# Patient Record
Sex: Male | Born: 1940
Health system: Southern US, Community
[De-identification: ages and names within clinical notes are randomized; demographics above are authoritative.]

## PROBLEM LIST (undated history)

## (undated) DIAGNOSIS — I2510 Atherosclerotic heart disease of native coronary artery without angina pectoris: Secondary | ICD-10-CM

## (undated) DIAGNOSIS — I6529 Occlusion and stenosis of unspecified carotid artery: Secondary | ICD-10-CM

## (undated) DIAGNOSIS — N200 Calculus of kidney: Secondary | ICD-10-CM

## (undated) DIAGNOSIS — I10 Essential (primary) hypertension: Secondary | ICD-10-CM

## (undated) DIAGNOSIS — K219 Gastro-esophageal reflux disease without esophagitis: Secondary | ICD-10-CM

## (undated) DIAGNOSIS — M1990 Unspecified osteoarthritis, unspecified site: Secondary | ICD-10-CM

## (undated) DIAGNOSIS — Z87442 Personal history of urinary calculi: Secondary | ICD-10-CM

## (undated) DIAGNOSIS — D509 Iron deficiency anemia, unspecified: Secondary | ICD-10-CM

## (undated) DIAGNOSIS — C801 Malignant (primary) neoplasm, unspecified: Secondary | ICD-10-CM

## (undated) DIAGNOSIS — I219 Acute myocardial infarction, unspecified: Secondary | ICD-10-CM

## (undated) DIAGNOSIS — J45909 Unspecified asthma, uncomplicated: Secondary | ICD-10-CM

## (undated) DIAGNOSIS — E785 Hyperlipidemia, unspecified: Secondary | ICD-10-CM

## (undated) HISTORY — PX: EYE SURGERY: SHX253

## (undated) HISTORY — PX: COLONOSCOPY: SHX174

## (undated) HISTORY — PX: COLON SURGERY: SHX602

## (undated) HISTORY — DX: Acute myocardial infarction, unspecified: I21.9

## (undated) HISTORY — DX: Calculus of kidney: N20.0

## (undated) HISTORY — DX: Essential (primary) hypertension: I10

## (undated) HISTORY — DX: Unspecified osteoarthritis, unspecified site: M19.90

## (undated) HISTORY — DX: Occlusion and stenosis of unspecified carotid artery: I65.29

## (undated) HISTORY — DX: Iron deficiency anemia, unspecified: D50.9

## (undated) HISTORY — PX: REPAIR OF COMPLEX TRACTION RETINAL DETACHMENT: SHX6217

## (undated) HISTORY — DX: Hyperlipidemia, unspecified: E78.5

## (undated) HISTORY — DX: Atherosclerotic heart disease of native coronary artery without angina pectoris: I25.10

## (undated) HISTORY — PX: CORONARY STENT PLACEMENT: SHX1402

## (undated) HISTORY — DX: Unspecified asthma, uncomplicated: J45.909

## (undated) HISTORY — PX: HERNIA REPAIR: SHX51

---

## 1999-06-13 ENCOUNTER — Ambulatory Visit (HOSPITAL_COMMUNITY): Admission: RE | Admit: 1999-06-13 | Discharge: 1999-06-13 | Payer: Self-pay | Admitting: Gastroenterology

## 1999-06-13 ENCOUNTER — Encounter (INDEPENDENT_AMBULATORY_CARE_PROVIDER_SITE_OTHER): Payer: Self-pay | Admitting: Specialist

## 2000-04-26 ENCOUNTER — Emergency Department (HOSPITAL_COMMUNITY): Admission: EM | Admit: 2000-04-26 | Discharge: 2000-04-26 | Payer: Self-pay | Admitting: Emergency Medicine

## 2000-04-26 ENCOUNTER — Encounter: Payer: Self-pay | Admitting: Emergency Medicine

## 2000-04-27 ENCOUNTER — Encounter: Payer: Self-pay | Admitting: Emergency Medicine

## 2000-12-10 ENCOUNTER — Ambulatory Visit (HOSPITAL_COMMUNITY): Admission: RE | Admit: 2000-12-10 | Discharge: 2000-12-10 | Payer: Self-pay | Admitting: Family Medicine

## 2000-12-10 ENCOUNTER — Encounter: Payer: Self-pay | Admitting: Family Medicine

## 2000-12-22 ENCOUNTER — Encounter: Payer: Self-pay | Admitting: Family Medicine

## 2000-12-22 ENCOUNTER — Ambulatory Visit (HOSPITAL_COMMUNITY): Admission: RE | Admit: 2000-12-22 | Discharge: 2000-12-22 | Payer: Self-pay | Admitting: Family Medicine

## 2001-07-06 ENCOUNTER — Ambulatory Visit (HOSPITAL_COMMUNITY): Admission: RE | Admit: 2001-07-06 | Discharge: 2001-07-06 | Payer: Self-pay | Admitting: Gastroenterology

## 2001-07-06 ENCOUNTER — Encounter (INDEPENDENT_AMBULATORY_CARE_PROVIDER_SITE_OTHER): Payer: Self-pay

## 2001-07-08 ENCOUNTER — Observation Stay (HOSPITAL_COMMUNITY): Admission: EM | Admit: 2001-07-08 | Discharge: 2001-07-09 | Payer: Self-pay | Admitting: *Deleted

## 2003-08-08 DIAGNOSIS — I2510 Atherosclerotic heart disease of native coronary artery without angina pectoris: Secondary | ICD-10-CM

## 2003-08-08 HISTORY — DX: Atherosclerotic heart disease of native coronary artery without angina pectoris: I25.10

## 2003-09-23 ENCOUNTER — Ambulatory Visit (HOSPITAL_COMMUNITY): Admission: RE | Admit: 2003-09-23 | Discharge: 2003-09-23 | Payer: Self-pay | Admitting: Internal Medicine

## 2003-09-30 ENCOUNTER — Encounter: Admission: RE | Admit: 2003-09-30 | Discharge: 2003-09-30 | Payer: Self-pay | Admitting: Vascular Surgery

## 2003-10-17 ENCOUNTER — Encounter (HOSPITAL_COMMUNITY): Admission: RE | Admit: 2003-10-17 | Discharge: 2004-01-15 | Payer: Self-pay | Admitting: Interventional Cardiology

## 2003-11-08 ENCOUNTER — Emergency Department (HOSPITAL_COMMUNITY): Admission: EM | Admit: 2003-11-08 | Discharge: 2003-11-08 | Payer: Self-pay | Admitting: Emergency Medicine

## 2003-11-16 ENCOUNTER — Ambulatory Visit (HOSPITAL_COMMUNITY): Admission: RE | Admit: 2003-11-16 | Discharge: 2003-11-16 | Payer: Self-pay | Admitting: Internal Medicine

## 2003-11-30 ENCOUNTER — Encounter (INDEPENDENT_AMBULATORY_CARE_PROVIDER_SITE_OTHER): Payer: Self-pay | Admitting: Specialist

## 2003-11-30 ENCOUNTER — Encounter (INDEPENDENT_AMBULATORY_CARE_PROVIDER_SITE_OTHER): Payer: Self-pay | Admitting: General Surgery

## 2003-11-30 ENCOUNTER — Inpatient Hospital Stay (HOSPITAL_COMMUNITY): Admission: RE | Admit: 2003-11-30 | Discharge: 2003-12-07 | Payer: Self-pay | Admitting: General Surgery

## 2003-12-06 ENCOUNTER — Ambulatory Visit: Payer: Self-pay | Admitting: Oncology

## 2003-12-27 ENCOUNTER — Inpatient Hospital Stay (HOSPITAL_COMMUNITY): Admission: EM | Admit: 2003-12-27 | Discharge: 2003-12-29 | Payer: Self-pay | Admitting: Oncology

## 2003-12-27 ENCOUNTER — Ambulatory Visit: Payer: Self-pay | Admitting: Oncology

## 2004-02-15 ENCOUNTER — Ambulatory Visit: Payer: Self-pay | Admitting: Oncology

## 2004-02-16 ENCOUNTER — Encounter (INDEPENDENT_AMBULATORY_CARE_PROVIDER_SITE_OTHER): Payer: Self-pay | Admitting: *Deleted

## 2004-02-16 ENCOUNTER — Ambulatory Visit: Payer: Self-pay | Admitting: Oncology

## 2004-02-16 ENCOUNTER — Ambulatory Visit (HOSPITAL_COMMUNITY): Admission: RE | Admit: 2004-02-16 | Discharge: 2004-02-16 | Payer: Self-pay | Admitting: Oncology

## 2004-06-08 ENCOUNTER — Ambulatory Visit: Payer: Self-pay | Admitting: Oncology

## 2004-06-13 ENCOUNTER — Ambulatory Visit (HOSPITAL_COMMUNITY): Admission: RE | Admit: 2004-06-13 | Discharge: 2004-06-13 | Payer: Self-pay | Admitting: Oncology

## 2004-11-09 ENCOUNTER — Ambulatory Visit: Payer: Self-pay | Admitting: Oncology

## 2005-03-11 ENCOUNTER — Ambulatory Visit: Payer: Self-pay | Admitting: Oncology

## 2005-03-14 ENCOUNTER — Ambulatory Visit (HOSPITAL_COMMUNITY): Admission: RE | Admit: 2005-03-14 | Discharge: 2005-03-14 | Payer: Self-pay | Admitting: Oncology

## 2005-05-07 ENCOUNTER — Ambulatory Visit: Payer: Self-pay | Admitting: Oncology

## 2005-05-08 LAB — COMPREHENSIVE METABOLIC PANEL
AST: 18 U/L (ref 0–37)
Albumin: 4.2 g/dL (ref 3.5–5.2)
Alkaline Phosphatase: 63 U/L (ref 39–117)
BUN: 20 mg/dL (ref 6–23)
Glucose, Bld: 111 mg/dL — ABNORMAL HIGH (ref 70–99)
Potassium: 4.7 mEq/L (ref 3.5–5.3)
Sodium: 136 mEq/L (ref 135–145)
Total Bilirubin: 0.4 mg/dL (ref 0.3–1.2)

## 2005-05-08 LAB — CBC WITH DIFFERENTIAL/PLATELET
Basophils Absolute: 0 10*3/uL (ref 0.0–0.1)
EOS%: 0.6 % (ref 0.0–7.0)
Eosinophils Absolute: 0 10*3/uL (ref 0.0–0.5)
LYMPH%: 31.4 % (ref 14.0–48.0)
MCH: 29.6 pg (ref 28.0–33.4)
MCV: 88 fL (ref 81.6–98.0)
MONO%: 9.1 % (ref 0.0–13.0)
Platelets: 217 10*3/uL (ref 145–400)
RBC: 4.81 10*6/uL (ref 4.20–5.71)
RDW: 13.9 % (ref 11.2–14.6)

## 2005-05-08 LAB — LIPID PANEL
Cholesterol: 139 mg/dL (ref 0–200)
HDL: 31 mg/dL — ABNORMAL LOW (ref >39)
LDL Cholesterol: 89 mg/dL (ref 0–99)
Total CHOL/HDL Ratio: 4.5 Ratio
Triglycerides: 97 mg/dL (ref <150)
VLDL: 19 mg/dL (ref 0–40)

## 2005-10-25 ENCOUNTER — Ambulatory Visit: Payer: Self-pay | Admitting: Oncology

## 2005-10-29 LAB — COMPREHENSIVE METABOLIC PANEL
ALT: 23 U/L (ref 0–40)
AST: 20 U/L (ref 0–37)
Albumin: 3.9 g/dL (ref 3.5–5.2)
Alkaline Phosphatase: 59 U/L (ref 39–117)
Calcium: 9.1 mg/dL (ref 8.4–10.5)
Chloride: 106 mEq/L (ref 96–112)
Potassium: 4.6 mEq/L (ref 3.5–5.3)
Sodium: 139 mEq/L (ref 135–145)

## 2005-10-29 LAB — CBC WITH DIFFERENTIAL/PLATELET
BASO%: 0.6 % (ref 0.0–2.0)
EOS%: 0.7 % (ref 0.0–7.0)
HGB: 13.5 g/dL (ref 13.0–17.1)
MCH: 29.4 pg (ref 28.0–33.4)
MCHC: 33.3 g/dL (ref 32.0–35.9)
MONO%: 8.2 % (ref 0.0–13.0)
RBC: 4.59 10*6/uL (ref 4.20–5.71)
RDW: 14.4 % (ref 11.2–14.6)
lymph#: 1.3 10*3/uL (ref 0.9–3.3)

## 2006-04-03 ENCOUNTER — Ambulatory Visit: Payer: Self-pay | Admitting: Oncology

## 2006-04-07 ENCOUNTER — Ambulatory Visit (HOSPITAL_COMMUNITY): Admission: RE | Admit: 2006-04-07 | Discharge: 2006-04-07 | Payer: Self-pay | Admitting: Oncology

## 2006-04-08 LAB — CBC WITH DIFFERENTIAL/PLATELET
BASO%: 0.3 % (ref 0.0–2.0)
Basophils Absolute: 0 10*3/uL (ref 0.0–0.1)
EOS%: 0.8 % (ref 0.0–7.0)
MCH: 29.1 pg (ref 28.0–33.4)
MCHC: 33.9 g/dL (ref 32.0–35.9)
MCV: 85.8 fL (ref 81.6–98.0)
MONO%: 8.4 % (ref 0.0–13.0)
RBC: 4.9 10*6/uL (ref 4.20–5.71)
RDW: 15.4 % — ABNORMAL HIGH (ref 11.2–14.6)
lymph#: 1.6 10*3/uL (ref 0.9–3.3)

## 2006-04-08 LAB — COMPREHENSIVE METABOLIC PANEL
ALT: 22 U/L (ref 0–53)
AST: 17 U/L (ref 0–37)
Albumin: 4.2 g/dL (ref 3.5–5.2)
Alkaline Phosphatase: 65 U/L (ref 39–117)
BUN: 19 mg/dL (ref 6–23)
Calcium: 9.3 mg/dL (ref 8.4–10.5)
Chloride: 105 mEq/L (ref 96–112)
Creatinine, Ser: 0.91 mg/dL (ref 0.40–1.50)
Potassium: 5.1 mEq/L (ref 3.5–5.3)

## 2006-04-08 LAB — CEA: CEA: 1 ng/mL (ref 0.0–5.0)

## 2006-08-03 ENCOUNTER — Inpatient Hospital Stay (HOSPITAL_COMMUNITY): Admission: EM | Admit: 2006-08-03 | Discharge: 2006-08-05 | Payer: Self-pay | Admitting: Emergency Medicine

## 2006-08-05 ENCOUNTER — Ambulatory Visit: Payer: Self-pay | Admitting: Vascular Surgery

## 2006-11-10 ENCOUNTER — Ambulatory Visit: Payer: Self-pay | Admitting: Oncology

## 2006-11-10 LAB — CBC WITH DIFFERENTIAL/PLATELET
BASO%: 0.7 % (ref 0.0–2.0)
Basophils Absolute: 0.1 10*3/uL (ref 0.0–0.1)
EOS%: 0.5 % (ref 0.0–7.0)
HGB: 13 g/dL (ref 13.0–17.1)
MCH: 28.5 pg (ref 28.0–33.4)
MCHC: 33.6 g/dL (ref 32.0–35.9)
MONO%: 9.9 % (ref 0.0–13.0)
RBC: 4.57 10*6/uL (ref 4.20–5.71)
RDW: 12 % (ref 11.2–14.6)
lymph#: 1.5 10*3/uL (ref 0.9–3.3)

## 2006-11-10 LAB — COMPREHENSIVE METABOLIC PANEL
ALT: 14 U/L (ref 0–53)
AST: 14 U/L (ref 0–37)
Albumin: 4 g/dL (ref 3.5–5.2)
Alkaline Phosphatase: 64 U/L (ref 39–117)
Calcium: 9 mg/dL (ref 8.4–10.5)
Chloride: 107 mEq/L (ref 96–112)
Potassium: 4.9 mEq/L (ref 3.5–5.3)
Sodium: 140 mEq/L (ref 135–145)
Total Protein: 6.4 g/dL (ref 6.0–8.3)

## 2007-03-05 ENCOUNTER — Encounter: Admission: RE | Admit: 2007-03-05 | Discharge: 2007-03-05 | Payer: Self-pay | Admitting: Internal Medicine

## 2007-05-27 ENCOUNTER — Ambulatory Visit: Payer: Self-pay | Admitting: Oncology

## 2008-06-01 ENCOUNTER — Ambulatory Visit: Payer: Self-pay | Admitting: Oncology

## 2008-06-03 LAB — COMPREHENSIVE METABOLIC PANEL
ALT: 16 U/L (ref 0–53)
AST: 15 U/L (ref 0–37)
Alkaline Phosphatase: 68 U/L (ref 39–117)
CO2: 24 mEq/L (ref 19–32)
Sodium: 142 mEq/L (ref 135–145)
Total Bilirubin: 0.4 mg/dL (ref 0.3–1.2)
Total Protein: 6.3 g/dL (ref 6.0–8.3)

## 2008-06-03 LAB — MORPHOLOGY: PLT EST: ADEQUATE

## 2008-06-03 LAB — CBC WITH DIFFERENTIAL/PLATELET
Eosinophils Absolute: 0.1 10*3/uL (ref 0.0–0.5)
LYMPH%: 28.8 % (ref 14.0–49.0)
MONO#: 0.7 10*3/uL (ref 0.1–0.9)
NEUT#: 4.2 10*3/uL (ref 1.5–6.5)
Platelets: 239 10*3/uL (ref 140–400)
RBC: 4.73 10*6/uL (ref 4.20–5.82)
WBC: 7 10*3/uL (ref 4.0–10.3)

## 2008-09-05 ENCOUNTER — Ambulatory Visit: Payer: Self-pay | Admitting: Oncology

## 2008-09-05 LAB — CBC WITH DIFFERENTIAL/PLATELET
Basophils Absolute: 0 10*3/uL (ref 0.0–0.1)
Eosinophils Absolute: 0 10*3/uL (ref 0.0–0.5)
HCT: 39.2 % (ref 38.4–49.9)
HGB: 12.3 g/dL — ABNORMAL LOW (ref 13.0–17.1)
LYMPH%: 28.3 % (ref 14.0–49.0)
MONO#: 0.6 10*3/uL (ref 0.1–0.9)
NEUT#: 4 10*3/uL (ref 1.5–6.5)
Platelets: 245 10*3/uL (ref 140–400)
RBC: 5.04 10*6/uL (ref 4.20–5.82)
WBC: 6.4 10*3/uL (ref 4.0–10.3)

## 2008-09-05 LAB — COMPREHENSIVE METABOLIC PANEL
Albumin: 3.9 g/dL (ref 3.5–5.2)
BUN: 17 mg/dL (ref 6–23)
CO2: 20 mEq/L (ref 19–32)
Calcium: 9.2 mg/dL (ref 8.4–10.5)
Chloride: 107 mEq/L (ref 96–112)
Creatinine, Ser: 0.97 mg/dL (ref 0.40–1.50)
Potassium: 4.7 mEq/L (ref 3.5–5.3)

## 2008-09-05 LAB — MORPHOLOGY

## 2008-09-05 LAB — IRON AND TIBC
Iron: 42 ug/dL (ref 42–165)
UIBC: 340 ug/dL

## 2008-09-05 LAB — LACTATE DEHYDROGENASE: LDH: 136 U/L (ref 94–250)

## 2008-09-05 LAB — FERRITIN: Ferritin: 6 ng/mL — ABNORMAL LOW (ref 22–322)

## 2008-12-06 ENCOUNTER — Ambulatory Visit: Payer: Self-pay | Admitting: Oncology

## 2008-12-09 LAB — CBC WITH DIFFERENTIAL/PLATELET
BASO%: 0.5 % (ref 0.0–2.0)
EOS%: 0.7 % (ref 0.0–7.0)
HCT: 39.4 % (ref 38.4–49.9)
HGB: 12.5 g/dL — ABNORMAL LOW (ref 13.0–17.1)
MCH: 27 pg — ABNORMAL LOW (ref 27.2–33.4)
MCHC: 31.7 g/dL — ABNORMAL LOW (ref 32.0–36.0)
MONO#: 0.6 10*3/uL (ref 0.1–0.9)
RDW: 16.2 % — ABNORMAL HIGH (ref 11.0–14.6)
WBC: 5.8 10*3/uL (ref 4.0–10.3)
lymph#: 1.6 10*3/uL (ref 0.9–3.3)

## 2008-12-09 LAB — COMPREHENSIVE METABOLIC PANEL
AST: 11 U/L (ref 0–37)
Albumin: 4 g/dL (ref 3.5–5.2)
Alkaline Phosphatase: 61 U/L (ref 39–117)
BUN: 20 mg/dL (ref 6–23)
Creatinine, Ser: 0.95 mg/dL (ref 0.40–1.50)
Glucose, Bld: 130 mg/dL — ABNORMAL HIGH (ref 70–99)
Total Bilirubin: 0.4 mg/dL (ref 0.3–1.2)

## 2008-12-09 LAB — MORPHOLOGY: PLT EST: ADEQUATE

## 2009-04-05 ENCOUNTER — Encounter: Admission: RE | Admit: 2009-04-05 | Discharge: 2009-04-05 | Payer: Self-pay | Admitting: Gastroenterology

## 2009-06-08 ENCOUNTER — Ambulatory Visit: Payer: Self-pay | Admitting: Oncology

## 2009-06-09 LAB — CBC WITH DIFFERENTIAL/PLATELET
Basophils Absolute: 0 10*3/uL (ref 0.0–0.1)
Eosinophils Absolute: 0.1 10*3/uL (ref 0.0–0.5)
HCT: 40.1 % (ref 38.4–49.9)
LYMPH%: 20.9 % (ref 14.0–49.0)
MCV: 86.2 fL (ref 79.3–98.0)
MONO#: 0.5 10*3/uL (ref 0.1–0.9)
MONO%: 7.6 % (ref 0.0–14.0)
NEUT#: 4.4 10*3/uL (ref 1.5–6.5)
NEUT%: 70 % (ref 39.0–75.0)
Platelets: 230 10*3/uL (ref 140–400)
RBC: 4.65 10*6/uL (ref 4.20–5.82)
WBC: 6.2 10*3/uL (ref 4.0–10.3)

## 2009-06-09 LAB — LACTATE DEHYDROGENASE: LDH: 117 U/L (ref 94–250)

## 2009-06-09 LAB — COMPREHENSIVE METABOLIC PANEL
ALT: 11 U/L (ref 0–53)
Albumin: 3.9 g/dL (ref 3.5–5.2)
CO2: 25 mEq/L (ref 19–32)
Chloride: 107 mEq/L (ref 96–112)
Glucose, Bld: 132 mg/dL — ABNORMAL HIGH (ref 70–99)
Potassium: 4.7 mEq/L (ref 3.5–5.3)
Sodium: 139 mEq/L (ref 135–145)
Total Bilirubin: 0.4 mg/dL (ref 0.3–1.2)
Total Protein: 6.1 g/dL (ref 6.0–8.3)

## 2009-06-09 LAB — MORPHOLOGY: PLT EST: ADEQUATE

## 2009-09-25 ENCOUNTER — Encounter: Admission: RE | Admit: 2009-09-25 | Discharge: 2009-09-25 | Payer: Self-pay | Admitting: Internal Medicine

## 2009-12-22 ENCOUNTER — Ambulatory Visit: Payer: Self-pay | Admitting: Oncology

## 2010-01-25 ENCOUNTER — Ambulatory Visit: Payer: Self-pay | Admitting: Oncology

## 2010-01-28 ENCOUNTER — Encounter: Payer: Self-pay | Admitting: Oncology

## 2010-01-29 LAB — COMPREHENSIVE METABOLIC PANEL
ALT: 14 U/L (ref 0–53)
AST: 16 U/L (ref 0–37)
Albumin: 3.9 g/dL (ref 3.5–5.2)
Alkaline Phosphatase: 60 U/L (ref 39–117)
BUN: 13 mg/dL (ref 6–23)
CO2: 24 mEq/L (ref 19–32)
Calcium: 9.1 mg/dL (ref 8.4–10.5)
Chloride: 106 mEq/L (ref 96–112)
Creatinine, Ser: 0.91 mg/dL (ref 0.40–1.50)
Glucose, Bld: 132 mg/dL — ABNORMAL HIGH (ref 70–99)
Potassium: 4.3 mEq/L (ref 3.5–5.3)
Sodium: 139 mEq/L (ref 135–145)
Total Bilirubin: 0.4 mg/dL (ref 0.3–1.2)
Total Protein: 6 g/dL (ref 6.0–8.3)

## 2010-01-29 LAB — URINALYSIS, MICROSCOPIC - CHCC
Bilirubin (Urine): NEGATIVE
Blood: NEGATIVE
Glucose: NEGATIVE g/dL
Ketones: NEGATIVE mg/dL
Leukocyte Esterase: NEGATIVE
Nitrite: NEGATIVE
Protein: NEGATIVE mg/dL
Specific Gravity, Urine: 1.03 (ref 1.003–1.035)
pH: 6 (ref 4.6–8.0)

## 2010-01-29 LAB — MORPHOLOGY: PLT EST: ADEQUATE

## 2010-01-29 LAB — IRON AND TIBC
%SAT: 11 % — ABNORMAL LOW (ref 20–55)
Iron: 38 ug/dL — ABNORMAL LOW (ref 42–165)
TIBC: 340 ug/dL (ref 215–435)
UIBC: 302 ug/dL

## 2010-01-29 LAB — CBC WITH DIFFERENTIAL/PLATELET
BASO%: 0.5 % (ref 0.0–2.0)
Basophils Absolute: 0 10*3/uL (ref 0.0–0.1)
EOS%: 0.7 % (ref 0.0–7.0)
Eosinophils Absolute: 0 10*3/uL (ref 0.0–0.5)
HCT: 37.5 % — ABNORMAL LOW (ref 38.4–49.9)
HGB: 12.4 g/dL — ABNORMAL LOW (ref 13.0–17.1)
LYMPH%: 25.1 % (ref 14.0–49.0)
MCH: 27.5 pg (ref 27.2–33.4)
MCHC: 33 g/dL (ref 32.0–36.0)
MCV: 83.6 fL (ref 79.3–98.0)
MONO#: 0.5 10*3/uL (ref 0.1–0.9)
MONO%: 7.7 % (ref 0.0–14.0)
NEUT#: 4 10*3/uL (ref 1.5–6.5)
NEUT%: 66 % (ref 39.0–75.0)
Platelets: 227 10*3/uL (ref 140–400)
RBC: 4.49 10*6/uL (ref 4.20–5.82)
RDW: 16.3 % — ABNORMAL HIGH (ref 11.0–14.6)
WBC: 6 10*3/uL (ref 4.0–10.3)
lymph#: 1.5 10*3/uL (ref 0.9–3.3)

## 2010-01-29 LAB — LACTATE DEHYDROGENASE: LDH: 110 U/L (ref 94–250)

## 2010-05-03 ENCOUNTER — Other Ambulatory Visit: Payer: Self-pay | Admitting: Urology

## 2010-05-03 ENCOUNTER — Other Ambulatory Visit (HOSPITAL_COMMUNITY): Payer: Self-pay | Admitting: Urology

## 2010-05-03 ENCOUNTER — Other Ambulatory Visit: Payer: Self-pay | Admitting: Anesthesiology

## 2010-05-03 ENCOUNTER — Encounter (HOSPITAL_COMMUNITY): Payer: Medicare Other

## 2010-05-03 ENCOUNTER — Ambulatory Visit (HOSPITAL_COMMUNITY)
Admission: RE | Admit: 2010-05-03 | Discharge: 2010-05-03 | Disposition: A | Discharge status: Home / Self Care | Payer: Medicare Other | Source: Ambulatory Visit | Attending: Urology | Admitting: Urology

## 2010-05-03 DIAGNOSIS — N201 Calculus of ureter: Secondary | ICD-10-CM | POA: Insufficient documentation

## 2010-05-03 DIAGNOSIS — Z01818 Encounter for other preprocedural examination: Secondary | ICD-10-CM | POA: Insufficient documentation

## 2010-05-03 DIAGNOSIS — Z01810 Encounter for preprocedural cardiovascular examination: Secondary | ICD-10-CM | POA: Insufficient documentation

## 2010-05-03 DIAGNOSIS — I10 Essential (primary) hypertension: Secondary | ICD-10-CM | POA: Insufficient documentation

## 2010-05-03 DIAGNOSIS — E119 Type 2 diabetes mellitus without complications: Secondary | ICD-10-CM | POA: Insufficient documentation

## 2010-05-03 DIAGNOSIS — Z01812 Encounter for preprocedural laboratory examination: Secondary | ICD-10-CM | POA: Insufficient documentation

## 2010-05-03 LAB — SURGICAL PCR SCREEN
MRSA, PCR: INVALID — AB
Staphylococcus aureus: INVALID — AB

## 2010-05-03 LAB — BASIC METABOLIC PANEL
BUN: 14 mg/dL (ref 6–23)
Calcium: 9 mg/dL (ref 8.4–10.5)
GFR calc non Af Amer: 39 mL/min — ABNORMAL LOW (ref >60)
Glucose, Bld: 134 mg/dL — ABNORMAL HIGH (ref 70–99)
Potassium: 4.8 mEq/L (ref 3.5–5.1)
Sodium: 138 mEq/L (ref 135–145)

## 2010-05-03 LAB — APTT: aPTT: 40 seconds — ABNORMAL HIGH (ref 24–37)

## 2010-05-03 LAB — CBC
HCT: 36.3 % — ABNORMAL LOW (ref 39.0–52.0)
MCHC: 31.4 g/dL (ref 30.0–36.0)
Platelets: 244 10*3/uL (ref 150–400)
RDW: 15.7 % — ABNORMAL HIGH (ref 11.5–15.5)
WBC: 10.3 10*3/uL (ref 4.0–10.5)

## 2010-05-03 LAB — PROTIME-INR: Prothrombin Time: 14.2 seconds (ref 11.6–15.2)

## 2010-05-04 ENCOUNTER — Ambulatory Visit (HOSPITAL_COMMUNITY)
Admission: RE | Admit: 2010-05-04 | Discharge: 2010-05-04 | Disposition: A | Discharge status: Home / Self Care | Payer: Medicare Other | Source: Ambulatory Visit | Attending: Urology | Admitting: Urology

## 2010-05-04 DIAGNOSIS — C8589 Other specified types of non-Hodgkin lymphoma, extranodal and solid organ sites: Secondary | ICD-10-CM | POA: Insufficient documentation

## 2010-05-04 DIAGNOSIS — I252 Old myocardial infarction: Secondary | ICD-10-CM | POA: Insufficient documentation

## 2010-05-04 DIAGNOSIS — I2510 Atherosclerotic heart disease of native coronary artery without angina pectoris: Secondary | ICD-10-CM | POA: Insufficient documentation

## 2010-05-04 DIAGNOSIS — Z7982 Long term (current) use of aspirin: Secondary | ICD-10-CM | POA: Insufficient documentation

## 2010-05-04 DIAGNOSIS — N201 Calculus of ureter: Secondary | ICD-10-CM | POA: Insufficient documentation

## 2010-05-04 DIAGNOSIS — Z85038 Personal history of other malignant neoplasm of large intestine: Secondary | ICD-10-CM | POA: Insufficient documentation

## 2010-05-04 DIAGNOSIS — Z7902 Long term (current) use of antithrombotics/antiplatelets: Secondary | ICD-10-CM | POA: Insufficient documentation

## 2010-05-04 DIAGNOSIS — E119 Type 2 diabetes mellitus without complications: Secondary | ICD-10-CM | POA: Insufficient documentation

## 2010-05-04 DIAGNOSIS — I6529 Occlusion and stenosis of unspecified carotid artery: Secondary | ICD-10-CM | POA: Insufficient documentation

## 2010-05-04 DIAGNOSIS — Z85828 Personal history of other malignant neoplasm of skin: Secondary | ICD-10-CM | POA: Insufficient documentation

## 2010-05-04 DIAGNOSIS — Z79899 Other long term (current) drug therapy: Secondary | ICD-10-CM | POA: Insufficient documentation

## 2010-05-04 DIAGNOSIS — I739 Peripheral vascular disease, unspecified: Secondary | ICD-10-CM | POA: Insufficient documentation

## 2010-05-04 LAB — GLUCOSE, CAPILLARY
Glucose-Capillary: 138 mg/dL — ABNORMAL HIGH (ref 70–99)
Glucose-Capillary: 139 mg/dL — ABNORMAL HIGH (ref 70–99)

## 2010-05-06 LAB — MRSA CULTURE

## 2010-05-08 DIAGNOSIS — N200 Calculus of kidney: Secondary | ICD-10-CM

## 2010-05-08 HISTORY — DX: Calculus of kidney: N20.0

## 2010-05-08 NOTE — Op Note (Signed)
NAME:  Mark West, Mark West NO.:  [id number]  MEDICAL RECORD NO.:  [id number]           PATIENT TYPE:  O  LOCATION:  XRAY                         FACILITY:  [facility]  PHYSICIAN:  Betul Brisky C. Vernie Ammons, M.D.  DATE OF BIRTH:  14-Sep-1940  DATE OF PROCEDURE: DATE OF DISCHARGE:                              OPERATIVE REPORT   PREOPERATIVE DIAGNOSIS:  Left ureteral calculus.  POSTOPERATIVE DIAGNOSIS:  Left ureteral calculus.  PROCEDURES: 1. Cystoscopy with left retrograde pyelogram including interpretation. 2. Left ureteroscopy. 3. Laser lithotripsy. 4. Ureteroscopic stone extraction. 5. Left double-J stent placement.  SURGEON:  Kosha Jaquith C. Vernie Ammons, M.D.  ANESTHESIA:  General.  BLOOD LOSS:  Minimal.  DRAINS:  5-French, 26-cm Polaris stent (string attached).  SPECIMEN:  Stone given to the patient.  COMPLICATIONS:  None.  INDICATIONS:  The patient is 70 year old male with a diagnosis of left ureteral calculus, who initially had severe left flank pain and was seen in an emergency room in [place] where a 4-mm left ureteral stone was noted.  He was placed on medical expulsive therapy and followed up with me, continuing to have pain, and we therefore discussed the treatment options.  His stone could not be definitively visualized on KUB and therefore, ureteroscopy was recommended.  I have gone over the procedure with him in detail including the risks and complications.  He understands and has elected to proceed.  DESCRIPTION OF OPERATION:  After informed consent, the patient was brought to the major OR, placed on table, administered general anesthesia and then moved to the dorsal lithotomy position.  His genitalia was sterilely prepped and draped with Hibiclens since he does have an IODINE allergy.  An official time-out was then performed.  The 22-French rigid cystoscope with 12-degree lens was then passed under direct vision down the urethra which was noted to be entirely  normal. The prostatic urethra revealed some trilobar hypertrophy but no prostatic lesions were identified.  The bladder was then entered and fully inspected in a systematic fashion and noted to be free of any tumor, stones or inflammatory lesions.  Ureteral orifices were of normal configuration and position.  The left ureteral orifice was identified and a 6-French open-ended ureteral catheter was then passed through the cystoscope and into the left ureteral orifice.  A left retrograde pyelogram was then performed in standard fashion.  I injected full strength contrast through the open- ended catheter and up the left ureter under direct fluoroscopic visualization, noting a filling defect in the distal ureter and some proximal dilatation of the ureter but no proximal filling defects and the intrarenal collecting system was also noted be normal.  I, therefore, passed a 0.038-inch floppy tip guidewire through the open- ended catheter in to the area of the renal pelvis under fluoroscopy and then dilated the intramural ureter initially with the inner portion of the ureteral access sheath followed by the outer portion together with the inner portion, which gently dilated the intramural ureter.  6-French rigid ureteroscope was then passed under direct vision into the left ureteral orifice and easily up the ureter a short distance where the stone was identified.  I passed the  BackStop catheter beyond the stone about 1 cm and then injected full 10 mL of BackStop material to prevent proximal migration of the stone and fragments during treatment. I then used a 200 micron holmium laser fiber to fragment the stone.  The nitinol basket was then used to grasp all the stone fragments and extract those.  Reinspection of the ureter revealed no injury and because of stent was placed, the BackStop material was not dissolved at the time of surgery.  The cystoscope was back loaded over the guidewire and the  Polaris stent was then passed over the guidewire with good curl being noted in the renal pelvis, as I removed the guidewire.  The bladder was then drained, the cystoscope removed and the tether on the distal aspect of the stent was affixed to the dorsum of the penis.  The patient was awakened and taken to recovery room in stable and satisfactory condition.  He tolerated procedure well and there are no intraoperative complications. He will be given a prescription for Pyridium 200 mg #30 and Vicodin HP #30 with followup in my office for stent removal in 1 week.  Written discharge instructions were given as well.     Samarrah Tranchina C. Vernie Ammons, M.D.     MCO/MEDQ  D:  05/04/2010  T:  05/04/2010  Job:  [id number]  Electronically Signed by Ihor Gully M.D. on 05/08/2010 04:12:37 AM

## 2010-05-22 NOTE — H&P (Signed)
NAME:  Mark West, PETRENKO NO.:  [id number]   MEDICAL RECORD NO.:  [id number]          PATIENT TYPE:  INP   LOCATION:  1406                         FACILITY:  [facility]   PHYSICIAN:  Hollice Espy, M.D.DATE OF BIRTH:  26-May-1940   DATE OF ADMISSION:  08/03/2006  DATE OF DISCHARGE:                              HISTORY & PHYSICAL   PRIMARY CARE PHYSICIAN:  Georgann Housekeeper, M.D.   CHIEF COMPLAINT:  Syncope.   HISTORY OF PRESENT ILLNESS:  The patient is a 70 year old white male  with a past medical history of colon cancer, treated, low-grade  lymphoma, right side complete carotid artery stenosis, coronary artery  disease, and diabetes mellitus, who has been in generally good health  although he states for the last few days he has been having problems  where he feels a little unsteady on his feet.  He has had no previous  syncopal episodes.  Today he then woke up and said that he was feeling  very shaky and not too solid on his feet.  He did, however, take all of  his medications including his blood pressure medications.  He tells me  he occasionally checks his blood sugars and they have usually been in  the 120s.  His appetite has been good.  He then went out this afternoon  to play golf and was out in the hot sun and said that he kept noticing  that he was sweating profusely, more so than usual, telling his friend  that he was quite hot and at some point in the afternoon he slumped over  and passed out.  He said he had no immediate warning signs that he was  about to pass out but he did notice a few hours before he passed out  that he had some problems with blurry vision in his right eye.  His  friends immediately tended to him they called for help and poured water  on him.  I spoke to one of his friends who was with him and he said that  there was no way that he was out for more than 30 seconds.  There was no  shaking while he was unconscious, no loss of bowel or  bladder and when  he came to he seemed to be relatively alert and oriented.   The patient was then brought over by EMS.  Reportedly when he came into  the emergency room he was hypotensive with a blood pressure of 83/48 and  he was started on IV fluids.  The rest of his laboratory studies are  essentially unremarkable although it is noted that during his time of  his hypotension he was not tachycardic but the heart rate initially was  78 though again noted he is on metoprolol.  He was given IV fluids.  The  laboratory studies were ordered on the patient and the only laboratory  studies of concern were a normal white count but with an 80% shift and  elevated BUN of 24 and a creatinine of 1.7.  The patient had a normal  albumin.  The rest of his laboratory work  was unremarkable, however, he  was noted to have a slightly elevated D-Dimer of 0.6.  [facility] were called to evaluate the patient and for possible  admission.  A VQ scan was ordered by the emergency room attending and  when the hospitalist arrived at approximately 6:30 to admit the patient,  the patient was being taken over to the VQ scanner.  The daytime  hospitalist then decided to follow up with the patient after the VQ scan  and called the nighttime hospitalist who is myself to see the patient  after he returned.   After the patient returned, he has been since then fully alert and  oriented with no complaints other than some generalized fatigue.  He  denies any headaches, vision changes, dysphagia, chest pain,  palpitation, shortness of breath, wheezing, coughing, abdominal pain,  hematuria, dysuria, constipation, diarrhea, focal extremity numbness,  weakness, or pain.  The review of systems is otherwise negative.  He  tells me that he has been unable to void but not because he feels like  he has to go but because he just does not feel like he has to go.  He  has been unable to produce a urine specimen.   PAST  MEDICAL HISTORY:  The patient's past medical history includes a  complete right sided carotid artery stenosis, coronary artery disease,  status post coronary artery bypass graft, and a recent stent three years  ago, history of prostate nodule, history of ventral hernia repair with  mesh, history of colon cancer, status post resection and chemotherapy,  low-grade follicular lymphoma, being currently monitored, history of  hypertension, and diabetes mellitus as well as a history of a  penetrating trauma to the right eye as a child which has left him with  problems with decreased vision in that eye.   MEDICATIONS:  He is on metformin 500 p.o. b.i.d., aspirin, Plavix,  Lipitor, lisinopril, and metoprolol b.i.d.   ALLERGIES:  He has allergies to IV contrast.   SOCIAL HISTORY:  He denies any tobacco, heavy alcohol, or drug use.   FAMILY HISTORY:  The family history is noncontributory.   PHYSICAL EXAMINATION:  VITAL SIGNS:  The patient's vital signs on  admission showed a temperature of 98.9, heart rate of 78, blood pressure  of 83/48 initially, over time with fluids his blood pressure has come up  four or five hours later to 120/55, respirations of 18, O2 saturation of  98% on room air.  GENERAL:  In general the patient is alert and oriented times three,  currently in no apparent distress.  HEENT:  Normocephalic, atraumatic, his mucous membranes are dry.  He has  no carotid bruits.  HEART:  His heart is a regular rate and rhythm, S1 and S2 but very soft.  LUNGS:  His lungs are clear to auscultation bilaterally.  ABDOMEN:  The abdomen is soft, nontender, obese, nondistended, positive  bowel sounds.  EXTREMITIES:  The extremities show no clubbing, cyanosis, trace pitting  edema, 1+ peripheral pulses.   LABORATORY WORK:  Sodium of 138, potassium of 5.5, chloride of 107,  bicarb of 23, BUN of 24, creatinine of 1.7, glucose of 172.  LFTs are  unremarkable.  He has a white count of 9.9 but  with an 80% shift.  H and  H is 14.2 and 42, MCV of 85, platelet count of 260.  CPK is 79, MB less  than 1, troponin I less than 0.05.  D-Dimer is 0.6.  PT  of 13.2, INR of  1, PTT of 26, CPK of 85.  His chest x-ray is completely unremarkable.  His VQ scan done for his contrast allergies also was completely  unremarkable, low probability of a pulmonary embolus.  His EKG is not on  the chart but is reported by the emergency room attending as a normal  sinus rhythm.  According to rhythm strips he is in a normal sinus  rhythm.   ASSESSMENT AND PLAN:  1. Syncope:  The most likely thing is that the syncope is caused by      dehydration, probably worsened by the fact that he took his blood      pressure medications.  Will plan to treat with IV hydration,      orthostatic blood pressures q. shift.  Will also plan to find an      underlying cause as because of his noted differential on his white      count, he may have a urinary tract infection.  Once he is hydrated,      will check a urine specimen.  2. Acute renal failure, please see number one.  3. Diabetes mellitus:  No reports of hypoglycemia by paramedics.  Will      check a hemoglobin A1C, put on sliding scale, continue metformin.  4. Coronary artery disease:  Will hold his antihypertensives until his      blood pressure is normalized, continue      aspirin and Plavix.  5. History of right sided carotid artery stenosis:  To be completely      thorough will check a left sided carotid artery Doppler.      Hollice Espy, M.D.  Electronically Signed     SKK/MEDQ  D:  08/03/2006  T:  08/03/2006  Job:  [id number]   cc:   Hollice Espy, M.D.  Georgann Housekeeper, MD

## 2010-05-22 NOTE — Discharge Summary (Signed)
NAME:  BREVAN, LUBERTO NO.:  [id number]   MEDICAL RECORD NO.:  [id number]          PATIENT TYPE:  INP   LOCATION:  1406                         [facility]AC[facility]   PHYSICIAN:  Theressa Millard, M.D.    DATE OF BIRTH:  1940/09/05   DATE OF ADMISSION:  08/03/2006  DATE OF DISCHARGE:  08/05/2006                               DISCHARGE SUMMARY   ADMITTING DIAGNOSIS:  Syncope.   DISCHARGE DIAGNOSES:  1. Syncope secondary to dehydration.  2. Diabetes mellitus.  3. Hypertension.  4. History of colon cancer.  5. History of low grade follicular lymphoma.   The patient is a 70 year old white male who had a syncopal episode on  the golf [place] on the day of admission.  He felt hot much of the day,  was sweating profusely and without much warning had an episode of  syncope.  Does report that when he stands up in the morning, getting out  of bed, he often feels lightheaded for a few moments and this can happen  at other times during the day when he gets up from a sitting position.   HOSPITAL COURSE:  The patient was admitted.  Initial laboratory data  showed mild evidence of dehydration with a BUN of 24 and creatinine of  1.7.  With IV fluid these improved to 20 and 1.05, respectively.  Initially, his hemoglobin was 14.2 and fell to 12.5 with rehydration.  With vigorous rehydration, the patient felt much better.  IV fluids were  discontinued.  The patient ambulated without problem.  He felt a much  better and had no orthostatic dizziness.  At the time of admission, his  metoprolol and lisinopril were held.  At discharge lisinopril will be  resumed but metoprolol will be held until seen in follow-up by his  physician, Dr. Donette Larry.   We obtained a carotid Doppler to be sure that his left carotid artery  was without problem and no lesions were noted.  He does have a total  right carotid occlusion.   DISCHARGE MEDICATIONS:  1. Lipitor 40 mg daily.  2. Plavix 75 mg daily.  3.  Lisinopril 20 mg daily.  4. Aspirin 325 mg daily.  5. Metformin 500 mg daily.  Hold metoprolol for now.   DISCHARGE INSTRUCTIONS:  1. Activity:  No restrictions.  He is not to drive out of town for at      least 48 hours to be sure that he has no      recurring symptoms at home.  2. Diet:  No added salt.   FOLLOW UP:  He will call to make an appointment in 2-3 weeks to see Dr.  Donette Larry.      Theressa Millard, M.D.  Electronically Signed     JO/MEDQ  D:  08/05/2006  T:  08/05/2006  Job:  [id number]

## 2010-05-25 NOTE — Procedures (Signed)
[facility]  Patient:    Mark West, Mark West Visit Number: [id number] MRN: [id number]          Service Type: END Location: ENDO Attending Physician:  Louie Bun Dictated by:   Everardo All Madilyn Fireman, M.D. Proc. Date: 07/06/01 Admit Date:  07/06/2001 Discharge Date: 07/06/2001   CC:         Arvella Merles, M.D.   Procedure Report  PROCEDURE:  Flexible sigmoidoscopy with polypectomy.  INDICATIONS FOR PROCEDURE:  History of colon cancer due for surveillance.  DESCRIPTION OF PROCEDURE:  The patient was placed in the left lateral decubitus position then placed on the pulse monitor with continuous low flow oxygen delivered by nasal cannula. He was sedated with 60 mg IV Demerol and 6 mg IV Versed. The Olympus video colonoscope was inserted into the rectum and advanced to the cecum and then advanced to the ileocolonic anastomosis at approximately 60 cm. The terminal ileum was explored for several centimeters and appeared to be within normal limits. The prep was good. At the surgical anastomosis along an anastomotic ridge, there was a 1.2 cm friable polypoid structure that was felt by its location to be likely granulation tissue. It was removed by snare in one piece and sent for histology. The remainder of the descending, sigmoid, and rectum appeared normal all the way down to the anus with no further polyps, masses, diverticula or other mucosal abnormalities. The scope was then withdrawn and the patient returned to the recovery room in stable condition. The patient tolerated the procedure well and there were no immediate complications.  IMPRESSION:  Anastomotic polyp versus granulation tissue.  PLAN:  Await histology and will renew repeat study within at least without years. Dictated by:   Everardo All Madilyn Fireman, M.D. Attending Physician:  Louie Bun DD:  07/06/01 TD:  07/08/01 Job: [id number]

## 2010-05-25 NOTE — Op Note (Signed)
NAME:  CALI, CUARTAS NO.:  [id number]   MEDICAL RECORD NO.:  [id number]          PATIENT TYPE:  INP   LOCATION:  0002                         FACILITY:  [facility]   PHYSICIAN:  Angelia Mould. Derrell Lolling, M.D.DATE OF BIRTH:  02-Jul-1940   DATE OF PROCEDURE:  11/30/2003  DATE OF DISCHARGE:                                 OPERATIVE REPORT   PREOPERATIVE DIAGNOSIS:  Mesenteric and retroperitoneal adenopathy.   POSTOPERATIVE DIAGNOSIS:  Mesenteric and retroperitoneal adenopathy, final  pathology pending.   OPERATION PERFORMED:  1.  Exploratory laparotomy.  2.  Extensive lysis of adhesions requiring 45 minutes.  3.  Excisional biopsy of multiple mesenteric lymph nodes.   SURGEON:  Dr. Claud Kelp   FIRST ASSISTANT:  Dr. Chevis Pretty   OPERATIVE INDICATIONS:  This is a 70 year old white man in good health.  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  He received  chemotherapy.  He states that he had one positive node.  He has had no known  recurrence to date.  He recently developed right flank pain and got a CAT  scan which showed slightly enlarged celiac nodes, prominent mesenteric lymph  nodes, and some minimally enlarged retroperitoneal lymph nodes.  The right  flank pain resolved, and it became apparent that he had shingles.  He has  become asymptomatic.  On exam, he does not have any palpable mass in the  abdomen nor does he have any peripheral adenopathy.  The radiologists were  quite concerned that this represented metastatic disease or possibly  lymphoma.  The patient was offered laparotomy for clarification of his  diagnosis, and he desired to have that done.  He underwent a bowel prep at  home and is brought to the operating room electively.   OPERATIVE FINDINGS:  The patient had mesh repair of a ventral hernia, and we  had to go through that to get into the abdominal cavity.  He had extensive  chronic adhesions which were soft, and we could  take them down, but it took  almost 45 minutes to do so.  After that, we could examine the entire length  of the small bowel and find that the distal small bowel was connected to the  mid to distal sigmoid colon just above the sacral promontory.  I found 2 or  3 enlarged lymph nodes in the root of the small bowel mesentery distally.  These were at least 2-2.5 cm in size.  He had a fat necrosis of the small  bowel mesentery which I also excised.  There was no mass in the liver.  The  gallbladder felt soft and did not have any stones in it.  The hepatoduodenal  ligament felt basically normal, although there may have been some very tiny,  soft lymph nodes there as well.  There were too much adhesions for me to  feel the celiac area.  The right and left lobes of the liver felt okay,  however.  The peritoneal surface was otherwise normal.  There were no  peritoneal nodules.  The pelvis felt normal.  There was no pelvic  mass.  I  could not appreciate any iliac adenopathy either.   OPERATIVE TECHNIQUE:  Following the induction of general endotracheal  anesthesia, the patient's abdomen was prepped and draped in a sterile  fashion.  A midline incision was made, excising the old scar which was quite  wide.  Dissection was carried down through the subcutaneous tissue which was  quite scarred in.  We incised the fascia at the upper abdomen above the mesh  hernia repair, and we actually were able to enter the peritoneal space in  that area and slowly began to take adhesion down off the anterior abdominal  wall.  We slowly opened the incision, opening the mesh in the midline with a  knife, and taking adhesions down as we went.  Ultimately, we opened most of  the incision above and below the umbilicus.  We then had to spend 30-40  minutes to take down all of the adhesions and untangle the small bowel and  the ligament of Treitz all the way to the anastomosis and then down into the  rectum.  Once this was  done, exploration was carried out.  There was a small  pedunculated nodule on the mid small bowel mesentery which I excised.  Frozen section revealed fat necrosis.  I felt about three pathologically  enlarged lymph nodes in the root of the small bowel mesentery.  This was in  the more distal part of the small bowel.  I was able to incise the  peritoneum of the mesentery overlying these lymph nodes and dissected them  out.  Small vascular and lymphatic channels were ligated with 3-0 silk ties.  Both lymph nodes were about 2-2.5 cm in size; they were very soft.  They  were sent to the lab.  The pathologist performed touch preps and did not see  any evidence of cancer.  I felt that these could be reactive lymph nodes or  could be a very low-grade lymphoma.  They will be worked up for lymphoma.  I  felt that I had adequate tissue.  The pathologist stated clearly that we had  adequate tissue.  I did not see any point in dissecting any more lymph nodes  out.  We irrigated the abdomen and pelvis.  All the irrigation fluid  returned clear.  There was no bleeding.  Small bowel and rectum returned to  their anatomic positions.  The midline fascia was closed with interrupted  sutures of #1 Novofil.  Probably about 20 such interrupted sutures were  required to close the midline fascia.  The wound was irrigated was saline  and the skin closed with skin staples.  Clean bandages were placed and the  patient taken to the recovery room in stable condition.  Estimated blood  loss was about 100-150 mL.  Complications none.  Sponge, needle, and  instrument counts were correct.     Hayw   HMI/MEDQ  D:  11/30/2003  T:  11/30/2003  Job:  [id number]   cc:   Georgann Housekeeper, MD  [address]  [place]  [place] [address]  Fax: [phone]   Lyn Records III, M.D.  [address]  [place]  [place] [address]  Fax: [phone]  Claudette Laws, M.D.  [address]  [place]   [place] [address]  Fax: [phone]   Everardo All. Madilyn Fireman, M.D.  [address]  [place]  [place] [address]  Fax: [phone]

## 2010-05-25 NOTE — Discharge Summary (Signed)
NAME:  Mark West, Mark West NO.:  [id number]   MEDICAL RECORD NO.:  [id number]          PATIENT TYPE:  INP   LOCATION:  0444                         FACILITY:  [facility]   PHYSICIAN:  Angelia Mould. Derrell Lolling, M.D.DATE OF BIRTH:  09-23-1940   DATE OF ADMISSION:  11/30/2003  DATE OF DISCHARGE:  12/07/2003                                 DISCHARGE SUMMARY   FINAL DIAGNOSES:  1.  Low-grade follicular lymphoma.  2.  Coronary artery disease status post myocardial infarction, status post      coronary artery stent placement.  3.  Obstruction of the right carotid artery.  4.  History of prostate nodule.  5.  History of ventral hernia repair with mesh.  6.  Remote history of colon cancer.   OPERATIONS PERFORMED:  Exploratory laparotomy, extensive lysis of adhesions,  excisional biopsy of multiple mesenteric lymph nodes.  Date of surgery:  November 30, 2003.   HISTORY:  This is a 70 year old white man who has a history of colon cancer  and underwent a subtotal colectomy in 1992 for what he reports as two  separate cancers.  He has no know recurrence to date but it appears that he  received some type of chemotherapy.  He sustained a myocardial infarction on  September 07, 2003.  He was worked up by Dr. Katrinka Blazing.  He had coronary stent  placed and remains on Plavix.   More recently, he developed some right flank pain and went to the emergency  room and had a CT scan which did not show any acute problem but did show  some enlarged celiac lymph nodes and some prominent retroperitoneal lymph  nodes.  The pain resolved and he developed shingles to account for his pain.  He is now asymptomatic and the question arose as to why he had  intraabdominal adenopathy.  He is followed by Dr. Mickel Crow for a prostate  nodule but the PSA has remained low.  I was asked to see him to consider  biopsying his lymph nodes.  He is admitted to the hospital for exploratory  laparotomy.   PHYSICAL EXAMINATION:   GENERAL:  A pleasant middle-aged gentleman, no  distress.  VITAL SIGNS:  Weight 226, height 5 feet 9 inches.  NECK:  Reveals no mass or jugular venous distention.  LUNGS:  Clear to auscultation.  He does have resolving shingles on the right  chest wall in the T6 or T7 dermatome.  HEART:  Revealed regular rate and rhythm, no murmur.  ABDOMEN:  Obese, soft.  Midline scar present.  No inguinal adenopathy.  GENITOURINARY:  No testicular or penis mass.  LYMPHATICS:  I do not feel any enlarged lymph nodes in the neck, axilla, or  groins.   HOSPITAL COURSE:  On the date of admission the patient was taken to the  operating room and underwent exploratory laparotomy.  He had an extensive  lysis of adhesions and findings were consistent with a subtotal colectomy  with anastomosis between the ileum and the mid sigmoid colon.  He did have  some palpable adenopathy at the root of the small bowel mesentery  and I  excised a couple of these lymph nodes.   Postoperatively, it took several days for Dr. Laureen Ochs to complete all of the  evaluation, but after he had completed it, he stated this was a low-grade  follicular lymphoma.  This was discussed with the patient.  Follow-up was  arranged with Dr. Jama Flavors who has been his oncologist in the past.   In terms of his clinical recovery, it was uneventful.  His Foley was removed  on November 26 and he did well thereafter.  The ileus lasted for a few days.  We did get the NG tube out on November 27 and his ileus slowly resolved  thereafter.  Once we started him back on diet, we started him back on his  Plavix.  We have been maintaining him on Lovenox because of his coronary  stent, but then returned him to his Plavix and usual medications.   He was discharged on December 07, 2003.  At that time, he was tolerating a  regular diet, having bowel movements, and felt ready to go home.  His wound  was healing uneventfully.   DISCHARGE MEDICATIONS:  1.   Vicodin for pain.  2.  Plavix 75 mg a day.  3.  Aspirin 325 mg a day.  4.  Lipitor 40 mg a day.  5.  Lisinopril 20 mg a day.  6.  Metoprolol 12.5 mg b.i.d.  7.  Multivitamins.   He was to follow up with me in the office in 1 week for staple removal and  postoperative check.  He has an appointment with Dr. Jama Flavors on  December 09, 2003.     Hayw   HMI/MEDQ  D:  12/16/2003  T:  12/16/2003  Job:  [id number]   cc:   Georgann Housekeeper, MD  [address]  [place]  [place] [address]  Fax: [phone]   Lyn Records III, M.D.  [address]  [place]  [place] [address]  Fax: [phone]   Lennis P. Darrold Span, M.D.  [address] [facility]  [place]  [place] [address]  Fax: [phone]

## 2010-05-25 NOTE — Procedures (Signed)
[facility]  Patient:    Mark West, Mark West                   MRN: [id number] Proc. Date: 06/13/99 Adm. Date:  [id number] Disc. Date: [id number] Attending:  Louie Bun CC:         Elana Alm Eliezer Lofts., M.D.                           Procedure Report  PROCEDURE:  Flexible sigmoidoscopy with biopsy.  INDICATION FOR PROCEDURE:  History of Dukes stage C colon cancer with two synchronous lesions, who presents for annual flexible sigmoidoscopy.  DESCRIPTION OF PROCEDURE:  The patient was placed in the left lateral decubitus position and placed on the pulse monitor with continuous low-flow oxygen delivered by nasal cannula.  Due to difficulties with prep and patient discomfort in the outpatient setting, this was done with full colonoscopy pre with conscious sedation.  He was sedated with 40 mg of IV Demerol and 5 mg IV Versed.  The Olympus video colonoscope was inserted into the rectum and advanced to the anastomosis at approximately 50 cm.  There was a small round area of what appeared to be granulation tissue that was quite friable and bled briskly when biopsied, and was covered with mucus or exudate.  Biopsies were taken to rule out an adenomatous polyp.  Otherwise, the anastomosis appeared intact without any signs of neoplasm, as did the remaining descending and sigmoid colon and rectum down to the anus, where retroflexed view revealed no obvious internal hemorrhoids.  The colonoscope was then withdrawn and the patient returned to the recovery room in stable condition.  He tolerated the procedure well, and there were no immediate complications.  IMPRESSION:  Granulation tissue at the surgical anastomosis versus adenomatous polyp.  PLAN:  Await histology.  Will continue to repeat sigmoidoscopies every one to two years. DD:  06/13/99 TD:  06/17/99 Job: [id number]

## 2010-05-25 NOTE — Procedures (Signed)
[facility]  Patient:    Mark West, Mark West Visit Number: [id number] MRN: [id number]          Service Type: SUR Location: 3W 0347 02 Attending Physician:  Louie Bun Dictated by:   Everardo All Madilyn Fireman, M.D. Proc. Date: 07/08/01 Admit Date:  07/08/2001 Discharge Date: 07/09/2001                             Procedure Report  PROCEDURE PERFORMED:  Flexible sigmoidoscopy with control of hemorrhage.  INDICATION FOR PROCEDURE:  Post polypectomy bleed from polypectomy at colonic surgical anastomosis site two days ago.  DESCRIPTION OF PROCEDURE:  The patient was placed in the left lateral decubitus position and placed on the pulse monitor with continuous low flow oxygen delivered by nasal cannula.  He was sedated with 80 mg IV Demerol and 8 mg IV Versed.  The Olympus video colonoscope was inserted into the rectum and advanced to the surgical anastomosis where there was streaming of bright red blood that appeared to pulsate.  Approximately 4 cc of epinephrine were injected around it and it slowed to a point where I could see a visible vessel still streaming blood.  Two endo-clips were placed which has greatly diminished the bleeding.  I injected 2 cc more around the endo-clips and the bleeding finally appeared to cease.  The scope was then withdrawn and the patient returned to the recovery room in stable condition.  He tolerated the procedure well and there were no immediate complications.  IMPRESSION:  Bleeding from polypectomy site with cessation of bleeding after endoscopic therapy outlined above.  PLAN:  Will observe for evidence of rebleeding and hold aspirin and any nonsteroidal anti-inflammatory drugs for at least two weeks. Dictated by:   Everardo All Madilyn Fireman, M.D. Attending Physician:  Louie Bun DD:  07/08/01 TD:  07/11/01 Job: [id number]

## 2010-08-13 ENCOUNTER — Other Ambulatory Visit: Payer: Self-pay | Admitting: Oncology

## 2010-08-13 ENCOUNTER — Encounter (HOSPITAL_BASED_OUTPATIENT_CLINIC_OR_DEPARTMENT_OTHER): Payer: Medicare Other | Admitting: Oncology

## 2010-08-13 DIAGNOSIS — Z85038 Personal history of other malignant neoplasm of large intestine: Secondary | ICD-10-CM

## 2010-08-13 DIAGNOSIS — D509 Iron deficiency anemia, unspecified: Secondary | ICD-10-CM

## 2010-08-13 DIAGNOSIS — Z87898 Personal history of other specified conditions: Secondary | ICD-10-CM

## 2010-08-13 DIAGNOSIS — C8589 Other specified types of non-Hodgkin lymphoma, extranodal and solid organ sites: Secondary | ICD-10-CM

## 2010-08-13 LAB — CBC WITH DIFFERENTIAL/PLATELET
Basophils Absolute: 0 10*3/uL (ref 0.0–0.1)
Eosinophils Absolute: 0.1 10*3/uL (ref 0.0–0.5)
HGB: 10.1 g/dL — ABNORMAL LOW (ref 13.0–17.1)
LYMPH%: 24.7 % (ref 14.0–49.0)
MONO#: 0.6 10*3/uL (ref 0.1–0.9)
NEUT#: 4.1 10*3/uL (ref 1.5–6.5)
Platelets: 234 10*3/uL (ref 140–400)
RBC: 4.25 10*6/uL (ref 4.20–5.82)
WBC: 6.4 10*3/uL (ref 4.0–10.3)

## 2010-08-13 LAB — URINALYSIS, MICROSCOPIC - CHCC
Ketones: NEGATIVE mg/dL
Nitrite: NEGATIVE
Protein: NEGATIVE mg/dL

## 2010-08-13 LAB — COMPREHENSIVE METABOLIC PANEL
Alkaline Phosphatase: 50 U/L (ref 39–117)
BUN: 18 mg/dL (ref 6–23)
Glucose, Bld: 146 mg/dL — ABNORMAL HIGH (ref 70–99)
Total Bilirubin: 0.4 mg/dL (ref 0.3–1.2)

## 2010-08-13 LAB — IRON AND TIBC
%SAT: 5 % — ABNORMAL LOW (ref 20–55)
Iron: 19 ug/dL — ABNORMAL LOW (ref 42–165)
UIBC: 357 ug/dL

## 2010-08-13 LAB — MORPHOLOGY: PLT EST: ADEQUATE

## 2010-09-12 ENCOUNTER — Encounter (HOSPITAL_BASED_OUTPATIENT_CLINIC_OR_DEPARTMENT_OTHER): Payer: Medicare Other | Admitting: Oncology

## 2010-09-12 ENCOUNTER — Other Ambulatory Visit: Payer: Self-pay | Admitting: Oncology

## 2010-09-12 DIAGNOSIS — C8589 Other specified types of non-Hodgkin lymphoma, extranodal and solid organ sites: Secondary | ICD-10-CM

## 2010-09-12 DIAGNOSIS — D509 Iron deficiency anemia, unspecified: Secondary | ICD-10-CM

## 2010-09-12 LAB — CBC WITH DIFFERENTIAL/PLATELET
EOS%: 0.6 % (ref 0.0–7.0)
Eosinophils Absolute: 0 10*3/uL (ref 0.0–0.5)
LYMPH%: 24.2 % (ref 14.0–49.0)
MCH: 25.9 pg — ABNORMAL LOW (ref 27.2–33.4)
MCV: 79.8 fL (ref 79.3–98.0)
MONO%: 9.7 % (ref 0.0–14.0)
Platelets: 219 10*3/uL (ref 140–400)
RBC: 4.91 10*6/uL (ref 4.20–5.82)
RDW: 27.1 % — ABNORMAL HIGH (ref 11.0–14.6)

## 2010-10-09 ENCOUNTER — Encounter (HOSPITAL_BASED_OUTPATIENT_CLINIC_OR_DEPARTMENT_OTHER): Payer: Medicare Other | Admitting: Oncology

## 2010-10-09 ENCOUNTER — Other Ambulatory Visit: Payer: Self-pay | Admitting: Oncology

## 2010-10-09 DIAGNOSIS — C8589 Other specified types of non-Hodgkin lymphoma, extranodal and solid organ sites: Secondary | ICD-10-CM

## 2010-10-09 DIAGNOSIS — D509 Iron deficiency anemia, unspecified: Secondary | ICD-10-CM

## 2010-10-09 LAB — CBC WITH DIFFERENTIAL/PLATELET
BASO%: 0.8 % (ref 0.0–2.0)
Eosinophils Absolute: 0 10*3/uL (ref 0.0–0.5)
LYMPH%: 28.2 % (ref 14.0–49.0)
MCHC: 32.7 g/dL (ref 32.0–36.0)
MCV: 83.7 fL (ref 79.3–98.0)
MONO#: 0.4 10*3/uL (ref 0.1–0.9)
MONO%: 7.5 % (ref 0.0–14.0)
NEUT#: 3.5 10*3/uL (ref 1.5–6.5)
Platelets: 203 10*3/uL (ref 140–400)
RBC: 4.72 10*6/uL (ref 4.20–5.82)
RDW: 24.2 % — ABNORMAL HIGH (ref 11.0–14.6)
WBC: 5.6 10*3/uL (ref 4.0–10.3)

## 2010-10-22 LAB — COMPREHENSIVE METABOLIC PANEL
AST: 30
Albumin: 3.5
Chloride: 107
Creatinine, Ser: 1.71 — ABNORMAL HIGH
GFR calc Af Amer: 49 — ABNORMAL LOW
Potassium: 5.5 — ABNORMAL HIGH
Sodium: 138
Total Bilirubin: 1.1

## 2010-10-22 LAB — DIFFERENTIAL
Basophils Absolute: 0
Basophils Relative: 0
Eosinophils Absolute: 0
Eosinophils Relative: 0
Lymphocytes Relative: 15
Lymphs Abs: 1.9
Monocytes Absolute: 0.5
Monocytes Absolute: 0.9 — ABNORMAL HIGH
Monocytes Relative: 11
Neutro Abs: 4.6

## 2010-10-22 LAB — BASIC METABOLIC PANEL
CO2: 23
Chloride: 107
GFR calc Af Amer: >60
Potassium: 3.9
Sodium: 136

## 2010-10-22 LAB — CBC
Hemoglobin: 12.5 — ABNORMAL LOW
MCHC: 33.7
MCHC: 34.1
MCV: 86.1
RBC: 4.25
RBC: 4.92
WBC: 9.9

## 2010-10-22 LAB — POCT CARDIAC MARKERS
CKMB, poc: <1 — ABNORMAL LOW
Operator id: 1211
Troponin i, poc: <0.05

## 2010-10-22 LAB — URINALYSIS, ROUTINE W REFLEX MICROSCOPIC
Bilirubin Urine: NEGATIVE
Ketones, ur: NEGATIVE
Nitrite: NEGATIVE
Protein, ur: NEGATIVE
Specific Gravity, Urine: 1.018
Urobilinogen, UA: 0.2

## 2010-10-22 LAB — APTT: aPTT: 26

## 2010-10-22 LAB — B-NATRIURETIC PEPTIDE (CONVERTED LAB): Pro B Natriuretic peptide (BNP): <30

## 2010-10-22 LAB — CK: Total CK: 85

## 2010-11-08 ENCOUNTER — Other Ambulatory Visit: Payer: Self-pay | Admitting: Oncology

## 2010-11-08 ENCOUNTER — Encounter (HOSPITAL_BASED_OUTPATIENT_CLINIC_OR_DEPARTMENT_OTHER): Payer: Medicare Other | Admitting: Oncology

## 2010-11-08 DIAGNOSIS — C184 Malignant neoplasm of transverse colon: Secondary | ICD-10-CM

## 2010-11-08 LAB — CBC WITH DIFFERENTIAL/PLATELET
Eosinophils Absolute: 0.1 10*3/uL (ref 0.0–0.5)
HCT: 38.7 % (ref 38.4–49.9)
LYMPH%: 28.2 % (ref 14.0–49.0)
MCV: 86.5 fL (ref 79.3–98.0)
MONO%: 12.1 % (ref 0.0–14.0)
NEUT#: 3.4 10*3/uL (ref 1.5–6.5)
NEUT%: 58.3 % (ref 39.0–75.0)
Platelets: 212 10*3/uL (ref 140–400)
RBC: 4.47 10*6/uL (ref 4.20–5.82)

## 2010-12-28 ENCOUNTER — Other Ambulatory Visit: Payer: Self-pay | Admitting: Oncology

## 2010-12-28 ENCOUNTER — Other Ambulatory Visit (HOSPITAL_BASED_OUTPATIENT_CLINIC_OR_DEPARTMENT_OTHER): Payer: Medicare Other | Admitting: Lab

## 2010-12-28 ENCOUNTER — Ambulatory Visit (HOSPITAL_BASED_OUTPATIENT_CLINIC_OR_DEPARTMENT_OTHER): Payer: Medicare Other | Admitting: Oncology

## 2010-12-28 DIAGNOSIS — C8589 Other specified types of non-Hodgkin lymphoma, extranodal and solid organ sites: Secondary | ICD-10-CM

## 2010-12-28 DIAGNOSIS — D62 Acute posthemorrhagic anemia: Secondary | ICD-10-CM

## 2010-12-28 DIAGNOSIS — I10 Essential (primary) hypertension: Secondary | ICD-10-CM

## 2010-12-28 DIAGNOSIS — I2510 Atherosclerotic heart disease of native coronary artery without angina pectoris: Secondary | ICD-10-CM

## 2010-12-28 DIAGNOSIS — K639 Disease of intestine, unspecified: Secondary | ICD-10-CM

## 2010-12-28 DIAGNOSIS — C189 Malignant neoplasm of colon, unspecified: Secondary | ICD-10-CM

## 2010-12-28 DIAGNOSIS — C8590 Non-Hodgkin lymphoma, unspecified, unspecified site: Secondary | ICD-10-CM

## 2010-12-28 DIAGNOSIS — Z85038 Personal history of other malignant neoplasm of large intestine: Secondary | ICD-10-CM

## 2010-12-28 DIAGNOSIS — I739 Peripheral vascular disease, unspecified: Secondary | ICD-10-CM

## 2010-12-28 DIAGNOSIS — D509 Iron deficiency anemia, unspecified: Secondary | ICD-10-CM

## 2010-12-28 LAB — IRON AND TIBC
%SAT: 12 % — ABNORMAL LOW (ref 20–55)
Iron: 38 ug/dL — ABNORMAL LOW (ref 42–165)
UIBC: 287 ug/dL (ref 125–400)

## 2010-12-28 LAB — COMPREHENSIVE METABOLIC PANEL
ALT: 17 U/L (ref 0–53)
AST: 18 U/L (ref 0–37)
Alkaline Phosphatase: 53 U/L (ref 39–117)
Creatinine, Ser: 1.03 mg/dL (ref 0.50–1.35)
Sodium: 140 mEq/L (ref 135–145)
Total Bilirubin: 0.3 mg/dL (ref 0.3–1.2)
Total Protein: 5.9 g/dL — ABNORMAL LOW (ref 6.0–8.3)

## 2010-12-28 LAB — CBC WITH DIFFERENTIAL/PLATELET
BASO%: 0.6 % (ref 0.0–2.0)
EOS%: 0.5 % (ref 0.0–7.0)
HCT: 38.5 % (ref 38.4–49.9)
LYMPH%: 20.7 % (ref 14.0–49.0)
MCH: 28.8 pg (ref 27.2–33.4)
MCHC: 33.1 g/dL (ref 32.0–36.0)
MCV: 86.8 fL (ref 79.3–98.0)
MONO#: 0.5 10*3/uL (ref 0.1–0.9)
MONO%: 6.7 % (ref 0.0–14.0)
NEUT%: 71.5 % (ref 39.0–75.0)
Platelets: 204 10*3/uL (ref 140–400)
RBC: 4.44 10*6/uL (ref 4.20–5.82)
WBC: 7.3 10*3/uL (ref 4.0–10.3)

## 2010-12-28 LAB — LACTATE DEHYDROGENASE: LDH: 113 U/L (ref 94–250)

## 2010-12-28 NOTE — Patient Instructions (Addendum)
May substitute for Integra:      Over the counter ferrous fumarate or ferrous gluconate  ~ 324 mg  Twice daily on empty stomach with OJ.   Do not use any slow release iron preparations

## 2010-12-29 NOTE — Progress Notes (Signed)
OFFICE PROGRESS NOTE Date of Visit 12-28-2010 Physicians:  J.Hayes, K.Hussain, H.Smith, M.Ottelin, H.Derrell Lolling  INTERVAL HISTORY:   Patient is seen, alone for visit today, in follow up of his iron deficiency anemia, low grade NHL and remote history of colon cancer. History is of synchronous colon carcinomas in 1992, treated with subtotal colectomy and adjuvant 5FU with levamisole, not known recurrent. He was found to have low grade, follicular NHL in Nov 2005 at laparotomy with biopsy of abdominal adenopathy which had been found incidentally on CT. He has not required any treatment of the NHL. The iron deficiency was found in Jan 2012 because of drop in hgb from usual 13.4 in June 2011 to 12.4. He had colonoscopy by Dr.Hayes in April 2012 with villous mass in the ileum, biopsies negative for malignancy then, path with "inflamed polypoid granulation-type tissue".  He has been on oral iron ongoing. He has not seen any bleeding, stools are dark with the iron, no change in bowel habits or abdominal pain. Last CT AP was March 2011. He had physical exam by Dr.Husain recently, sees him back in June. Next appointment with Dr.Hayes anticipated April 2013. Review of Systems otherwise: no respiratory or cardiac symptoms. Good energy. Recent partial vision loss in right eye reportedly due to complete obstruction of right carotid artery. No other neurologic symptoms. Remainder of full 10 point ROS negative. He has had flu shot this fall. Objective:  Vital signs in last 24 hours:  BP 140/76  Pulse 71  Temp(Src) 96.9 F (36.1 C) (Oral)  Wt 246 lb 9.6 oz (111.857 kg)  Alert, good historian, easily mobile, looks comfortable.  HEENT:mucous membranes moist, pharynx normal without lesions PERRL. Not icteric. No JVD. LymphaticsCervical, supraclavicular, and axillary nodes normal.No inguinal adenopathy. Resp: clear to auscultation bilaterally and normal percussion bilaterally Cardio: regular rate and rhythm GI:  soft, non-tender; bowel sounds normal; no masses,  no organomegaly. Obese Extremities: no edema, cords, tenderness Skin minimally erythematous area apparent scar 0.5 cm right scapular area. Neuro otherwise nonfocal    Lab Results:   [facility] 12/28/10 0932  WBC 7.3  HGB 12.8*  HCT 38.5  PLT 204  ANC 5.2. Differential not remarkable. RDW 15.3 MCV 86.8  BMET/CMET   Basename 12/28/10 0932  NA 140  K 4.5  CL 107  CO2 20  GLUCOSE 176*  BUN 17  CREATININE 1.03  CALCIUM 9.0  remainder of CMET normal except T Protein 5.9 LDH 113  Iron low at 38 and % sat low at 12, these having been 19 and 5 in Aug.2012. Studies/Results:  No results found.  Medications: I have reviewed the patient's current medications.  Assessment/Plan:  1. Iron deficiency anemia: apparently from some GI blood loss related to the villous mass in ileum. Surgical resection may be needed if progressive problems. Continue oral iron/ consider IV iron. Return visit with CBC/iron studies in March 2013 or sooner if needed 2. Low grade NHL on biopsy of abdominal lymph nodes in 2005, not clinically apparent.  3.History of synchronous colon cancers as above With these problems, will repeat CT AP + CXR prior to my visit in March 4.Carotid artery obstruction on right with decreased vision 5.Diabetes 6.Coronary artery disease, previous MI 7. Obesity        Mark West P, MD   12/29/2010, 8:51 PM

## 2011-01-03 ENCOUNTER — Telehealth: Payer: Self-pay

## 2011-01-03 NOTE — Telephone Encounter (Signed)
Called pt to inform him per Dr. Darrold Span, his iron is still very low, per labs done 12/21, a little better than last time, but still low.  She recommends giving him 1 dose of IV iron (feraheme), in addition to his oral iron to be set up for January if he agrees.  Explained to pt administration/effects of feraheme.  Pt agrees, states he is not at home right now, and wanted to see if his insurance covers this.  Pt states he will be changing insurance after the first of the year.  Pt states he will call back to see if there are any schedule conflicts and call back with his new insurance information.  Note left for desk RN to f/u.  Per Lanora Manis, managed care, pt needs to give insurance information first before she can verify coverage.

## 2011-01-09 ENCOUNTER — Telehealth: Payer: Self-pay

## 2011-01-09 NOTE — Telephone Encounter (Signed)
TOLD MR. Mark West THAT HE NEEDED TO CALL HIS PCP IN THE AM AGAIN.  ANOTHER DOCT0R IN THE PRACTICE CAN PRESCRIBE THE TAMIFLU IF HIS SYMPTOMS AND HX. WARRENT IT

## 2011-01-09 NOTE — Telephone Encounter (Signed)
SPOKE WITH MR. Episcopo TO SEE IF HE HAS RECEIVED HIS NEW INSURANCE INFORMATION.  HE STATED THAT HE HAD NOT AS OF TODAY. TOLD HIM THAT HE NEEDED TO CALL AND SPEAK WITH ELIZABETH SUTTON IN MANAGED CARE WHEN HE HAD HIS INS. INFO SO SHE CAN VERIFY COVERAGE FOR THE FERAHEME PRIOR TO SCHEDULING THE APPT.  PT. VERBALIZED UNDERSTANDING.

## 2011-01-23 ENCOUNTER — Other Ambulatory Visit: Payer: Self-pay | Admitting: Oncology

## 2011-01-23 ENCOUNTER — Telehealth: Payer: Self-pay

## 2011-01-23 NOTE — Telephone Encounter (Signed)
CALLED MR. Hertzberg AND LM THAT ELIZABETH IN MANAGED CARE SAID THAT FERAHEME IS COVERED UNDER HIS NEW INSURANCE. HE NEEDS TO CALL BACK TO THE OFFICE AND SPEAK WITH THE SCHEDULERS TO SET UP INFUSION BY THE END OF January WOULD BE OPTIMAL, BUT NEXT 2-4 WEEKS IS FINE WITH DR. Darrold Span. DR. Darrold Span SENT ORDER TO SCHEDULERS TO SET UP.  WILL NEED TO NOTIFY DR. Darrold Span WHEN APPT. SCHEDULED SO MEDICATION CAN BE ORDERED.

## 2011-01-25 ENCOUNTER — Telehealth: Payer: Self-pay | Admitting: Oncology

## 2011-01-25 NOTE — Telephone Encounter (Signed)
Talked to pt, gave him appt for Iron, he is also aware of appt for March 2013, lab, scans and MD

## 2011-01-26 ENCOUNTER — Other Ambulatory Visit: Payer: Self-pay | Admitting: Oncology

## 2011-01-26 ENCOUNTER — Encounter: Payer: Self-pay | Admitting: Oncology

## 2011-01-26 DIAGNOSIS — D509 Iron deficiency anemia, unspecified: Secondary | ICD-10-CM

## 2011-01-26 HISTORY — DX: Iron deficiency anemia, unspecified: D50.9

## 2011-02-15 ENCOUNTER — Telehealth: Payer: Self-pay

## 2011-02-15 NOTE — Telephone Encounter (Signed)
TOLD MR. Mark West THAT THE PHARMACIST GINNA TUCKER SAID THAT HIM BEING ON ATB WOULD NOT INTERFERE WITH THE FERAHEME INFUSION 02-18-11. HE HAS HAD THE "CRUD" SINCE LAST Thursday.  TOLD HIM HE COULD RESCHEDULE IF HE DID NOT FEEL UP TO INFUSION.  HE WILL CALL Monday AM 02-18-11 TO CANCELL IF NOT FEELING UP TO INFUSION.

## 2011-02-18 ENCOUNTER — Ambulatory Visit (HOSPITAL_BASED_OUTPATIENT_CLINIC_OR_DEPARTMENT_OTHER): Payer: Medicare Other

## 2011-02-18 VITALS — BP 127/77 | HR 67 | Temp 97.9°F

## 2011-02-18 DIAGNOSIS — D509 Iron deficiency anemia, unspecified: Secondary | ICD-10-CM

## 2011-02-18 MED ORDER — SODIUM CHLORIDE 0.9 % IV SOLN
1020.0000 mg | Freq: Once | INTRAVENOUS | Status: AC
  Administered 2011-02-18: 1020 mg via INTRAVENOUS
  Filled 2011-02-18: qty 34

## 2011-02-18 MED ORDER — SODIUM CHLORIDE 0.9 % IV SOLN
Freq: Once | INTRAVENOUS | Status: AC
  Administered 2011-02-18: 10:00:00 via INTRAVENOUS

## 2011-02-19 ENCOUNTER — Ambulatory Visit: Payer: Medicare Other

## 2011-03-22 ENCOUNTER — Telehealth: Payer: Self-pay

## 2011-03-22 ENCOUNTER — Ambulatory Visit (HOSPITAL_COMMUNITY)
Admission: RE | Admit: 2011-03-22 | Discharge: 2011-03-22 | Disposition: A | Discharge status: Home / Self Care | Payer: Medicare Other | Source: Ambulatory Visit | Attending: Oncology | Admitting: Oncology

## 2011-03-22 ENCOUNTER — Other Ambulatory Visit (HOSPITAL_BASED_OUTPATIENT_CLINIC_OR_DEPARTMENT_OTHER): Payer: Medicare Other | Admitting: Lab

## 2011-03-22 DIAGNOSIS — C8590 Non-Hodgkin lymphoma, unspecified, unspecified site: Secondary | ICD-10-CM

## 2011-03-22 DIAGNOSIS — R059 Cough, unspecified: Secondary | ICD-10-CM | POA: Insufficient documentation

## 2011-03-22 DIAGNOSIS — R05 Cough: Secondary | ICD-10-CM | POA: Insufficient documentation

## 2011-03-22 DIAGNOSIS — C189 Malignant neoplasm of colon, unspecified: Secondary | ICD-10-CM

## 2011-03-22 DIAGNOSIS — Z87898 Personal history of other specified conditions: Secondary | ICD-10-CM | POA: Insufficient documentation

## 2011-03-22 LAB — LACTATE DEHYDROGENASE: LDH: 135 U/L (ref 94–250)

## 2011-03-22 LAB — CEA: CEA: 1.5 ng/mL (ref 0.0–5.0)

## 2011-03-22 LAB — CBC WITH DIFFERENTIAL/PLATELET
BASO%: 0.4 % (ref 0.0–2.0)
EOS%: 0.5 % (ref 0.0–7.0)
HCT: 39.9 % (ref 38.4–49.9)
LYMPH%: 20.7 % (ref 14.0–49.0)
MCH: 28.8 pg (ref 27.2–33.4)
MCHC: 32.6 g/dL (ref 32.0–36.0)
NEUT%: 69.9 % (ref 39.0–75.0)
Platelets: 198 10*3/uL (ref 140–400)
RBC: 4.51 10*6/uL (ref 4.20–5.82)
lymph#: 1.3 10*3/uL (ref 0.9–3.3)

## 2011-03-22 LAB — CMP (CANCER CENTER ONLY)
ALT(SGPT): 28 U/L (ref 10–47)
AST: 22 U/L (ref 11–38)
CO2: 27 mEq/L (ref 18–33)
Calcium: 9 mg/dL (ref 8.0–10.3)
Chloride: 98 mEq/L (ref 98–108)
Creat: 0.9 mg/dl (ref 0.6–1.2)
Sodium: 142 mEq/L (ref 128–145)
Total Protein: 6.8 g/dL (ref 6.4–8.1)

## 2011-03-22 NOTE — Telephone Encounter (Signed)
LISA CALLED STATING THAT Mark West IS THERE FOR HIS CT.  HE STATES THAT HE IS ALLERGIC TO IODINE.  THIS ALLERGY WAS NOT PREVIOUSLY IN EPIC AND NOW PLACED.  PT. HAS DONE 13 HR. STEROID PREP IN THE PAST.   PT. R/S TO 03-25-11 WITH PA IN RADIOLOGY TO WRITE FOR STEROID PREP. PT. WITH [employer] INS.  MRI OF ABDOMEN NEEDS PRIOR AUTHORIZATION AND WOLD NEED REVIEW WITH POSSIBLE DENIAL PER LINDA IN MANAGED CARE.  THERE WAS AN MRI OPENING TODAY FOR CONVINANCE OF PT. BUT CT IS WHAT IS RECOMMENDED SCAN PER INS., THUS,  REQUESTED PT. TO BE R/S AND APOLOGIZE TO PT.  FOR THE INCONVENIECE.

## 2011-03-25 ENCOUNTER — Encounter (HOSPITAL_COMMUNITY): Payer: Self-pay

## 2011-03-25 ENCOUNTER — Ambulatory Visit (HOSPITAL_COMMUNITY)
Admission: RE | Admit: 2011-03-25 | Discharge: 2011-03-25 | Disposition: A | Discharge status: Home / Self Care | Payer: Medicare Other | Source: Ambulatory Visit | Attending: Oncology | Admitting: Oncology

## 2011-03-25 DIAGNOSIS — Z980 Intestinal bypass and anastomosis status: Secondary | ICD-10-CM | POA: Insufficient documentation

## 2011-03-25 DIAGNOSIS — Z9221 Personal history of antineoplastic chemotherapy: Secondary | ICD-10-CM | POA: Insufficient documentation

## 2011-03-25 DIAGNOSIS — I7090 Unspecified atherosclerosis: Secondary | ICD-10-CM | POA: Insufficient documentation

## 2011-03-25 DIAGNOSIS — Z87898 Personal history of other specified conditions: Secondary | ICD-10-CM | POA: Insufficient documentation

## 2011-03-25 DIAGNOSIS — Z9049 Acquired absence of other specified parts of digestive tract: Secondary | ICD-10-CM | POA: Insufficient documentation

## 2011-03-25 DIAGNOSIS — Z85038 Personal history of other malignant neoplasm of large intestine: Secondary | ICD-10-CM | POA: Insufficient documentation

## 2011-03-25 DIAGNOSIS — N289 Disorder of kidney and ureter, unspecified: Secondary | ICD-10-CM | POA: Insufficient documentation

## 2011-03-25 HISTORY — DX: Malignant (primary) neoplasm, unspecified: C80.1

## 2011-03-25 MED ORDER — IOHEXOL 300 MG/ML  SOLN
125.0000 mL | Freq: Once | INTRAMUSCULAR | Status: AC | PRN
  Administered 2011-03-25: 125 mL via INTRAVENOUS

## 2011-03-28 ENCOUNTER — Telehealth: Payer: Self-pay | Admitting: *Deleted

## 2011-03-28 NOTE — Telephone Encounter (Signed)
Patient called asking for results of CT scans, Xray and blood work.  Tests done from 03-22-11 to 03-25-11 and are "open" not final.  Will notify providers of this request.  Patient has f/u Monday, 04-01-11.  Called patient to let him know MD will go over these results during f/u.

## 2011-03-29 ENCOUNTER — Encounter: Payer: Self-pay | Admitting: Oncology

## 2011-03-29 ENCOUNTER — Telehealth: Payer: Self-pay

## 2011-03-29 NOTE — Progress Notes (Signed)
CT report 03-25-11 sent to [facility]

## 2011-03-29 NOTE — Telephone Encounter (Signed)
SPOKE WITH MR. Mark West AND TOLD HIM THAT DR. Darrold Span SAID THAT HIS COUNTS WERE GOOD, INCLUDING HGB. IT WAS UP TO 13 AND NORMAL SIZE OF RBCS SINCE FERAHEME, CHEMISTRIES NORMAL EXCEPT BLOOD SUGAR 120, CEA NORMAL.  THE CXR WAS STABLE. THE CT SHOWS  LYMPH NODES STABLE FROM LAST SCAN. PT. PLEASED. DR. Darrold Span WILL DISCUSS ALL OF IT AT HIS APPT. 04-01-11.  PT. VERBALIZED UNDERSTANDING.

## 2011-04-01 ENCOUNTER — Ambulatory Visit (HOSPITAL_BASED_OUTPATIENT_CLINIC_OR_DEPARTMENT_OTHER): Payer: Medicare Other | Admitting: Oncology

## 2011-04-01 ENCOUNTER — Encounter: Payer: Self-pay | Admitting: Oncology

## 2011-04-01 VITALS — BP 125/62 | HR 81 | Temp 97.6°F | Wt 243.3 lb

## 2011-04-01 DIAGNOSIS — I2510 Atherosclerotic heart disease of native coronary artery without angina pectoris: Secondary | ICD-10-CM

## 2011-04-01 DIAGNOSIS — C8590 Non-Hodgkin lymphoma, unspecified, unspecified site: Secondary | ICD-10-CM

## 2011-04-01 DIAGNOSIS — Z85038 Personal history of other malignant neoplasm of large intestine: Secondary | ICD-10-CM

## 2011-04-01 DIAGNOSIS — C189 Malignant neoplasm of colon, unspecified: Secondary | ICD-10-CM

## 2011-04-01 DIAGNOSIS — E669 Obesity, unspecified: Secondary | ICD-10-CM

## 2011-04-01 DIAGNOSIS — E119 Type 2 diabetes mellitus without complications: Secondary | ICD-10-CM | POA: Insufficient documentation

## 2011-04-01 DIAGNOSIS — C8589 Other specified types of non-Hodgkin lymphoma, extranodal and solid organ sites: Secondary | ICD-10-CM

## 2011-04-01 DIAGNOSIS — N200 Calculus of kidney: Secondary | ICD-10-CM | POA: Insufficient documentation

## 2011-04-01 DIAGNOSIS — D509 Iron deficiency anemia, unspecified: Secondary | ICD-10-CM

## 2011-04-01 DIAGNOSIS — C8290 Follicular lymphoma, unspecified, unspecified site: Secondary | ICD-10-CM

## 2011-04-01 NOTE — Patient Instructions (Signed)
Your last colonoscopy with Dr.Hayes was 04-09-2010 and this needs to be repeated this year. Please call his office to schedule [phone].   Continue oral iron.   Increase exercise -- You can call Kathrin Penner about the Y programs if interested [phone]

## 2011-04-01 NOTE — Progress Notes (Signed)
OFFICE PROGRESS NOTE Date of Visit 04-01-2011 Physicians: K.Husain, J.Hayes, H.Smith, M.Ottelin, H. Derrell Lolling  INTERVAL HISTORY:  Patient is seen, alone for visit, in continuing attention to his iron deficiency anemia, NHL and remote history of colon cancer.  History is of synchronous B2 and C2 colon carcinomas in 1992, treated with subtotal colectomy and adjuvant 5FU with levamisole, not known recurrent. He was found to have low grade, follicular NHL in Nov 2005 at exploratory laparotomy with biopsy of abdominal adenopathy which had been found incidentally on CT. He has not required any treatment of the NHL. The iron deficiency was found in Jan 2012 because of drop in hgb from usual 13.4 in June 2011 to 12.4. He had colonoscopy by Dr.Hayes in April 2012 with villous mass in the ileum, biopsies negative for malignancy then, path with "inflamed polypoid granulation-type tissue". He has been on oral iron, but still had very low iron studies in Dec 2012, with Hgb then 12.8. He received IV iron (feraheme) without difficulty on 02-18-2011 which has helped symptomatically with his fatigue. He has occasional small bright red blood per rectum; I have spoken with Dr.Hayes' office now and confirmed that patient is due colonoscopy next month (April 2013) which he will call to schedule.   Mr.Ruvalcaba had restaging CTabdomen/pelvis in [place] system 03-25-2011, which did not show any acute problems but does have dilated bowel in the region of the ileocolic anastomosis, which is in the area of the villous mass; there is no apparent metastatic colon cancer. He also has numerous prominent lymph nodes in mesentery and retroperitoneum which are not enlarged but are conspicuous in number, consistent with the NHL.We have discussed the CT results now. Mr.Nwosu denies early satiety, abdominal pain, vomiting or change in bowel habits otherwise. The BRBPR is small amount every several weeks. He has dark stools from oral iron but  otherwise no unusual dark/tarry stools.  Review of Systems otherwise: no shortness of breath, no chest pain, no fever or symptoms of infection, energy good, no bleeding, no other pain except chronic symptoms with knees. Not exercising regularly and not watching diet: discussed and he plans to increase golfing, is aware of Y programs available thru [facility]. Remainder of 10 point Review of Systems negative.  Objective:  Vital signs in last 24 hours:  BP 125/62  Pulse 81  Temp(Src) 97.6 F (36.4 C) (Oral)  Wt 243 lb 4.8 oz (110.36 kg) Weight is down 3.5 lbs from Dec. Easily ambulatory, looks comfortable.   HEENT:mucous membranes moist, pharynx normal without lesions. PERRL. LymphaticsCervical, supraclavicular, and axillary nodes normal. Resp: clear to auscultation bilaterally and normal percussion bilaterally Back with nontender subcutaneous cyst left lateral poster chest, no erythema. Cardio: regular rate and rhythm GI: soft, non-tender; bowel sounds normal; no masses,  no organomegaly. Obese. Surgical scars well-healed Extremities: extremities normal, atraumatic, no cyanosis or edema Neuro: CN, motor, sensory nonfocal    Lab Results: Labs done 03-22-2011 : WBC 6.2, ANC 4.3, plt 198k, Hgb 13, MCV 88.4 LDH 135 CEA 1.5  BMET Full CMET normal with exception of glucose 120 Studies/Results: CXR 02-2010 not remarkable. CT AP 03-25-11 as above Medications: I have reviewed the patient's current medications. He will continue oral iron. Assessment/Plan: 1.follicular NHL: counts and CT findings stable, not requiring any intervention now.  2. Iron deficiency anemia: GI blood loss may be from the villous growth at anastomotic area. Now post IV iron replacement in Feb 2013. For repeat colonoscopy by Dr.Hayes upcoming. 3.remote history of synchronous colon cancers  4.diabetes 5.obesity 6.CAD, previous MI 7.carotid artery disease with vision effects on right  I will see him back in 6 months  or sooner if needed.  Kaileigh Viswanathan P, MD   04/01/2011, 11:02 AM

## 2011-04-04 ENCOUNTER — Telehealth: Payer: Self-pay | Admitting: Oncology

## 2011-04-04 NOTE — Telephone Encounter (Signed)
called pt and scheduled appt for 09/23

## 2011-09-30 ENCOUNTER — Ambulatory Visit (HOSPITAL_BASED_OUTPATIENT_CLINIC_OR_DEPARTMENT_OTHER): Payer: Medicare Other | Admitting: Oncology

## 2011-09-30 ENCOUNTER — Encounter: Payer: Self-pay | Admitting: Oncology

## 2011-09-30 ENCOUNTER — Telehealth: Payer: Self-pay | Admitting: Oncology

## 2011-09-30 ENCOUNTER — Telehealth: Payer: Self-pay | Admitting: *Deleted

## 2011-09-30 ENCOUNTER — Other Ambulatory Visit (HOSPITAL_BASED_OUTPATIENT_CLINIC_OR_DEPARTMENT_OTHER): Payer: Medicare Other | Admitting: Lab

## 2011-09-30 VITALS — BP 140/77 | HR 75 | Temp 98.8°F | Resp 18 | Ht 69.0 in | Wt 251.0 lb

## 2011-09-30 DIAGNOSIS — D509 Iron deficiency anemia, unspecified: Secondary | ICD-10-CM

## 2011-09-30 DIAGNOSIS — C8290 Follicular lymphoma, unspecified, unspecified site: Secondary | ICD-10-CM

## 2011-09-30 DIAGNOSIS — C8589 Other specified types of non-Hodgkin lymphoma, extranodal and solid organ sites: Secondary | ICD-10-CM

## 2011-09-30 DIAGNOSIS — E119 Type 2 diabetes mellitus without complications: Secondary | ICD-10-CM

## 2011-09-30 DIAGNOSIS — Z85038 Personal history of other malignant neoplasm of large intestine: Secondary | ICD-10-CM

## 2011-09-30 DIAGNOSIS — C8590 Non-Hodgkin lymphoma, unspecified, unspecified site: Secondary | ICD-10-CM

## 2011-09-30 DIAGNOSIS — C8299 Follicular lymphoma, unspecified, extranodal and solid organ sites: Secondary | ICD-10-CM

## 2011-09-30 LAB — CBC WITH DIFFERENTIAL/PLATELET
BASO%: 0.8 % (ref 0.0–2.0)
Eosinophils Absolute: 0 10*3/uL (ref 0.0–0.5)
HCT: 37.3 % — ABNORMAL LOW (ref 38.4–49.9)
HGB: 11.9 g/dL — ABNORMAL LOW (ref 13.0–17.1)
MCHC: 31.8 g/dL — ABNORMAL LOW (ref 32.0–36.0)
MONO#: 0.5 10*3/uL (ref 0.1–0.9)
NEUT#: 4.4 10*3/uL (ref 1.5–6.5)
NEUT%: 68.3 % (ref 39.0–75.0)
WBC: 6.4 10*3/uL (ref 4.0–10.3)
lymph#: 1.4 10*3/uL (ref 0.9–3.3)

## 2011-09-30 LAB — IRON AND TIBC
%SAT: 7 % — ABNORMAL LOW (ref 20–55)
Iron: 27 ug/dL — ABNORMAL LOW (ref 42–165)
TIBC: 379 ug/dL (ref 215–435)

## 2011-09-30 NOTE — Progress Notes (Signed)
OFFICE PROGRESS NOTE   09/30/2011   Physicians:J.Madilyn Fireman, K.Hussain, H.Smith, M.Ottelin, H.Derrell Lolling    INTERVAL HISTORY:  Patient is seen, alone for visit, in scheduled follow up of his history of low grade NHL and remote colon cancer; he also has an abnormality in the ileum followed closely by Dr Madilyn Fireman and not known malignant, as well as previous iron deficiency. History is of synchronous colon carcinomas in 1992, treated with subtotal colectomy and adjuvant 5FU with levamisole, not known recurrent. He was found to have low grade, follicular NHL in Nov 2005 at laparotomy with biopsy of abdominal adenopathy which had been found incidentally on CT. He has not required any treatment of the NHL. The iron deficiency was found in Jan 2012 because of drop in hgb from usual 13.4 in June 2011 to 12.4. He had colonoscopy by Dr.Hayes in April 2012 with villous mass in the ileum, biopsies negative for malignancy then, path with "inflamed polypoid granulation-type tissue"; he had repeat colonoscopy by Dr Madilyn Fireman [phone], that report received after visit today and to be entered into this EMR. There were no new findings on the colonoscopy 06-2011. Patient does continue oral iron; last IV feraheme was 02-2011.  Mark West reports intermittent leg cramps, which he believes are due to long distance driving with his work. He has seen no bleeding. He has lost all except peripheral vision OD, is receiving "shots in eye" ongoing. He has had no recent infectious illness, fever or sweats. He has had no chest pain or other cardiac symptoms. He is not exercising regularly. He has no new or different pain. He is not aware of any bleeding. He does notice increase in fatigue. Remainder of 10 point Review of Systems negative.  Objective:  Vital signs in last 24 hours:  BP 140/77  Pulse 75  Temp 98.8 F (37.1 C) (Oral)  Resp 18  Ht 5\' 9"  (1.753 m)  Wt 251 lb (113.853 kg)  BMI 37.07 kg/m2 Weight is up 8 lbs from 03-2011.  Ambulatory, NAD, alert and appropriate, very pleasant as always.   HEENT:PERRLA, sclera clear, anicteric and oropharynx clear, no lesions LymphaticsCervical, supraclavicular, and axillary nodes normal. Resp: clear to auscultation bilaterally and normal percussion bilaterally Cardio: regular rate and rhythm GI: obese, soft, not tender, normal bowel sounds, no appreciable HSM or mass Extremities: extremities normal, atraumatic, no cyanosis or edema Neuro:nonfocal Skin without rash or ecchymosis  Lab Results:  Results for orders placed in visit on 09/30/11  CBC WITH DIFFERENTIAL      Component Value Range   WBC 6.4  4.0 - 10.3 10e3/uL   NEUT# 4.4  1.5 - 6.5 10e3/uL   HGB 11.9 (*) 13.0 - 17.1 g/dL   HCT 60.4 (*) 54.0 - 98.1 %   Platelets 223  140 - 400 10e3/uL   MCV 82.4  79.3 - 98.0 fL   MCH 26.2 (*) 27.2 - 33.4 pg   MCHC 31.8 (*) 32.0 - 36.0 g/dL   RBC 1.91  4.78 - 2.95 10e6/uL   RDW 15.9 (*) 11.0 - 14.6 %   lymph# 1.4  0.9 - 3.3 10e3/uL   MONO# 0.5  0.1 - 0.9 10e3/uL   Eosinophils Absolute 0.0  0.0 - 0.5 10e3/uL   Basophils Absolute 0.1  0.0 - 0.1 10e3/uL   NEUT% 68.3  39.0 - 75.0 %   LYMPH% 22.5  14.0 - 49.0 %   MONO% 8.0  0.0 - 14.0 %   EOS% 0.4  0.0 - 7.0 %  BASO% 0.8  0.0 - 2.0 %   HGB is down from 13 in march, other counts stable Resulting after visit: ferritin 5, iron 27, %sat 7  CMET resulted after visit: glucose 172, Tprot 6, alb 3.4, CO2 20  Studies/Results: CT AP 03-25-11 compared with 03-2009 1. Status post subtotal colectomy with mild dilatation of the  ileocolic anastomosis in the left lower quadrant of the abdomen  which overall appears relatively similar to prior study 04/05/2009.  No signs to suggest local recurrence of disease or metastatic  disease within the abdomen or pelvis.  2. Multiple prominent mesenteric and retroperitoneal lymph nodes,  similar to prior study, presumably reactive.  3. Extensive atherosclerosis of the abdominal and pelvic    vasculature, without evidence of aneurysm or dissection.  4. Multiple tiny sub-centimeter low attenuation lesions in the  kidneys bilaterally are similar to the prior examination and are  too small to characterize. There is also a 1.2 x 1.5 cm cyst in  the interpolar region of the left kidney which is very similar.  Original Report Authenticated By: Florencia Reasons, M.D.   Medications: I have reviewed the patient's current medications.  Assessment/Plan: 1. Follicular NHL: clinically stable, no treatment required as yet. 2.remote synchronous colon cancers not recurrent 3.iron deficiency anemia: may be GI loss related to the villous growth at the anastomotic area. Recent colonoscopy reportedly stable without active bleeding then. With hemoglobin dropping and iron stores still very low despite oral iron, will retreat with IV iron in next few weeks. 4. Diabetes 5.CAD, previous MI. Carotid artery disease 6.obesity 7.vision loss OD  I will see him back at least in January, or sooner if needed. I believe that he has PE with PCP in interim.    Mark West P, MD   09/30/2011, 1:20 PM

## 2011-09-30 NOTE — Telephone Encounter (Signed)
appts made and printed for pt pt aware that we will call with iron

## 2011-09-30 NOTE — Telephone Encounter (Signed)
Per staff message and POF I have scheduled appt.  JMW  

## 2011-09-30 NOTE — Patient Instructions (Signed)
We will let you know about iron studies sent today. If low you will need the IV iron again.  Increase exercise.  Discuss medications with PCP in regards to leg cramps.

## 2011-10-01 ENCOUNTER — Telehealth: Payer: Self-pay

## 2011-10-01 NOTE — Telephone Encounter (Signed)
Told Mr. Strait that his iron is low again and Dr. Darrold Span wants him to receive Iron on Friday 10-04-11 at 0800 as scheduled.  He also needs lab work done to see if there is any reason for the leg cramps. Dr. Darrold Span received the colonoscopy report from Dr. Madilyn Fireman done in June 2013.  Everything looked the same and he could not see any bleeding then.  Dr. Madilyn Fireman has recommended a repeat colonoscopy in 1 year which will be June of 2014.  Pt. Verbalized understanding.

## 2011-10-04 ENCOUNTER — Ambulatory Visit: Payer: Medicare Other | Admitting: Oncology

## 2011-10-04 ENCOUNTER — Ambulatory Visit (HOSPITAL_BASED_OUTPATIENT_CLINIC_OR_DEPARTMENT_OTHER): Payer: Medicare Other

## 2011-10-04 ENCOUNTER — Other Ambulatory Visit (HOSPITAL_BASED_OUTPATIENT_CLINIC_OR_DEPARTMENT_OTHER): Payer: Medicare Other | Admitting: Lab

## 2011-10-04 ENCOUNTER — Other Ambulatory Visit: Payer: Medicare Other | Admitting: Lab

## 2011-10-04 VITALS — BP 146/76 | HR 76 | Temp 98.0°F | Resp 20

## 2011-10-04 DIAGNOSIS — C8290 Follicular lymphoma, unspecified, unspecified site: Secondary | ICD-10-CM

## 2011-10-04 DIAGNOSIS — D509 Iron deficiency anemia, unspecified: Secondary | ICD-10-CM

## 2011-10-04 DIAGNOSIS — C8299 Follicular lymphoma, unspecified, extranodal and solid organ sites: Secondary | ICD-10-CM

## 2011-10-04 LAB — COMPREHENSIVE METABOLIC PANEL (CC13)
AST: 17 U/L (ref 5–34)
Alkaline Phosphatase: 59 U/L (ref 40–150)
BUN: 16 mg/dL (ref 7.0–26.0)
Glucose: 172 mg/dl — ABNORMAL HIGH (ref 70–99)
Potassium: 4.9 mEq/L (ref 3.5–5.1)
Sodium: 138 mEq/L (ref 136–145)
Total Bilirubin: 0.4 mg/dL (ref 0.20–1.20)
Total Protein: 6 g/dL — ABNORMAL LOW (ref 6.4–8.3)

## 2011-10-04 MED ORDER — SODIUM CHLORIDE 0.9 % IV SOLN
INTRAVENOUS | Status: DC
  Administered 2011-10-04: 09:00:00 via INTRAVENOUS

## 2011-10-04 MED ORDER — SODIUM CHLORIDE 0.9 % IV SOLN
1020.0000 mg | Freq: Once | INTRAVENOUS | Status: AC
  Administered 2011-10-04: 1020 mg via INTRAVENOUS
  Filled 2011-10-04: qty 34

## 2011-10-04 NOTE — Patient Instructions (Signed)
Patient aware of next appointment; discharged home with no complaints. 

## 2011-10-07 ENCOUNTER — Telehealth: Payer: Self-pay

## 2011-10-07 NOTE — Telephone Encounter (Signed)
Faxed labs from 10-04-11 to Dr. Donette Larry as requested by Dr. Darrold Span.

## 2011-10-08 ENCOUNTER — Encounter: Payer: Self-pay | Admitting: Oncology

## 2012-01-13 ENCOUNTER — Encounter: Payer: Self-pay | Admitting: Oncology

## 2012-01-13 ENCOUNTER — Telehealth: Payer: Self-pay | Admitting: Oncology

## 2012-01-13 ENCOUNTER — Ambulatory Visit (HOSPITAL_BASED_OUTPATIENT_CLINIC_OR_DEPARTMENT_OTHER): Payer: Medicare Other | Admitting: Oncology

## 2012-01-13 ENCOUNTER — Other Ambulatory Visit (HOSPITAL_BASED_OUTPATIENT_CLINIC_OR_DEPARTMENT_OTHER): Payer: Medicare Other | Admitting: Lab

## 2012-01-13 VITALS — BP 144/86 | HR 74 | Temp 97.7°F | Resp 20 | Ht 69.0 in | Wt 260.0 lb

## 2012-01-13 DIAGNOSIS — E669 Obesity, unspecified: Secondary | ICD-10-CM

## 2012-01-13 DIAGNOSIS — D509 Iron deficiency anemia, unspecified: Secondary | ICD-10-CM

## 2012-01-13 DIAGNOSIS — C8299 Follicular lymphoma, unspecified, extranodal and solid organ sites: Secondary | ICD-10-CM

## 2012-01-13 DIAGNOSIS — C8290 Follicular lymphoma, unspecified, unspecified site: Secondary | ICD-10-CM

## 2012-01-13 DIAGNOSIS — Z85038 Personal history of other malignant neoplasm of large intestine: Secondary | ICD-10-CM

## 2012-01-13 LAB — CBC WITH DIFFERENTIAL/PLATELET
Eosinophils Absolute: 0 10*3/uL (ref 0.0–0.5)
HCT: 37.7 % — ABNORMAL LOW (ref 38.4–49.9)
LYMPH%: 22.1 % (ref 14.0–49.0)
MCV: 85.9 fL (ref 79.3–98.0)
MONO#: 0.6 10*3/uL (ref 0.1–0.9)
MONO%: 9.3 % (ref 0.0–14.0)
NEUT#: 4.6 10*3/uL (ref 1.5–6.5)
NEUT%: 67.4 % (ref 39.0–75.0)
Platelets: 232 10*3/uL (ref 140–400)
WBC: 6.9 10*3/uL (ref 4.0–10.3)

## 2012-01-13 LAB — COMPREHENSIVE METABOLIC PANEL (CC13)
BUN: 19 mg/dL (ref 7.0–26.0)
CO2: 25 mEq/L (ref 22–29)
Creatinine: 1 mg/dL (ref 0.7–1.3)
Glucose: 157 mg/dl — ABNORMAL HIGH (ref 70–99)
Total Bilirubin: 0.31 mg/dL (ref 0.20–1.20)

## 2012-01-13 LAB — IRON AND TIBC
%SAT: 5 % — ABNORMAL LOW (ref 20–55)
Iron: 20 ug/dL — ABNORMAL LOW (ref 42–165)
TIBC: 365 ug/dL (ref 215–435)
UIBC: 345 ug/dL (ref 125–400)

## 2012-01-13 NOTE — Telephone Encounter (Signed)
Gave pt appt for May 2014 lab and MD °

## 2012-01-13 NOTE — Progress Notes (Signed)
OFFICE PROGRESS NOTE   01/13/2012   Physicians: K.Husain, J.Hayes, H.Smith, M.Ottelin, H. Grayce Sessions.Olin  INTERVAL HISTORY:   Patient is seen, alone for visit, in scheduled follow up of his history of colon cancer, low grade NHL which has not required treatment, and iron deficiency anemia which may be related to a villous mass in distal ileum, this with no malignancy on previous biopsies and stable appearance by lower endoscopy June 2013.   History is of synchronous B2 and C2 colon carcinomas in 1992, treated with subtotal colectomy and adjuvant 5FU with levamisole, not known recurrent. There was no evidence of new or locally recurrent colon cancer at colonoscopy by Dr Madilyn Fireman June 2013. He was found to have low grade, follicular NHL in Nov 2005 at exploratory laparotomy with biopsy of abdominal adenopathy which had been found incidentally on CT done because of the colon cancer history. He has not required any treatment of the NHL. The iron deficiency was found in Jan 2012 because of drop in hgb; he continues oral iron and also had IV feraheme at full dose in Feb 2013 and again in Sept 2013. He has no seen any overt bleeding. Last CT AP and CXR in [facility] was in March 2013, with some dilated bowel in region of the ileocolic anastomosis, which is in the region of the ileal mass, and prominent # of nonenlarged LN in mesentery and retroperitoneum.   Mark West had upper and lower respiratory infection for several weeks in late Nov into Dec, treated with Z packs x 2 with resolution. He had PE by Dr Donette Larry late Dec, 6 month follow up there. He has had more problems with knee, to point that he is unable to tolerate any regular exercise; he is to see Dr Charlann Boxer upcoming. He has had less LE cramps, but has had "cramps" across upper abdomen when he bends over. This abdominal discomfort is position dependent tho patient is not aware of back pain.  He has had no bleeding. He is not watching diet, and not  exercising, tho he dislikes the weight gain and would like to address this. No present respiratory symptoms, no chest pain or other cardiac symptoms, bowels at baseline, no LE swelling. He has lost all except slight peripheral vision on right, is still under treatment to preserve that vision but understands no improvement is expected. Remainder of 10 point Review of Systems negative.  Objective:  Vital signs in last 24 hours:  BP 144/86  Pulse 74  Temp 97.7 F (36.5 C) (Oral)  Resp 20  Ht 5\' 9"  (1.753 m)  Wt 260 lb (117.935 kg)  BMI 38.40 kg/m2 Weight is up 9 lbs from Sept. Easily ambulatory, looks comfortable, respirations not labored. Able to get on and off exam table and change positions there without difficulty.  HEENT:PERRLA, sclera clear, anicteric, oropharynx clear, no lesions and neck supple with midline trachea LymphaticsCervical, supraclavicular, and axillary nodes normal. Resp: clear to auscultation bilaterally and normal percussion bilaterally Cardio: regular rate and rhythm GI: obese, soft, nontender, no appreciable mass or HSM, normal BS. No tenderness upper quadrants bilaterally.  Extremities: extremities normal, atraumatic, no cyanosis or edema Neuro:no sensory deficits noted Back nontender to palpation Skin without rash or ecchymosis  Lab Results:  Results for orders placed in visit on 01/13/12  CBC WITH DIFFERENTIAL      Component Value Range   WBC 6.9  4.0 - 10.3 10e3/uL   NEUT# 4.6  1.5 - 6.5 10e3/uL  HGB 12.1 (*) 13.0 - 17.1 g/dL   HCT 16.1 (*) 09.6 - 04.5 %   Platelets 232  140 - 400 10e3/uL   MCV 85.9  79.3 - 98.0 fL   MCH 27.6  27.2 - 33.4 pg   MCHC 32.1  32.0 - 36.0 g/dL   RBC 4.09  8.11 - 9.14 10e6/uL   RDW 15.6 (*) 11.0 - 14.6 %   lymph# 1.5  0.9 - 3.3 10e3/uL   MONO# 0.6  0.1 - 0.9 10e3/uL   Eosinophils Absolute 0.0  0.0 - 0.5 10e3/uL   Basophils Absolute 0.1  0.0 - 0.1 10e3/uL   NEUT% 67.4  39.0 - 75.0 %   LYMPH% 22.1  14.0 - 49.0 %    MONO% 9.3  0.0 - 14.0 %   EOS% 0.4  0.0 - 7.0 %   BASO% 0.8  0.0 - 2.0 %  COMPREHENSIVE METABOLIC PANEL (CC13)      Component Value Range   Sodium 141  136 - 145 mEq/L   Potassium 4.5  3.5 - 5.1 mEq/L   Chloride 108 (*) 98 - 107 mEq/L   CO2 25  22 - 29 mEq/L   Glucose 157 (*) 70 - 99 mg/dl   BUN 78.2  7.0 - 95.6 mg/dL   Creatinine 1.0  0.7 - 1.3 mg/dL   Total Bilirubin 2.13  0.20 - 1.20 mg/dL   Alkaline Phosphatase 61  40 - 150 U/L   AST 17  5 - 34 U/L   ALT 21  0 - 55 U/L   Total Protein 6.3 (*) 6.4 - 8.3 g/dL   Albumin 3.3 (*) 3.5 - 5.0 g/dL   Calcium 9.4  8.4 - 08.6 mg/dL  Hgb was 57.8 in Sept, 13 in March, 12.8 a year ago. MCV is up from 82 in Sept.   Serum iron returned low at 20, 5% sat.  This had been 27/7% in Sept prior to [facility] and 38/12% a year ago. Studies/Results:  No results found.  Medications: I have reviewed the patient's current medications.  Assessment/Plan:  1.follicular NHL: counts and CT findings stable, not requiring any intervention now.  2. Iron deficiency anemia: apparently from slight ongoing GI blood loss. With Hgb stable and as he is now compliant with oral iron, and as he has had IV iron x2 in past year, will continue oral iron for now and repeat CBC here in May, or sooner if needed. 3.remote history of synchronous colon cancers, not known active  4.diabetes  5.obesity with progressive weight gain: discussed 6.CAD, previous MI  7.carotid artery disease with vision loss OD 8.degenerative knee symptoms: orthopedics evaluation upcoming.   Patient was comfortable with discussion and plan at time of visit; we will let him know plan re iron as above.   Mark West,Mark P, MD   01/13/2012, 3:16 PM

## 2012-01-13 NOTE — Patient Instructions (Signed)
Appointments as scheduled. 

## 2012-01-22 ENCOUNTER — Telehealth: Payer: Self-pay

## 2012-01-22 NOTE — Telephone Encounter (Signed)
Told Mark West lab results as noted below by Dr. Darrold Span.  He states that he is taking the iron bid as directed.

## 2012-01-22 NOTE — Telephone Encounter (Signed)
Message copied by Lorine Bears on Wed Jan 22, 2012 11:07 AM ------      Message from: Reece Packer      Created: Sun Jan 19, 2012  1:14 PM       Labs seen and need follow up: please let him know iron studies again are a little low, but with hemoglobin a little better and as he has had IV iron x 2 in past year, I think ok to continue oral iron very regularly until I see him again in May.

## 2012-01-28 ENCOUNTER — Telehealth: Payer: Self-pay | Admitting: Oncology

## 2012-01-28 NOTE — Telephone Encounter (Signed)
s/w pt and moved his 5/5 appt to 5/6 as dr ll will be out of the office      anne

## 2012-05-11 ENCOUNTER — Other Ambulatory Visit: Payer: Medicare Other | Admitting: Lab

## 2012-05-11 ENCOUNTER — Ambulatory Visit: Payer: Medicare Other | Admitting: Oncology

## 2012-05-12 ENCOUNTER — Telehealth: Payer: Self-pay | Admitting: Oncology

## 2012-05-12 ENCOUNTER — Other Ambulatory Visit (HOSPITAL_BASED_OUTPATIENT_CLINIC_OR_DEPARTMENT_OTHER): Payer: Medicare Other | Admitting: Lab

## 2012-05-12 ENCOUNTER — Encounter: Payer: Self-pay | Admitting: Oncology

## 2012-05-12 ENCOUNTER — Ambulatory Visit (HOSPITAL_BASED_OUTPATIENT_CLINIC_OR_DEPARTMENT_OTHER): Payer: Medicare Other | Admitting: Oncology

## 2012-05-12 VITALS — BP 142/76 | HR 65 | Temp 98.1°F | Resp 18 | Ht 69.0 in | Wt 261.6 lb

## 2012-05-12 DIAGNOSIS — C8290 Follicular lymphoma, unspecified, unspecified site: Secondary | ICD-10-CM

## 2012-05-12 DIAGNOSIS — C8299 Follicular lymphoma, unspecified, extranodal and solid organ sites: Secondary | ICD-10-CM

## 2012-05-12 DIAGNOSIS — D509 Iron deficiency anemia, unspecified: Secondary | ICD-10-CM

## 2012-05-12 DIAGNOSIS — Z85038 Personal history of other malignant neoplasm of large intestine: Secondary | ICD-10-CM

## 2012-05-12 DIAGNOSIS — R109 Unspecified abdominal pain: Secondary | ICD-10-CM

## 2012-05-12 DIAGNOSIS — D500 Iron deficiency anemia secondary to blood loss (chronic): Secondary | ICD-10-CM | POA: Insufficient documentation

## 2012-05-12 LAB — CBC WITH DIFFERENTIAL/PLATELET
Basophils Absolute: 0 10*3/uL (ref 0.0–0.1)
EOS%: 0.8 % (ref 0.0–7.0)
Eosinophils Absolute: 0.1 10*3/uL (ref 0.0–0.5)
HCT: 37 % — ABNORMAL LOW (ref 38.4–49.9)
HGB: 11.7 g/dL — ABNORMAL LOW (ref 13.0–17.1)
MONO#: 0.5 10*3/uL (ref 0.1–0.9)
NEUT#: 4.8 10*3/uL (ref 1.5–6.5)
NEUT%: 68.5 % (ref 39.0–75.0)
RDW: 20.3 % — ABNORMAL HIGH (ref 11.0–14.6)
WBC: 7 10*3/uL (ref 4.0–10.3)
lymph#: 1.6 10*3/uL (ref 0.9–3.3)

## 2012-05-12 LAB — COMPREHENSIVE METABOLIC PANEL (CC13)
ALT: 24 U/L (ref 0–55)
AST: 18 U/L (ref 5–34)
Albumin: 3.3 g/dL — ABNORMAL LOW (ref 3.5–5.0)
Alkaline Phosphatase: 59 U/L (ref 40–150)
Potassium: 4.7 mEq/L (ref 3.5–5.1)
Sodium: 140 mEq/L (ref 136–145)
Total Bilirubin: 0.43 mg/dL (ref 0.20–1.20)
Total Protein: 6.4 g/dL (ref 6.4–8.3)

## 2012-05-12 NOTE — Progress Notes (Signed)
OFFICE PROGRESS NOTE   05/12/2012   Physicians:K.Husain, J.Hayes, H.Smith, M.Ottelin, H. Grayce Sessions.Olin   INTERVAL HISTORY:   Patient is seen, alone for visit, in continuing attention to iron deficiency anemia thought related to GI blood loss from villous mass in distal ileum;last feraheme was Sept 2013 and he continues oral iron. Last iron studies were still low in Jan 2014 (serum iron 20, %sat 5 and ferritin 5). He also has low grade NHL which has not required treatment, and remote history of node positive colon cancer not known recurrrent.  History is of synchronous B2 and C2 colon carcinomas in 1992, treated with subtotal colectomy and adjuvant 5FU with levamisole, not known recurrent. There was no evidence of new or locally recurrent colon cancer at colonoscopy by Dr Madilyn Fireman June 2013. He was found to have low grade, follicular NHL in Nov 2005 at exploratory laparotomy with biopsy of abdominal adenopathy which had been found incidentally on CT done because of the colon cancer history. He has not required any treatment of the NHL. The iron deficiency was found in Jan 2012; he continues oral iron and also had IV feraheme at full dose in Feb 2013 and again in Sept 2013. Last CT AP and CXR in [facility] was in March 2013, with some dilated bowel in region of the ileocolic anastomosis, which is in the region of the ileal mass, and prominent # of nonenlarged LN in mesentery and retroperitoneum. On colonoscopies recently, he has a villoous mass in distal ileum which has not been malignant on biopsies and appeared stable by Dr Madilyn Fireman' exam in June 2013.     Patient had upper respiratory infections x 3 this winter, resolved with antibiotics. He has seen no bleeding and has had no change in bowels. He has significant cramping discomfort in RUQ and left mid abdomen when he bends over, which does not seem referred from back and with no discomfort in abdomen otherwise. He had nonsteroid injection to  left knee by [facility] which was very helpful. He has no vision now OD. He denies any bleeding, other pain, cough. He is trying to address his weight. Remainder of 10 point Review of Systems negative.  Objective:  Vital signs in last 24 hours:  BP 142/76  Pulse 65  Temp(Src) 98.1 F (36.7 C) (Oral)  Resp 18  Ht 5\' 9"  (1.753 m)  Wt 261 lb 9.6 oz (118.661 kg)  BMI 38.61 kg/m2  Weight is stable from Jan. More easily ambulatory with knee improved. Alert, NAD. Obese.  HEENT:sclera clear, anicteric, oropharynx clear, no lesions and neck supple with midline trachea LymphaticsCervical, supraclavicular, and axillary nodes normal. Resp: clear to auscultation bilaterally and normal percussion bilaterally Cardio: regular rate and rhythm GI: obese, soft, not tender including RUQ and left mid abdomen, no appreciable mass or organomegaly Extremities: extremities normal, atraumatic, no cyanosis or edema Neuro: blind OD Skin: 1 cm slightly scaly erythematous area just to right of spine on upper back concerning for skin ca. No rash or ecchymosis Lab Results:  Results for orders placed in visit on 05/12/12  CBC WITH DIFFERENTIAL      Result Value Range   WBC 7.0  4.0 - 10.3 10e3/uL   NEUT# 4.8  1.5 - 6.5 10e3/uL   HGB 11.7 (*) 13.0 - 17.1 g/dL   HCT 72.5 (*) 36.6 - 44.0 %   Platelets 220  140 - 400 10e3/uL   MCV 78.0 (*) 79.3 - 98.0 fL   MCH 24.5 (*)  27.2 - 33.4 pg   MCHC 31.5 (*) 32.0 - 36.0 g/dL   RBC 4.09  8.11 - 9.14 10e6/uL   RDW 20.3 (*) 11.0 - 14.6 %   lymph# 1.6  0.9 - 3.3 10e3/uL   MONO# 0.5  0.1 - 0.9 10e3/uL   Eosinophils Absolute 0.1  0.0 - 0.5 10e3/uL   Basophils Absolute 0.0  0.0 - 0.1 10e3/uL   NEUT% 68.5  39.0 - 75.0 %   LYMPH% 22.4  14.0 - 49.0 %   MONO% 7.7  0.0 - 14.0 %   EOS% 0.8  0.0 - 7.0 %   BASO% 0.6  0.0 - 2.0 %  LACTATE DEHYDROGENASE (CC13)      Result Value Range   LDH 137  125 - 245 U/L  COMPREHENSIVE METABOLIC PANEL (CC13)      Result Value  Range   Sodium 140  136 - 145 mEq/L   Potassium 4.7  3.5 - 5.1 mEq/L   Chloride 108 (*) 98 - 107 mEq/L   CO2 22  22 - 29 mEq/L   Glucose 192 (*) 70 - 99 mg/dl   BUN 78.2  7.0 - 95.6 mg/dL   Creatinine 0.9  0.7 - 1.3 mg/dL   Total Bilirubin 2.13  0.20 - 1.20 mg/dL   Alkaline Phosphatase 59  40 - 150 U/L   AST 18  5 - 34 U/L   ALT 24  0 - 55 U/L   Total Protein 6.4  6.4 - 8.3 g/dL   Albumin 3.3 (*) 3.5 - 5.0 g/dL   Calcium 8.7  8.4 - 08.6 mg/dL    Hgb and MCV are lower despite continued oral iron.  Studies/Results: With abdominal pain continuing since Jan, will repeat CT CAP prior to June follow up colonoscopy   Medications: I have reviewed the patient's current medications.  I have spoken directly with Dr Madilyn Fireman' office by phone during visit today, to confirm that they will set up appointment for repeat colonoscopy June 2014. Patient agrees to IV feraheme in next couple of weeks, coordinating with his travel schedule.  Assessment/Plan:  1.follicular NHL: counts and CT findings stable, not requiring any intervention now.  2. Iron deficiency anemia: apparently from slight ongoing GI blood loss. Hemoglobin dropping. Will repeat feraheme. 3.remote history of synchronous colon cancers, not known active  4.diabetes  5.obesity with progressive weight gain: discussed  6.CAD, previous MI  7.carotid artery disease with vision loss OD  8.degenerative arthritis left knee 9.abdominal pain which is positional: with NHL, the villous mass in ileum and other history, will repeat CT as above, then visit back with repeat CBC after scans.   Patient is in agreement with plan.     LIVESAY,LENNIS P, MD   05/12/2012, 9:50 AM

## 2012-05-12 NOTE — Patient Instructions (Signed)
You will be due colonoscopy by Dr Madilyn Fireman this June. His office will be in touch with you to schedule.

## 2012-05-25 ENCOUNTER — Ambulatory Visit (HOSPITAL_COMMUNITY)
Admission: RE | Admit: 2012-05-25 | Discharge: 2012-05-25 | Disposition: A | Discharge status: Home / Self Care | Payer: Medicare Other | Source: Ambulatory Visit | Attending: Oncology | Admitting: Oncology

## 2012-05-25 ENCOUNTER — Telehealth: Payer: Self-pay | Admitting: *Deleted

## 2012-05-25 ENCOUNTER — Encounter (HOSPITAL_COMMUNITY): Payer: Self-pay

## 2012-05-25 DIAGNOSIS — C8290 Follicular lymphoma, unspecified, unspecified site: Secondary | ICD-10-CM

## 2012-05-25 DIAGNOSIS — C189 Malignant neoplasm of colon, unspecified: Secondary | ICD-10-CM | POA: Insufficient documentation

## 2012-05-25 DIAGNOSIS — R911 Solitary pulmonary nodule: Secondary | ICD-10-CM | POA: Insufficient documentation

## 2012-05-25 DIAGNOSIS — Z9049 Acquired absence of other specified parts of digestive tract: Secondary | ICD-10-CM | POA: Insufficient documentation

## 2012-05-25 DIAGNOSIS — D509 Iron deficiency anemia, unspecified: Secondary | ICD-10-CM

## 2012-05-25 DIAGNOSIS — R599 Enlarged lymph nodes, unspecified: Secondary | ICD-10-CM | POA: Insufficient documentation

## 2012-05-25 NOTE — Telephone Encounter (Signed)
Pt called re: note below. Will come for labs and feraheme 5/20. Will call us after he schedules colonoscopy to reschedule labs/LL.

## 2012-05-25 NOTE — Telephone Encounter (Signed)
Message copied by Phillis Knack on Mon May 25, 2012  4:50 PM ------      Message from: Jama Flavors P      Created: Mon May 25, 2012  4:07 PM       Labs seen and need follow up: please let him know that the CTs are stable compared with scans last year, with no increase in lymph nodes and no other new or different problems that we can tell, which is good news.  I see he is for labs and feraheme on 5-20, then to see me with labs 5-27. If he would like, we can move the MD visit and follow up labs out a few months, maybe after his repeat colonoscopy ------

## 2012-05-26 ENCOUNTER — Ambulatory Visit (HOSPITAL_BASED_OUTPATIENT_CLINIC_OR_DEPARTMENT_OTHER): Payer: Medicare Other

## 2012-05-26 ENCOUNTER — Other Ambulatory Visit (HOSPITAL_COMMUNITY): Payer: Medicare Other

## 2012-05-26 VITALS — BP 157/85 | HR 74 | Temp 98.1°F

## 2012-05-26 DIAGNOSIS — D509 Iron deficiency anemia, unspecified: Secondary | ICD-10-CM

## 2012-05-26 MED ORDER — SODIUM CHLORIDE 0.9 % IV SOLN
1020.0000 mg | Freq: Once | INTRAVENOUS | Status: AC
  Administered 2012-05-26: 1020 mg via INTRAVENOUS
  Filled 2012-05-26: qty 34

## 2012-05-26 NOTE — Patient Instructions (Addendum)
Ferumoxytol injection What is this medicine? FERUMOXYTOL is an iron complex. Iron is used to make healthy red blood cells, which carry oxygen and nutrients throughout the body. This medicine is used to treat iron deficiency anemia in people with chronic kidney disease. This medicine may be used for other purposes; ask your health care provider or pharmacist if you have questions. What should I tell my health care provider before I take this medicine? They need to know if you have any of these conditions: -anemia not caused by low iron levels -high levels of iron in the blood -magnetic resonance imaging (MRI) test scheduled -an unusual or allergic reaction to iron, other medicines, foods, dyes, or preservatives -pregnant or trying to get pregnant -breast-feeding How should I use this medicine? This medicine is for infusion into a vein. It is given by a health care professional in a hospital or clinic setting. Talk to your pediatrician regarding the use of this medicine in children. Special care may be needed. Overdosage: If you think you've taken too much of this medicine contact a poison control center or emergency room at once. Overdosage: If you think you have taken too much of this medicine contact a poison control center or emergency room at once. NOTE: This medicine is only for you. Do not share this medicine with others. What if I miss a dose? It is important not to miss your dose. Call your doctor or health care professional if you are unable to keep an appointment. What may interact with this medicine? This medicine may interact with the following medications: -other iron products This list may not describe all possible interactions. Give your health care provider a list of all the medicines, herbs, non-prescription drugs, or dietary supplements you use. Also tell them if you smoke, drink alcohol, or use illegal drugs. Some items may interact with your medicine. What should I watch  for while using this medicine? Visit your doctor or healthcare professional regularly. Tell your doctor or healthcare professional if your symptoms do not start to get better or if they get worse. You may need blood work done while you are taking this medicine. You may need to follow a special diet. Talk to your doctor. Foods that contain iron include: whole grains/cereals, dried fruits, beans, or peas, leafy green vegetables, and organ meats (liver, kidney). What side effects may I notice from receiving this medicine? Side effects that you should report to your doctor or health care professional as soon as possible: -allergic reactions like skin rash, itching or hives, swelling of the face, lips, or tongue -breathing problems -changes in blood pressure -feeling faint or lightheaded, falls -fever or chills -flushing, sweating, or hot feelings -swelling of the ankles or feet Side effects that usually do not require medical attention (Report these to your doctor or health care professional if they continue or are bothersome.): -diarrhea -headache -nausea, vomiting -stomach pain This list may not describe all possible side effects. Call your doctor for medical advice about side effects. You may report side effects to FDA at 1-800-FDA-1088. Where should I keep my medicine? This drug is given in a hospital or clinic and will not be stored at home. NOTE: This sheet is a summary. It may not cover all possible information. If you have questions about this medicine, talk to your doctor, pharmacist, or health care provider.  2013, [employer]. (09/16/2007 9:48:25 PM)  

## 2012-06-02 ENCOUNTER — Ambulatory Visit: Payer: Medicare Other | Admitting: Oncology

## 2012-06-02 ENCOUNTER — Other Ambulatory Visit: Payer: Medicare Other | Admitting: Lab

## 2012-06-18 ENCOUNTER — Telehealth: Payer: Self-pay | Admitting: Oncology

## 2012-06-18 ENCOUNTER — Other Ambulatory Visit: Payer: Self-pay | Admitting: *Deleted

## 2012-06-18 NOTE — Telephone Encounter (Signed)
s.w. pt and advised on 6.20.14 appt....pt ok and aware °

## 2012-06-19 ENCOUNTER — Other Ambulatory Visit: Payer: Self-pay | Admitting: Oncology

## 2012-06-19 ENCOUNTER — Telehealth: Payer: Self-pay | Admitting: Oncology

## 2012-06-19 DIAGNOSIS — D509 Iron deficiency anemia, unspecified: Secondary | ICD-10-CM

## 2012-06-19 DIAGNOSIS — C8290 Follicular lymphoma, unspecified, unspecified site: Secondary | ICD-10-CM

## 2012-06-19 DIAGNOSIS — Z85038 Personal history of other malignant neoplasm of large intestine: Secondary | ICD-10-CM

## 2012-06-19 NOTE — Telephone Encounter (Signed)
s.w. pt and advised on 6.25.14 appt...pt requested wednesday.Marland KitchenMarland KitchenDone

## 2012-06-25 ENCOUNTER — Other Ambulatory Visit: Payer: Self-pay | Admitting: Gastroenterology

## 2012-06-26 ENCOUNTER — Encounter (HOSPITAL_COMMUNITY): Payer: Self-pay | Admitting: Emergency Medicine

## 2012-06-26 ENCOUNTER — Other Ambulatory Visit: Payer: Self-pay | Admitting: Gastroenterology

## 2012-06-26 ENCOUNTER — Emergency Department (HOSPITAL_COMMUNITY): Payer: Medicare Other

## 2012-06-26 ENCOUNTER — Emergency Department (HOSPITAL_COMMUNITY)
Admission: EM | Admit: 2012-06-26 | Discharge: 2012-06-26 | Disposition: A | Discharge status: Home / Self Care | Payer: Medicare Other | Attending: Emergency Medicine | Admitting: Emergency Medicine

## 2012-06-26 ENCOUNTER — Other Ambulatory Visit: Payer: Medicare Other | Admitting: Lab

## 2012-06-26 ENCOUNTER — Ambulatory Visit: Payer: Medicare Other | Admitting: Oncology

## 2012-06-26 DIAGNOSIS — Z9861 Coronary angioplasty status: Secondary | ICD-10-CM | POA: Insufficient documentation

## 2012-06-26 DIAGNOSIS — R109 Unspecified abdominal pain: Secondary | ICD-10-CM | POA: Insufficient documentation

## 2012-06-26 DIAGNOSIS — D509 Iron deficiency anemia, unspecified: Secondary | ICD-10-CM | POA: Insufficient documentation

## 2012-06-26 DIAGNOSIS — E119 Type 2 diabetes mellitus without complications: Secondary | ICD-10-CM | POA: Insufficient documentation

## 2012-06-26 DIAGNOSIS — Z7902 Long term (current) use of antithrombotics/antiplatelets: Secondary | ICD-10-CM | POA: Insufficient documentation

## 2012-06-26 DIAGNOSIS — Z7982 Long term (current) use of aspirin: Secondary | ICD-10-CM | POA: Insufficient documentation

## 2012-06-26 DIAGNOSIS — Z79899 Other long term (current) drug therapy: Secondary | ICD-10-CM | POA: Insufficient documentation

## 2012-06-26 DIAGNOSIS — R300 Dysuria: Secondary | ICD-10-CM | POA: Insufficient documentation

## 2012-06-26 DIAGNOSIS — Z85038 Personal history of other malignant neoplasm of large intestine: Secondary | ICD-10-CM | POA: Insufficient documentation

## 2012-06-26 DIAGNOSIS — Z9889 Other specified postprocedural states: Secondary | ICD-10-CM | POA: Insufficient documentation

## 2012-06-26 DIAGNOSIS — Z87891 Personal history of nicotine dependence: Secondary | ICD-10-CM | POA: Insufficient documentation

## 2012-06-26 LAB — COMPREHENSIVE METABOLIC PANEL
BUN: 13 mg/dL (ref 6–23)
CO2: 25 mEq/L (ref 19–32)
Calcium: 9.3 mg/dL (ref 8.4–10.5)
Creatinine, Ser: 1.02 mg/dL (ref 0.50–1.35)
GFR calc Af Amer: 85 mL/min — ABNORMAL LOW (ref >90)
GFR calc non Af Amer: 73 mL/min — ABNORMAL LOW (ref >90)
Glucose, Bld: 188 mg/dL — ABNORMAL HIGH (ref 70–99)
Total Protein: 6.9 g/dL (ref 6.0–8.3)

## 2012-06-26 LAB — URINALYSIS, ROUTINE W REFLEX MICROSCOPIC
Bilirubin Urine: NEGATIVE
Glucose, UA: NEGATIVE mg/dL
Hgb urine dipstick: NEGATIVE
Ketones, ur: NEGATIVE mg/dL
Nitrite: NEGATIVE
pH: 5 (ref 5.0–8.0)

## 2012-06-26 LAB — CBC
HCT: 40.1 % (ref 39.0–52.0)
Hemoglobin: 13.1 g/dL (ref 13.0–17.0)
MCH: 27.4 pg (ref 26.0–34.0)
MCHC: 32.7 g/dL (ref 30.0–36.0)
MCV: 83.9 fL (ref 78.0–100.0)

## 2012-06-26 LAB — LIPASE, BLOOD: Lipase: 25 U/L (ref 11–59)

## 2012-06-26 MED ORDER — OXYCODONE-ACETAMINOPHEN 5-325 MG PO TABS: 1.0000 | ORAL_TABLET | ORAL | Status: DC | PRN

## 2012-06-26 MED ORDER — HYDROMORPHONE HCL PF 1 MG/ML IJ SOLN
1.0000 mg | Freq: Once | INTRAMUSCULAR | Status: AC
  Administered 2012-06-26: 1 mg via INTRAVENOUS
  Filled 2012-06-26: qty 1

## 2012-06-26 MED ORDER — ONDANSETRON HCL 4 MG/2ML IJ SOLN
4.0000 mg | Freq: Once | INTRAMUSCULAR | Status: AC
  Administered 2012-06-26: 4 mg via INTRAVENOUS
  Filled 2012-06-26: qty 2

## 2012-06-26 NOTE — ED Notes (Addendum)
Patient c/o intermittent sharp, stabbing pains to abd. rated 5-6/10 when they occur. Patient remains unable to void at this time.

## 2012-06-26 NOTE — ED Notes (Signed)
Patient states he completed his PO contrast about 1 hour.

## 2012-06-26 NOTE — ED Notes (Signed)
Pt sent from [place]. Pt had coloscopy yesterday with polyp removal with 2 clips. Pt now c/o severe abd pain and dysuria. Pt reports normal BM this am. Pt denies blood in stool.  Pt is A&O and in NAD.

## 2012-06-26 NOTE — ED Provider Notes (Signed)
History     CSN: [id number]  Arrival date & time 06/26/12  1612   First MD Initiated Contact with Patient 06/26/12 1748      Chief Complaint  Patient presents with  . Abdominal Pain    (Consider location/radiation/quality/duration/timing/severity/associated sxs/prior treatment) HPI Pt presenting with lower abdominal pain cramping and pain after recent colonoscopy yesterday.  He had polyp removed.  No fever, no rectal bleeding.  Pain is intermittent in nature and sharp.  No vomiting.  Pain began today acutely.  There are no other associated systemic symptoms, there are no other alleviating or modifying factors. He has had some pain with urination but has not seen blood in urine or having any retention or difficulty urinating.  There are no other associated systemic symptoms, there are no other alleviating or modifying factors.   Past Medical History  Diagnosis Date  . Anemia, iron deficiency 01/26/2011  . Cancer     nhl, colon ca  . Diabetes mellitus     Past Surgical History  Procedure Laterality Date  . Coronary stent placement    . Hernia repair    . Colon surgery    . Colonoscopy    . Repair of complex traction retinal detachment      No family history on file.  History  Substance Use Topics  . Smoking status: Former Smoker    Quit date: 01/08/1983  . Smokeless tobacco: Not on file  . Alcohol Use: No      Review of Systems ROS reviewed and all otherwise negative except for mentioned in HPI  Allergies  Iodine  Home Medications   Current Outpatient Rx  Name  Route  Sig  Dispense  Refill  . aspirin 81 MG tablet   Oral   Take 81 mg by mouth daily.           Marland Kitchen atorvastatin (LIPITOR) 40 MG tablet   Oral   Take 40 mg by mouth daily.           . clopidogrel (PLAVIX) 75 MG tablet   Oral   Take 75 mg by mouth daily.           . Coenzyme Q10 (CO Q 10 PO)   Oral   Take by mouth daily.         . Fe Fum-FePoly-Vit C-Vit B3 (INTEGRA PO)   Oral    Take 324 mg by mouth 2 (two) times daily.           . Glucosamine-Chondroit-Vit C-Mn (GLUCOSAMINE 1500 COMPLEX) CAPS   Oral   Take 1 capsule by mouth daily.         Marland Kitchen lisinopril (PRINIVIL,ZESTRIL) 5 MG tablet               . metFORMIN (GLUCOPHAGE-XR) 500 MG 24 hr tablet      500 mg. Take 500 in am and 1000 mg in evening         . metoprolol tartrate (LOPRESSOR) 25 MG tablet   Oral   Take 25 mg by mouth 2 (two) times daily.           . Multiple Vitamin (MULTIVITAMIN) tablet   Oral   Take 1 tablet by mouth daily.          . Omega-3 Fatty Acids (FISH OIL) 1000 MG CAPS   Oral   Take 1 capsule by mouth daily.         . sitaGLIPtin (JANUVIA) 100 MG tablet  Oral   Take 100 mg by mouth daily.           Marland Kitchen oxyCODONE-acetaminophen (ROXICET) 5-325 MG per tablet   Oral   Take 1 tablet by mouth every 4 (four) hours as needed for pain.   20 tablet   0     BP 125/95  Pulse 70  Temp(Src) 98.2 F (36.8 C) (Oral)  Resp 18  SpO2 97% Vitals reviewed Physical Exam Physical Examination: General appearance - alert, well appearing, and in no distress Mental status - alert, oriented to person, place, and time Eyes - no conjunctival injection, no scleral icterus Mouth - mucous membranes moist, pharynx normal without lesions Chest - clear to auscultation, no wheezes, rales or rhonchi, symmetric air entry Heart - normal rate, regular rhythm, normal S1, S2, no murmurs, rubs, clicks or gallops Abdomen - soft, ttp in lower mid abdomen/suprapubic region, no gaurding or rebound, nondistended, no masses or organomegaly Extremities - peripheral pulses normal, no pedal edema, no clubbing or cyanosis Skin - normal coloration and turgor, no rashes  ED Course  Procedures (including critical care time)  Labs Reviewed  CBC - Abnormal; Notable for the following:    RDW 18.7 (*)    All other components within normal limits  COMPREHENSIVE METABOLIC PANEL - Abnormal; Notable for the  following:    Glucose, Bld 188 (*)    Albumin 3.4 (*)    GFR calc non Af Amer 73 (*)    GFR calc Af Amer 85 (*)    All other components within normal limits  URINALYSIS, ROUTINE W REFLEX MICROSCOPIC  LIPASE, BLOOD   Ct Abdomen Pelvis Wo Contrast  06/26/2012   *RADIOLOGY REPORT*  Clinical Data: Abdominal pain.  Colonoscopy yesterday with polyp removal.  CT ABDOMEN AND PELVIS WITHOUT CONTRAST  Technique:  Multidetector CT imaging of the abdomen and pelvis was performed following the standard protocol without intravenous contrast.  Comparison: 05/25/2012.  Findings: The lung bases are clear.  No pleural effusion.  No pericardial effusion.  Coronary artery calcifications are noted.  The unenhanced appearance of the liver is unremarkable.  No focal hepatic lesions or biliary dilatation.  The gallbladder is normal. No common bile duct dilatation.  The pancreas is normal.  The spleen is normal.  The adrenal glands and kidneys are unremarkable and stable.  No renal calculi, hydronephrosis or obstructing ureteral calculi.  No bladder calculi.  The stomach, duodenum and small bowel are unremarkable.  No inflammatory changes or obstruction.  There are surgical changes from a prior right colectomy.  There is a dilated medial cecum which is a stable finding.  There is a small amount of stool in the neoterminal ileum but I do not see any inflammatory changes or mass lesion.  There is stool clips in the neural cecum where the polyp was removed.  I do not see any complicating features.  No mesenteric or retroperitoneal mass, adenopathy, hematoma or abnormal fluid collections.  The aorta is normal in caliber.  There are advanced atherosclerotic calcifications.  The bladder, prostate gland and seminal vesicles are unremarkable. There is moderate stool in the rectum and sigmoid colon.  No pelvic mass, adenopathy or free pelvic fluid collection.  No inguinal mass or adenopathy.  Prominent fat and bilateral inguinal canals.   IMPRESSION:  1.  No complicating features associated with recent colonoscopy and polyp removal. 2.  Remote surgical changes involving the colon.  No findings for obstruction and/or perforation.  There are scattered stable mesenteric  and retroperitoneal lymph nodes. 3.  No renal or obstructing ureteral calculi.   Original Report Authenticated By: Rudie Meyer, M.D.   Dg Abd Acute W/chest  06/26/2012   *RADIOLOGY REPORT*  Clinical Data: Abdominal pain.  Status post colonoscopy.  ACUTE ABDOMEN SERIES (ABDOMEN 2 VIEW & CHEST 1 VIEW)  Comparison: 05/25/2012  Findings: Normal heart size.  No pleural effusion or edema identified.  There is no airspace consolidation identified.  There is a moderate amount of stool within the distal colon and rectum.  Moderate gaseous distention of the left colon is also noted.  No dilated small bowel loops identified.  No free intraperitoneal air noted.  IMPRESSION:  1.  Moderate amount of stool within distal colon and rectum identified.  There is also moderate gaseous distention of the left colon. 2.  No free air identified.   Original Report Authenticated By: Signa Kell, M.D.     1. Abdominal pain       MDM  Pt presenting after colonoscopy yesterday with acute onset of lower abdominal pain.  Labs including urine were reassuring. CT scan without acute findings- xray obtained initially to r/o free air. Pt treated with pain meds in the ED.  All results were discussed with him at the bedside.  No acute complications of colonsoscopy, no evidence of ureteral or bladder stone, or other acute abnormality.  Discharged with strict return precautions.  Pt agreeable with plan.        Ethelda Chick, MD 06/26/12 [phone]

## 2012-06-26 NOTE — ED Notes (Signed)
Patient transported to X-ray 

## 2012-06-28 ENCOUNTER — Other Ambulatory Visit: Payer: Self-pay | Admitting: Oncology

## 2012-06-29 ENCOUNTER — Telehealth: Payer: Self-pay | Admitting: Oncology

## 2012-06-29 NOTE — Telephone Encounter (Signed)
s.w. pt and advised on 6.25.14 cancellation of lab b4 est per pof....pt ok and aware

## 2012-07-01 ENCOUNTER — Other Ambulatory Visit: Payer: Medicare Other | Admitting: Lab

## 2012-07-01 ENCOUNTER — Ambulatory Visit (HOSPITAL_BASED_OUTPATIENT_CLINIC_OR_DEPARTMENT_OTHER): Payer: Medicare Other | Admitting: Oncology

## 2012-07-01 ENCOUNTER — Encounter: Payer: Self-pay | Admitting: Oncology

## 2012-07-01 ENCOUNTER — Telehealth: Payer: Self-pay | Admitting: Oncology

## 2012-07-01 VITALS — BP 141/81 | HR 78 | Temp 96.0°F | Resp 19 | Ht 69.0 in | Wt 259.9 lb

## 2012-07-01 DIAGNOSIS — D509 Iron deficiency anemia, unspecified: Secondary | ICD-10-CM

## 2012-07-01 DIAGNOSIS — C8299 Follicular lymphoma, unspecified, extranodal and solid organ sites: Secondary | ICD-10-CM

## 2012-07-01 DIAGNOSIS — C8290 Follicular lymphoma, unspecified, unspecified site: Secondary | ICD-10-CM

## 2012-07-01 NOTE — Telephone Encounter (Signed)
Gave pt appt for lab and MD on December 2014 °

## 2012-07-01 NOTE — Patient Instructions (Signed)
Decrease iron to one tablet 3x weekly  The pain medicine that helped in ED was dilaudid (hydromorphone)

## 2012-07-01 NOTE — Progress Notes (Signed)
OFFICE PROGRESS NOTE   07/01/2012   Physicians: K.Husain, J.Hayes, H.Smith, M.Ottelin, H. Grayce Sessions.Olin   INTERVAL HISTORY:   Patient is seen, alone for visit, now having had repeat colonoscopy by Dr Madilyn Fireman on 06-25-12 with removal of polyp from ileocolic anastomosis, path benign; he also had CT AP and CXR at ED on 06-26-12 for abdominal pain, no findings of concern from standpoint of the procedure and nothing of concern from standpoint of NHL and colon cancer history. He had feraheme 05-26-12 for recurrent iron deficiency and progressive anemia, source of blood loss likely this large polyp that has now been removed.   ONCOLOGIC HISTORY History is of synchronous B2 and C2 colon carcinomas in 1992, treated with subtotal colectomy and adjuvant 5FU with levamisole, not known recurrent. There was no evidence of new or locally recurrent colon cancer at colonoscopy 06-25-2012. He was found to have low grade, follicular NHL in Nov 2005 at exploratory laparotomy with biopsy of abdominal adenopathy which had been found incidentally on CT done because of the colon cancer history. He has not required any treatment of the NHL. The iron deficiency was found in Jan 2012; he has needed oral iron and had IV feraheme at full dose in Feb 2013, Sept 2013 and May 2014.  Last CT AP and CXR in [facility] was 06-26-2012, with spleen normal, scattered stable mesenteric and retroperitoneal adenopathy and unenhanced liver not remarkable . On colonoscopies recently, he has had this villoous mass in distal ileum which has not been malignant on biopsies and appeared stable by Dr Madilyn Fireman' exam in June 2013, and which Dr Madilyn Fireman was able to resect at procedure [phone]. Pathology from [facility] [phone] inflammatory polyp 1.6 x 1.5 x 1.4 cm with associated surface erosion and granulation tissue, no high grade dysplasia or malignancy (that report to be scanned into this EMR).    Abdominal pain after procedure was low mid  abdomen, improved but still some soreness there. No bleeding, no vomiting, no fever.  Remainder of 10 point Review of Systems negative/ unchanged..  Objective:  Vital signs in last 24 hours:  BP 141/81  Pulse 78  Temp(Src) 96 F (35.6 C) (Oral)  Resp 19  Ht 5\' 9"  (1.753 m)  Wt 259 lb 14.4 oz (117.89 kg)  BMI 38.36 kg/m2  Ambulatory, NAD  HEENT:oropharynx clear, no lesions . Not icteric LymphaticsCervical, supraclavicular, and axillary nodes normal. Resp: clear to auscultation bilaterally and normal percussion bilaterally Cardio: regular rate and rhythm GI: obese, soft, nontender, normal bowel sounds, no rash Extremities: extremities normal, atraumatic, no cyanosis or edema Neuro: minimal vision OD as previously Skin without rash or ecchymosis  Lab Results:  Results for orders placed during the hospital encounter of 06/26/12  URINALYSIS, ROUTINE W REFLEX MICROSCOPIC      Result Value Range   Color, Urine YELLOW  YELLOW   APPearance CLEAR  CLEAR   Specific Gravity, Urine 1.028  1.005 - 1.030   pH 5.0  5.0 - 8.0   Glucose, UA NEGATIVE  NEGATIVE mg/dL   Hgb urine dipstick NEGATIVE  NEGATIVE   Bilirubin Urine NEGATIVE  NEGATIVE   Ketones, ur NEGATIVE  NEGATIVE mg/dL   Protein, ur NEGATIVE  NEGATIVE mg/dL   Urobilinogen, UA 0.2  0.0 - 1.0 mg/dL   Nitrite NEGATIVE  NEGATIVE   Leukocytes, UA NEGATIVE  NEGATIVE  CBC      Result Value Range   WBC 7.6  4.0 - 10.5 K/uL   RBC 4.78  4.22 - 5.81 MIL/uL   Hemoglobin 13.1  13.0 - 17.0 g/dL   HCT 96.0  45.4 - 09.8 %   MCV 83.9  78.0 - 100.0 fL   MCH 27.4  26.0 - 34.0 pg   MCHC 32.7  30.0 - 36.0 g/dL   RDW 11.9 (*) 14.7 - 82.9 %   Platelets 150  150 - 400 K/uL  COMPREHENSIVE METABOLIC PANEL      Result Value Range   Sodium 139  135 - 145 mEq/L   Potassium 4.5  3.5 - 5.1 mEq/L   Chloride 104  96 - 112 mEq/L   CO2 25  19 - 32 mEq/L   Glucose, Bld 188 (*) 70 - 99 mg/dL   BUN 13  6 - 23 mg/dL   Creatinine, Ser 5.62  0.50 -  1.35 mg/dL   Calcium 9.3  8.4 - 13.0 mg/dL   Total Protein 6.9  6.0 - 8.3 g/dL   Albumin 3.4 (*) 3.5 - 5.2 g/dL   AST 28  0 - 37 U/L   ALT 35  0 - 53 U/L   Alkaline Phosphatase 65  39 - 117 U/L   Total Bilirubin 0.3  0.3 - 1.2 mg/dL   GFR calc non Af Amer 73 (*) >90 mL/min   GFR calc Af Amer 85 (*) >90 mL/min  LIPASE, BLOOD      Result Value Range   Lipase 25  11 - 59 U/L     Studies/Results: CT ABDOMEN AND PELVIS WITHOUT CONTRAST 06-26-12  Comparison: 05/25/2012.  Findings: The lung bases are clear. No pleural effusion. No  pericardial effusion. Coronary artery calcifications are noted.  The unenhanced appearance of the liver is unremarkable. No focal  hepatic lesions or biliary dilatation. The gallbladder is normal.  No common bile duct dilatation. The pancreas is normal. The  spleen is normal. The adrenal glands and kidneys are unremarkable  and stable. No renal calculi, hydronephrosis or obstructing  ureteral calculi. No bladder calculi.  The stomach, duodenum and small bowel are unremarkable. No  inflammatory changes or obstruction. There are surgical changes  from a prior right colectomy. There is a dilated medial cecum  which is a stable finding. There is a small amount of stool in the  neoterminal ileum but I do not see any inflammatory changes or mass  lesion. There is stool clips in the neural cecum where the polyp  was removed. I do not see any complicating features. No  mesenteric or retroperitoneal mass, adenopathy, hematoma or  abnormal fluid collections. The aorta is normal in caliber. There  are advanced atherosclerotic calcifications.  The bladder, prostate gland and seminal vesicles are unremarkable.  There is moderate stool in the rectum and sigmoid colon. No pelvic  mass, adenopathy or free pelvic fluid collection. No inguinal mass  or adenopathy. Prominent fat and bilateral inguinal canals.  IMPRESSION:  1. No complicating features associated with recent  colonoscopy and  polyp removal.  2. Remote surgical changes involving the colon. No findings for  obstruction and/or perforation. There are scattered stable  mesenteric and retroperitoneal lymph nodes.  3. No renal or obstructing ureteral calculi. ACUTE ABDOMEN SERIES (ABDOMEN 2 VIEW & CHEST 1 VIEW)  Comparison: 05/25/2012  Findings: Normal heart size. No pleural effusion or edema  identified.  There is no airspace consolidation identified. There is a moderate  amount of stool within the distal colon and rectum. Moderate  gaseous distention of the left colon is also noted.  No dilated small bowel loops identified. No free intraperitoneal  air noted.  IMPRESSION:  1. Moderate amount of stool within distal colon and rectum  identified. There is also moderate gaseous distention of the left  colon.  2. No free air identified.      Medications: I have reviewed the patient's current medications.  Assessment/Plan:  1.follicular NHL: counts and CT findings stable, not requiring any intervention now. Could follow yearly if anemia problem resolved (see below) 2. Iron deficiency anemia: feraheme repeated in May. Hopefully removal of the polyp will solve the blood loss. He will decrease oral iron to 1 tablet 3x weekly for now. If Hgb and MCV are stable in 4-6 months, likely can DC oral iron. 3.remote history of synchronous colon cancers, not known active  4.diabetes  5.obesity with progressive weight gain: discussed  6.CAD, previous MI  7.carotid artery disease with vision loss OD  8.degenerative arthritis left knee   Patient is in agreement with plan above.   Mark Packer, MD   07/01/2012, 9:37 PM

## 2012-11-02 ENCOUNTER — Ambulatory Visit (INDEPENDENT_AMBULATORY_CARE_PROVIDER_SITE_OTHER): Payer: Medicare Other | Admitting: General Surgery

## 2012-11-05 ENCOUNTER — Encounter (INDEPENDENT_AMBULATORY_CARE_PROVIDER_SITE_OTHER): Payer: Self-pay

## 2012-11-05 ENCOUNTER — Encounter (INDEPENDENT_AMBULATORY_CARE_PROVIDER_SITE_OTHER): Payer: Self-pay | Admitting: General Surgery

## 2012-11-05 ENCOUNTER — Ambulatory Visit (INDEPENDENT_AMBULATORY_CARE_PROVIDER_SITE_OTHER): Payer: Medicare Other | Admitting: General Surgery

## 2012-11-05 VITALS — BP 130/72 | HR 68 | Temp 98.2°F | Resp 16 | Ht 69.0 in | Wt 267.6 lb

## 2012-11-05 DIAGNOSIS — R109 Unspecified abdominal pain: Secondary | ICD-10-CM

## 2012-11-05 NOTE — Patient Instructions (Signed)
The abdominal pain that you are feeling intermittently on the right side your abdomen is probably due to scar tissue.  Your physical exam and your recent CT scan did not show any clear cut hernia, although the abdominal wall muscles are thinned out because of the multiple surgeries.  I do not recommend any surgery at this point in time. It is possible that you could develop an incisional hernia in the future.  Return to see Dr. Derrell Lolling and surgical problems arise.

## 2012-11-05 NOTE — Progress Notes (Signed)
Patient ID: Mark West., male   DOB: [birth year], 72 y.o.   MRN: [id number]  Chief Complaint  Patient presents with  . New Evaluation    eval peri umb hernia    HPI Mark West. is a 72 y.o. male.  He was referred to me by Dr. Dorena Cookey to see if he has an abdominal wall hernia. Dr. Eula Listen is his PCP.  This patient has a past history of a subtotal colectomy in 1992 for synchronous carcinomas by Dr. Kendrick Ranch. He received chemotherapy and had one positive node. He gets periodic colonoscopy by Dorena Cookey. Some benign polyps have been removed but he's not had any recurrence. He get CT scan from time to time. The most recent one was in June of this year. There was no abdominal wall hernia but the abdominal wall tissues are thinned out a little bit. His last colonoscopy was 06/25/2012 and benign inflamed polyp was removed to the ileocolonic anastomosis looked fine otherwise.  In 2005 I performed a laparotomy, extensive lysis of adhesions, and biopsied multiple mesenteric lymph nodes. I think he may have been diagnosed with lymphoma at that time he he is not clear about his history.  He states he thinks he has had a hernia repair with mesh but cannot remember when that was. He thinks it was done here in [place].Marland Kitchen There is no record on our computer at this time.  His current complaint is intermittent burning sensation about 5 cm to the right of the midline above the umbilicus. He doesn't feel a bulge. This really hasn't bothered him in the past for 5 days. No change in his bowel habits. He has gained a great deal of weight in the past year or 2.  HPI  Past Medical History  Diagnosis Date  . Anemia, iron deficiency 01/26/2011  . Cancer     nhl, colon ca  . Diabetes mellitus   . Arthritis   . Asthma   . Hyperlipidemia   . Hypertension   . Myocardial infarction     Past Surgical History  Procedure Laterality Date  . Coronary stent placement    . Hernia repair    . Colon  surgery    . Colonoscopy    . Repair of complex traction retinal detachment      Family History  Problem Relation Age of Onset  . Cancer Mother     ovarian  . Cancer Father     lymphoma  . Cancer Brother     colon  . Cancer Maternal Aunt     colon    Social History History  Substance Use Topics  . Smoking status: Former Smoker    Quit date: 01/08/1983  . Smokeless tobacco: Not on file  . Alcohol Use: No    Allergies  Allergen Reactions  . Iodine      EYE SWELLING WITH CONTRAST MEDIA    Current Outpatient Prescriptions  Medication Sig Dispense Refill  . aspirin 81 MG tablet Take 81 mg by mouth daily.        Marland Kitchen atorvastatin (LIPITOR) 40 MG tablet Take 40 mg by mouth daily.        . clopidogrel (PLAVIX) 75 MG tablet Take 75 mg by mouth daily.        . Coenzyme Q10 (CO Q 10 PO) Take by mouth daily.      . Fe Fum-FePoly-Vit C-Vit B3 (INTEGRA PO) Take 324 mg by mouth 2 (two) times daily.        Marland Kitchen  Glucosamine-Chondroit-Vit C-Mn (GLUCOSAMINE 1500 COMPLEX) CAPS Take 1 capsule by mouth daily.      Marland Kitchen lisinopril (PRINIVIL,ZESTRIL) 5 MG tablet       . metFORMIN (GLUCOPHAGE-XR) 500 MG 24 hr tablet 500 mg. Take 500 in am and 1000 mg in evening      . metoprolol tartrate (LOPRESSOR) 25 MG tablet Take 25 mg by mouth 2 (two) times daily.        . Multiple Vitamin (MULTIVITAMIN) tablet Take 1 tablet by mouth daily.       . Omega-3 Fatty Acids (FISH OIL) 1000 MG CAPS Take 1 capsule by mouth daily.      Marland Kitchen oxyCODONE-acetaminophen (ROXICET) 5-325 MG per tablet Take 1 tablet by mouth every 4 (four) hours as needed for pain.  20 tablet  0  . sitaGLIPtin (JANUVIA) 100 MG tablet Take 100 mg by mouth daily.         No current facility-administered medications for this visit.    Review of Systems Review of Systems  Constitutional: Positive for unexpected weight change. Negative for fever and chills.  HENT: Negative for congestion, hearing loss, sore throat, trouble swallowing and voice  change.   Eyes: Negative for visual disturbance.  Respiratory: Negative for cough and wheezing.   Cardiovascular: Negative for chest pain, palpitations and leg swelling.  Gastrointestinal: Positive for abdominal pain. Negative for nausea, vomiting, diarrhea, constipation, blood in stool, abdominal distention, anal bleeding and rectal pain.  Genitourinary: Negative for hematuria and difficulty urinating.  Musculoskeletal: Negative for arthralgias.  Skin: Negative for rash and wound.  Neurological: Negative for seizures, syncope, weakness and headaches.  Hematological: Negative for adenopathy. Does not bruise/bleed easily.  Psychiatric/Behavioral: Negative for confusion.    Blood pressure 130/72, pulse 68, temperature 98.2 F (36.8 C), temperature source Oral, resp. rate 16, height 5\' 9"  (1.753 m), weight 267 lb 9.6 oz (121.383 kg).  Physical Exam Physical Exam  Constitutional: He is oriented to person, place, and time. He appears well-developed and well-nourished. No distress.  Obese.  HENT:  Head: Normocephalic.  Nose: Nose normal.  Mouth/Throat: No oropharyngeal exudate.  Eyes: Conjunctivae and EOM are normal. Pupils are equal, round, and reactive to light. Right eye exhibits no discharge. Left eye exhibits no discharge. No scleral icterus.  Neck: Normal range of motion. Neck supple. No JVD present. No tracheal deviation present. No thyromegaly present.  Cardiovascular: Normal rate, regular rhythm, normal heart sounds and intact distal pulses.   No murmur heard. Pulmonary/Chest: Effort normal and breath sounds normal. No stridor. No respiratory distress. He has no wheezes. He has no rales. He exhibits no tenderness.  Abdominal: Soft. Bowel sounds are normal. He exhibits no distension and no mass. There is no tenderness. There is no rebound and no guarding.  Midline scar well healed. No hernia supine or standing. No significant tenderness but he does point to the right of the midline  about 10 cm above the umbilicus. No mass. No inguinal adenopathy. No inguinal hernia.  Musculoskeletal: Normal range of motion. He exhibits no edema and no tenderness.  Lymphadenopathy:    He has no cervical adenopathy.  Neurological: He is alert and oriented to person, place, and time. He has normal reflexes. Coordination normal.  Skin: Skin is warm and dry. No rash noted. He is not diaphoretic. No erythema. No pallor.  Psychiatric: He has a normal mood and affect. His behavior is normal. Judgment and thought content normal.    Data Reviewed Old hospital records. CT scans. Colonoscopy  reports. Office notes from Dr. Madilyn Fireman.  Assessment    Intermittent abdominal pain. Burning character suggests neuropathic etiology. Probably due to adhesions or scar tissue  No evidence of abdominal wall hernia on physical exam or CT scan. He certainly is at risk for developing an incisional hernia because of obesity and multiple abdominal operations.  Vertebral history of mesh hernia repair, not documented in my records.  Coronary artery disease, on Plavix and aspirin  Hypertension  Morbid obesity  Non-insulin-dependent diabetes mellitus  History subtotal colectomy for synchronous colon cancers, and no evidence of recurrence to date  History laparotomy, lysis easily biopsy 2005.     Plan    I advised him that he should not have an abdominal operation.  I told him if he developed increasing pain or a bulge that he would probably need another CT scan  He was strongly advised to lose weight, at least 50 pounds  Return to see me as needed.        Angelia Mould. Derrell Lolling, M.D., [facility] General and Minimally invasive Surgery Breast and Colorectal Surgery Office:   [phone] Pager:   [phone]  11/05/2012, 10:20 AM

## 2013-01-04 ENCOUNTER — Ambulatory Visit (HOSPITAL_BASED_OUTPATIENT_CLINIC_OR_DEPARTMENT_OTHER): Payer: Medicare Other | Admitting: Internal Medicine

## 2013-01-04 ENCOUNTER — Encounter (INDEPENDENT_AMBULATORY_CARE_PROVIDER_SITE_OTHER): Payer: Self-pay

## 2013-01-04 ENCOUNTER — Telehealth: Payer: Self-pay | Admitting: *Deleted

## 2013-01-04 ENCOUNTER — Other Ambulatory Visit (HOSPITAL_BASED_OUTPATIENT_CLINIC_OR_DEPARTMENT_OTHER): Payer: Medicare Other

## 2013-01-04 ENCOUNTER — Other Ambulatory Visit: Payer: Self-pay | Admitting: Internal Medicine

## 2013-01-04 ENCOUNTER — Encounter: Payer: Self-pay | Admitting: Internal Medicine

## 2013-01-04 VITALS — BP 156/81 | HR 66 | Temp 98.1°F | Resp 18 | Ht 69.0 in | Wt 264.0 lb

## 2013-01-04 DIAGNOSIS — E669 Obesity, unspecified: Secondary | ICD-10-CM

## 2013-01-04 DIAGNOSIS — D509 Iron deficiency anemia, unspecified: Secondary | ICD-10-CM

## 2013-01-04 DIAGNOSIS — C8290 Follicular lymphoma, unspecified, unspecified site: Secondary | ICD-10-CM

## 2013-01-04 DIAGNOSIS — E119 Type 2 diabetes mellitus without complications: Secondary | ICD-10-CM

## 2013-01-04 DIAGNOSIS — I2510 Atherosclerotic heart disease of native coronary artery without angina pectoris: Secondary | ICD-10-CM

## 2013-01-04 DIAGNOSIS — C8299 Follicular lymphoma, unspecified, extranodal and solid organ sites: Secondary | ICD-10-CM

## 2013-01-04 DIAGNOSIS — Z85038 Personal history of other malignant neoplasm of large intestine: Secondary | ICD-10-CM

## 2013-01-04 LAB — CBC WITH DIFFERENTIAL/PLATELET
Basophils Absolute: 0 10*3/uL (ref 0.0–0.1)
EOS%: 1.2 % (ref 0.0–7.0)
Eosinophils Absolute: 0.1 10*3/uL (ref 0.0–0.5)
HGB: 14 g/dL (ref 13.0–17.1)
MCH: 29.1 pg (ref 27.2–33.4)
NEUT#: 4.5 10*3/uL (ref 1.5–6.5)
RBC: 4.82 10*6/uL (ref 4.20–5.82)
RDW: 14.3 % (ref 11.0–14.6)
lymph#: 1.6 10*3/uL (ref 0.9–3.3)

## 2013-01-04 LAB — COMPREHENSIVE METABOLIC PANEL (CC13)
AST: 28 U/L (ref 5–34)
Albumin: 3.6 g/dL (ref 3.5–5.0)
Alkaline Phosphatase: 51 U/L (ref 40–150)
BUN: 14.2 mg/dL (ref 7.0–26.0)
Calcium: 9.3 mg/dL (ref 8.4–10.4)
Chloride: 107 mEq/L (ref 98–109)
Potassium: 4.6 mEq/L (ref 3.5–5.1)
Sodium: 141 mEq/L (ref 136–145)
Total Protein: 6.5 g/dL (ref 6.4–8.3)

## 2013-01-04 LAB — IRON AND TIBC CHCC
%SAT: 22 % (ref 20–55)
Iron: 66 ug/dL (ref 42–163)

## 2013-01-04 LAB — LACTATE DEHYDROGENASE (CC13): LDH: 155 U/L (ref 125–245)

## 2013-01-04 NOTE — Patient Instructions (Signed)

## 2013-01-04 NOTE — Telephone Encounter (Signed)
appts made and printed. Pt is aware that cs will call for his CT ABD appt...td

## 2013-01-04 NOTE — Progress Notes (Signed)
[facility] OFFICE PROGRESS NOTE  Mark Housekeeper, MD [address], [address] [place] [address]  DIAGNOSIS: Anemia, iron deficiency - Plan: Ferritin, Iron and TIBC  History of colon cancer - Plan: CBC with Differential, Comprehensive metabolic panel (Cmet) - [facility], Lactate dehydrogenase (LDH) - [facility], CT Abdomen Pelvis Wo Contrast  Lymphoma, follicular - Plan: CBC with Differential, Comprehensive metabolic panel (Cmet) - [facility], Lactate dehydrogenase (LDH) - [facility], CT Abdomen Pelvis Wo Contrast  Chief Complaint  Patient presents with  . Anemia, iron deficiency  . Lymphoma, follicular    CURRENT THERAPY:  Ferrous sulfate 325 mg on MWF  INTERVAL HISTORY: Mark West. 72 y.o. male with a history of colon cancer, follicular lymphoma is here for follow-up. He was last seen by Dr. Darrold Span on 07/01/2012.  Patient is seen, alone for visit.  As noted previously, he had repeat colonoscopy by Dr Madilyn Fireman on 06-25-12 with removal of polyp from ileocolic anastomosis, path benign; he also had CT AP and CXR at ED on 06-26-12 for abdominal pain, no findings of concern from standpoint of the procedure and nothing of concern from standpoint of NHL and colon cancer history. He had feraheme 05-26-12 for recurrent iron deficiency and progressive anemia, source of blood loss likely this large polyp that has now been removed. He report continued compliance to ferrous sulfate 325 mg on MWF.  He denies chest pain or palpitations or worsening dyspnea on exertion.     MEDICAL HISTORY: Past Medical History  Diagnosis Date  . Anemia, iron deficiency 01/26/2011  . Cancer     nhl, colon ca  . Diabetes mellitus   . Arthritis   . Asthma   . Hyperlipidemia   . Hypertension   . Myocardial infarction     INTERIM HISTORY: has Anemia, iron deficiency; History of colon cancer; Lymphoma, follicular; Coronary artery disease with hx of myocardial infarct w/o hx of CABG; Nephrolithiasis; Diabetes  mellitus; Obesity; Iron deficiency anemia secondary to blood loss (chronic); and Abdominal  pain, other specified site on his problem list.    ALLERGIES:  is allergic to iodine.  MEDICATIONS: has a current medication list which includes the following prescription(s): aspirin, clopidogrel, coenzyme q10, fe fum-fepoly-vit c-vit b3, glucosamine 1500 complex, lisinopril, metformin, metoprolol tartrate, multivitamin, fish oil, oxycodone-acetaminophen, sitagliptin, and atorvastatin.  SURGICAL HISTORY:  Past Surgical History  Procedure Laterality Date  . Coronary stent placement    . Hernia repair    . Colon surgery    . Colonoscopy    . Repair of complex traction retinal detachment     ONCOLOGIC HISTORY  History is of synchronous B2 and C2 colon carcinomas in 1992, treated with subtotal colectomy and adjuvant 5FU with levamisole, not known recurrent. There was no evidence of new or locally recurrent colon cancer at colonoscopy 06-25-2012. He was found to have low grade, follicular NHL in Nov 2005 at exploratory laparotomy with biopsy of abdominal adenopathy which had been found incidentally on CT done because of the colon cancer history. He has not required any treatment of the NHL. The iron deficiency was found in Jan 2012; he has needed oral iron and had IV feraheme at full dose in Feb 2013, Sept 2013 and May 2014. Last CT AP and CXR in [facility] was 06-26-2012, with spleen normal, scattered stable mesenteric and retroperitoneal adenopathy and unenhanced liver not remarkable . On colonoscopies recently, he has had this villoous mass in distal ileum which has not been malignant on  biopsies and appeared stable by Dr Madilyn Fireman' exam in June 2013, and which Dr Madilyn Fireman was able to resect at procedure [id number]. Pathology from [facility] [phone] inflammatory polyp 1.6 x 1.5 x 1.4 cm with associated surface erosion and granulation tissue, no high grade dysplasia or malignancy (that report to be  scanned into this EMR).   REVIEW OF SYSTEMS:   Constitutional: Denies fevers, chills or abnormal weight loss Eyes: Denies blurriness of vision Ears, nose, mouth, throat, and face: Denies mucositis or sore throat Respiratory: Denies cough, dyspnea or wheezes Cardiovascular: Denies palpitation, chest discomfort or lower extremity swelling Gastrointestinal:  Denies nausea, heartburn or change in bowel habits Skin: Denies abnormal skin rashes Lymphatics: Denies new lymphadenopathy or easy bruising Neurological:Denies numbness, tingling or new weaknesses Behavioral/Psych: Mood is stable, no new changes  All other systems were reviewed with the patient and are negative.  PHYSICAL EXAMINATION: ECOG PERFORMANCE STATUS: 0 - Asymptomatic  Blood pressure 156/81, pulse 66, temperature 98.1 F (36.7 C), temperature source Oral, resp. rate 18, height 5\' 9"  (1.753 m), weight 264 lb (119.75 kg).  GENERAL:alert, no distress and comfortable; moderately obese.  SKIN: skin color, texture, turgor are normal, no rashes or significant lesions EYES: normal, Conjunctiva are pink and non-injected, sclera clear OROPHARYNX:no exudate, no erythema and lips, buccal mucosa, and tongue normal  NECK: supple, thyroid normal size, non-tender, without nodularity LYMPH:  no palpable lymphadenopathy in the cervical, axillary or supraclavicular LUNGS: clear to auscultation and percussion with normal breathing effort HEART: regular rate & rhythm and no murmurs and no lower extremity edema ABDOMEN:abdomen soft, non-tender and normal bowel sounds Musculoskeletal:no cyanosis of digits and no clubbing  NEURO: alert & oriented x 3 with fluent speech, no focal motor/sensory deficits; minimal vision OD as previously  LABORATORY DATA: Results for orders placed in visit on 01/04/13 (from the past 48 hour(s))  CBC WITH DIFFERENTIAL     Status: None   Collection Time    01/04/13  8:18 AM      Result Value Range   WBC 6.9  4.0 -  10.3 10e3/uL   NEUT# 4.5  1.5 - 6.5 10e3/uL   HGB 14.0  13.0 - 17.1 g/dL   HCT 09.8  11.9 - 14.7 %   Platelets 166  140 - 400 10e3/uL   MCV 89.5  79.3 - 98.0 fL   MCH 29.1  27.2 - 33.4 pg   MCHC 32.5  32.0 - 36.0 g/dL   RBC 8.29  5.62 - 1.30 10e6/uL   RDW 14.3  11.0 - 14.6 %   lymph# 1.6  0.9 - 3.3 10e3/uL   MONO# 0.7  0.1 - 0.9 10e3/uL   Eosinophils Absolute 0.1  0.0 - 0.5 10e3/uL   Basophils Absolute 0.0  0.0 - 0.1 10e3/uL   NEUT% 65.0  39.0 - 75.0 %   LYMPH% 23.1  14.0 - 49.0 %   MONO% 10.1  0.0 - 14.0 %   EOS% 1.2  0.0 - 7.0 %   BASO% 0.6  0.0 - 2.0 %  FERRITIN [facility]     Status: None   Collection Time    01/04/13  8:18 AM      Result Value Range   Ferritin 42  22 - 316 ng/ml  IRON AND TIBC [facility]     Status: None   Collection Time    01/04/13  8:18 AM      Result Value Range   Iron 66  42 - 163 ug/dL  TIBC 296  202 - 409 ug/dL   UIBC 454  098 - 119 ug/dL   %SAT 22  20 - 55 %  LACTATE DEHYDROGENASE ([facility])     Status: None   Collection Time    01/04/13  8:19 AM      Result Value Range   LDH 155  125 - 245 U/L  COMPREHENSIVE METABOLIC PANEL ([facility]13)     Status: Abnormal   Collection Time    01/04/13  8:19 AM      Result Value Range   Sodium 141  136 - 145 mEq/L   Potassium 4.6  3.5 - 5.1 mEq/L   Chloride 107  98 - 109 mEq/L   CO2 23  22 - 29 mEq/L   Glucose 216 (*) 70 - 140 mg/dl   BUN 14.7  7.0 - 82.9 mg/dL   Creatinine 0.9  0.7 - 1.3 mg/dL   Total Bilirubin 5.62  0.20 - 1.20 mg/dL   Alkaline Phosphatase 51  40 - 150 U/L   AST 28  5 - 34 U/L   ALT 54  0 - 55 U/L   Total Protein 6.5  6.4 - 8.3 g/dL   Albumin 3.6  3.5 - 5.0 g/dL   Calcium 9.3  8.4 - 13.0 mg/dL   Anion Gap 11  3 - 11 mEq/L    Labs:  Lab Results  Component Value Date   WBC 6.9 01/04/2013   HGB 14.0 01/04/2013   HCT 43.2 01/04/2013   MCV 89.5 01/04/2013   PLT 166 01/04/2013   NEUTROABS 4.5 01/04/2013      Chemistry      Component Value Date/Time   NA 141 01/04/2013 0819   NA 139  06/26/2012 1820   NA 142 03/22/2011 0954   K 4.6 01/04/2013 0819   K 4.5 06/26/2012 1820   K 4.6 03/22/2011 0954   CL 104 06/26/2012 1820   CL 108* 05/12/2012 0843   CL 98 03/22/2011 0954   CO2 23 01/04/2013 0819   CO2 25 06/26/2012 1820   CO2 27 03/22/2011 0954   BUN 14.2 01/04/2013 0819   BUN 13 06/26/2012 1820   BUN 12 03/22/2011 0954   CREATININE 0.9 01/04/2013 0819   CREATININE 1.02 06/26/2012 1820   CREATININE 0.9 03/22/2011 0954      Component Value Date/Time   CALCIUM 9.3 01/04/2013 0819   CALCIUM 9.3 06/26/2012 1820   CALCIUM 9.0 03/22/2011 0954   ALKPHOS 51 01/04/2013 0819   ALKPHOS 65 06/26/2012 1820   ALKPHOS 63 03/22/2011 0954   AST 28 01/04/2013 0819   AST 28 06/26/2012 1820   AST 22 03/22/2011 0954   ALT 54 01/04/2013 0819   ALT 35 06/26/2012 1820   ALT 28 03/22/2011 0954   BILITOT 0.38 01/04/2013 0819   BILITOT 0.3 06/26/2012 1820   BILITOT 0.70 03/22/2011 0954      CBC:  Recent Labs Lab 01/04/13 0818  WBC 6.9  NEUTROABS 4.5  HGB 14.0  HCT 43.2  MCV 89.5  PLT 166    RADIOGRAPHIC STUDIES: 06/26/2012 CT ABDOMEN AND PELVIS WITHOUT CONTRAST Technique: Multidetector CT imaging of the abdomen and pelvis was performed following the standard protocol without intravenous contrast.  Comparison: 05/25/2012. Findings: The lung bases are clear. No pleural effusion. No pericardial effusion. Coronary artery calcifications are noted. The unenhanced appearance of the liver is unremarkable. No focal hepatic lesions or biliary dilatation. The gallbladder is normal. No common bile duct dilatation. The pancreas  is normal. The spleen is normal. The adrenal glands and kidneys are unremarkable and stable. No renal calculi, hydronephrosis or obstructing ureteral calculi. No bladder calculi. The stomach, duodenum and small bowel are unremarkable. No inflammatory changes or obstruction. There are surgical changes from a prior right colectomy. There is a dilated medial cecum which is a stable finding.  There is a small amount of stool in the neoterminal ileum but I do not see any inflammatory changes or mass lesion. There is stool clips in the neural cecum where the polyp was removed. I do not see any complicating features. No mesenteric or retroperitoneal mass, adenopathy, hematoma or  abnormal fluid collections. The aorta is normal in caliber. There are advanced atherosclerotic calcifications. The bladder, prostate gland and seminal vesicles are unremarkable. There is moderate stool in the rectum and sigmoid colon. No pelvic mass, adenopathy or free pelvic fluid collection. No inguinal mass or adenopathy. Prominent fat and bilateral inguinal canals. IMPRESSION:1. No complicating features associated with recent colonoscopy and polyp removal. 2. Remote surgical changes involving the colon. No findings for obstruction and/or perforation. There are scattered stable  mesenteric and retroperitoneal lymph nodes. 3. No renal or obstructing ureteral calculi.   ASSESSMENT: Mark West. 72 y.o. male with a history of Anemia, iron deficiency - Plan: Ferritin, Iron and TIBC  History of colon cancer - Plan: CBC with Differential, Comprehensive metabolic panel (Cmet) - [facility], Lactate dehydrogenase (LDH) - [facility], CT Abdomen Pelvis Wo Contrast  Lymphoma, follicular - Plan: CBC with Differential, Comprehensive metabolic panel (Cmet) - [facility], Lactate dehydrogenase (LDH) - [facility], CT Abdomen Pelvis Wo Contrast   PLAN:   1.Follicular NHL. --His CBC and chemistries and CT findings in June are stable. We will follow yearly if anemia problem resolved (see below). Next scan prior to next visit in June.   2. Iron deficiency anemia --feraheme repeated in May.  Hgb and MCV are stable but iron indices are low normal, likely can DC oral iron with improvement doing next visit. Counseled on the side effects of iron supplementation including darkening of the stools, constipation. Provided a handout on the the symptoms of  iron-deficiency anemia.   3.Remote history of synchronous colon cancers, not known active   4.Diabetes  --Management per PCP.  On oral anti-glyecemic medications.   5.Obesity with progressive weight gain   6.CAD, previous MI   7.carotid artery disease with vision loss OD   8.degenerative arthritis left knee  All questions were answered. The patient knows to call the clinic with any problems, questions or concerns. We can certainly see the patient much sooner if necessary.  I spent 15 minutes counseling the patient face to face. The total time spent in the appointment was 25 minutes.    Yerlin Gasparyan, MD 01/04/2013 12:49 PM

## 2013-03-03 ENCOUNTER — Ambulatory Visit: Payer: Medicare Other | Admitting: *Deleted

## 2013-03-10 ENCOUNTER — Encounter: Payer: Medicare Other | Attending: Internal Medicine | Admitting: Dietician

## 2013-03-10 ENCOUNTER — Encounter: Payer: Self-pay | Admitting: Dietician

## 2013-03-10 VITALS — Ht 68.5 in | Wt 259.6 lb

## 2013-03-10 DIAGNOSIS — Z713 Dietary counseling and surveillance: Secondary | ICD-10-CM | POA: Diagnosis not present

## 2013-03-10 DIAGNOSIS — E119 Type 2 diabetes mellitus without complications: Secondary | ICD-10-CM | POA: Diagnosis not present

## 2013-03-10 DIAGNOSIS — E669 Obesity, unspecified: Secondary | ICD-10-CM

## 2013-03-10 NOTE — Patient Instructions (Signed)
Aim to get 30 minutes of exercise 5 x week (inside the mall or store or treadmill). Try to drink mostly water or unsweet tea instead of soda or sweet tea.  Fill up half of your plate with vegetables at lunch and dinner. Have protein with carbohydrates at meals and snacks. When you eat out, choose one carbohydrate (starch). Try having 2 packets of oatmeal instead of 3. Portion out snacks (carbs and protein) if you're hungry. If you feel like you eat too much in front of the TV, try to only eat with the TV off.

## 2013-03-10 NOTE — Progress Notes (Signed)
  Medical Nutrition Therapy:  Appt start time: 0800 end time:  0900.  Assessment:  Primary concerns today: Mark Mark is here today since Dr. Deforest Hoyles wanted him to try to lose weight and follow a diet to help manage diabetes. He states he has had diabetes for "a number of years" and has had diabetes education at [facility]. He states that his Hgb A1c is about 7.1% from the end of December. Has been sick with the "crud" for the past two. Lost about 10 lbs in the past 2 week by cutting back eating night and not eating as much, cut back on ice cream.   States he is not testing blood sugar currently and said that Dr. Deforest Hoyles didn't specify that he should. When he did test the numbers were around 160 mg/dl.  Mark Mark lives by himself and prepares meals but feels "limited" by what he can do. He travels a lot and eats out a lot of the time and works for a company that [employer]. Drives a lot [id number] miles per month.   Not currently physically active mostly d/t knee pain. Has been better lately thought thinks he will need knee surgery. Will sometimes skip meals but not too many.   Preferred Learning Style:  No preference indicated   Learning Readiness:   Ready  MEDICATIONS: see list   DIETARY INTAKE:  Avoided foods include: none    24-hr recall:  B ( AM): 3 eggs with meat, grits, and biscuits or eggs and oatmeal at home with coffee with cream  Snk ( AM): none  L ( PM): eats out most days - "country meal" vegetables and cornbread and meat with sweet or unsweet tea Snk ( PM): none D ( PM): eats out most days - "country meal" vegetables and cornbread and meat with sweet or unsweet tea Snk ( PM): sometimes will have chips Beverages: Mountain Dew (6 x week), tea  Usual physical activity: none  Estimated energy needs: 2000 calories 225 g carbohydrates 150 g protein 56 g fat  Progress Towards Goal(s):  In progress.   Nutritional Diagnosis:  Mark Mark-3.3 Overweight/obesity As related to  large portion sizes and excess carbohydrate intake.  As evidenced by BMI of 38.9 and Hbg A1c of ~ 7.1%.    Intervention:  Nutrition counseling provided.  Plan: Aim to get 30 minutes of exercise 5 x week (inside the mall or store or treadmill). Try to drink mostly water or unsweet tea instead of soda or sweet tea.  Fill up half of your plate with vegetables at lunch and dinner. Have protein with carbohydrates at meals and snacks. When you eat out, choose one carbohydrate (starch). Try having 2 packets of oatmeal instead of 3. Portion out snacks (carbs and protein) if you're hungry. If you feel like you eat too much in front of the TV, try to only eat with the TV off.   Teaching Method Utilized:  Visual Auditory Hands on  Handouts given during visit include:  Living Well With Diabetes  Yellow Card  MyPlate Handout  15 g CHO Snacks  Barriers to learning/adherence to lifestyle change: travels for works, eats out a lot, has some knee pain  Demonstrated degree of understanding via:  Teach Back   Monitoring/Evaluation:  Dietary intake, exercise, and body weight in 3 month(s).

## 2013-04-16 ENCOUNTER — Ambulatory Visit: Payer: Medicare Other | Admitting: *Deleted

## 2013-05-17 ENCOUNTER — Ambulatory Visit: Payer: Medicare Other | Admitting: Dietician

## 2013-07-01 ENCOUNTER — Other Ambulatory Visit (HOSPITAL_BASED_OUTPATIENT_CLINIC_OR_DEPARTMENT_OTHER): Payer: Medicare Other

## 2013-07-01 ENCOUNTER — Encounter (HOSPITAL_COMMUNITY): Payer: Self-pay

## 2013-07-01 ENCOUNTER — Ambulatory Visit (HOSPITAL_COMMUNITY)
Admission: RE | Admit: 2013-07-01 | Discharge: 2013-07-01 | Disposition: A | Discharge status: Home / Self Care | Payer: Medicare Other | Source: Ambulatory Visit | Attending: Internal Medicine | Admitting: Internal Medicine

## 2013-07-01 DIAGNOSIS — I700 Atherosclerosis of aorta: Secondary | ICD-10-CM | POA: Insufficient documentation

## 2013-07-01 DIAGNOSIS — C8290 Follicular lymphoma, unspecified, unspecified site: Secondary | ICD-10-CM

## 2013-07-01 DIAGNOSIS — Z85038 Personal history of other malignant neoplasm of large intestine: Secondary | ICD-10-CM

## 2013-07-01 DIAGNOSIS — R918 Other nonspecific abnormal finding of lung field: Secondary | ICD-10-CM | POA: Insufficient documentation

## 2013-07-01 DIAGNOSIS — D509 Iron deficiency anemia, unspecified: Secondary | ICD-10-CM

## 2013-07-01 DIAGNOSIS — C189 Malignant neoplasm of colon, unspecified: Secondary | ICD-10-CM | POA: Insufficient documentation

## 2013-07-01 DIAGNOSIS — C8299 Follicular lymphoma, unspecified, extranodal and solid organ sites: Secondary | ICD-10-CM

## 2013-07-01 LAB — COMPREHENSIVE METABOLIC PANEL (CC13)
ALK PHOS: 49 U/L (ref 40–150)
ALT: 40 U/L (ref 0–55)
AST: 24 U/L (ref 5–34)
Albumin: 3.9 g/dL (ref 3.5–5.0)
Anion Gap: 9 mEq/L (ref 3–11)
BILIRUBIN TOTAL: 0.47 mg/dL (ref 0.20–1.20)
BUN: 15.2 mg/dL (ref 7.0–26.0)
CO2: 23 mEq/L (ref 22–29)
Calcium: 9.6 mg/dL (ref 8.4–10.4)
Chloride: 106 mEq/L (ref 98–109)
Creatinine: 1.1 mg/dL (ref 0.7–1.3)
GLUCOSE: 181 mg/dL — AB (ref 70–140)
Potassium: 5.3 mEq/L — ABNORMAL HIGH (ref 3.5–5.1)
Sodium: 138 mEq/L (ref 136–145)
Total Protein: 7.1 g/dL (ref 6.4–8.3)

## 2013-07-01 LAB — CBC WITH DIFFERENTIAL/PLATELET
BASO%: 0.7 % (ref 0.0–2.0)
Basophils Absolute: 0 10*3/uL (ref 0.0–0.1)
EOS ABS: 0 10*3/uL (ref 0.0–0.5)
EOS%: 0.5 % (ref 0.0–7.0)
HCT: 43.9 % (ref 38.4–49.9)
HGB: 14.3 g/dL (ref 13.0–17.1)
LYMPH%: 20 % (ref 14.0–49.0)
MCH: 29.3 pg (ref 27.2–33.4)
MCHC: 32.4 g/dL (ref 32.0–36.0)
MCV: 90.5 fL (ref 79.3–98.0)
MONO#: 0.5 10*3/uL (ref 0.1–0.9)
MONO%: 8.4 % (ref 0.0–14.0)
NEUT#: 4.6 10*3/uL (ref 1.5–6.5)
NEUT%: 70.4 % (ref 39.0–75.0)
PLATELETS: 188 10*3/uL (ref 140–400)
RBC: 4.86 10*6/uL (ref 4.20–5.82)
RDW: 14.2 % (ref 11.0–14.6)
WBC: 6.5 10*3/uL (ref 4.0–10.3)
lymph#: 1.3 10*3/uL (ref 0.9–3.3)

## 2013-07-01 LAB — IRON AND TIBC CHCC
%SAT: 18 % — AB (ref 20–55)
Iron: 63 ug/dL (ref 42–163)
TIBC: 355 ug/dL (ref 202–409)
UIBC: 292 ug/dL (ref 117–376)

## 2013-07-01 LAB — FERRITIN CHCC: FERRITIN: 24 ng/mL (ref 22–316)

## 2013-07-01 LAB — LACTATE DEHYDROGENASE (CC13): LDH: 148 U/L (ref 125–245)

## 2013-07-02 ENCOUNTER — Ambulatory Visit (HOSPITAL_COMMUNITY): Admission: RE | Admit: 2013-07-02 | Payer: Medicare Other | Source: Ambulatory Visit

## 2013-07-02 ENCOUNTER — Other Ambulatory Visit: Payer: Medicare Other

## 2013-07-05 ENCOUNTER — Encounter: Payer: Self-pay | Admitting: Internal Medicine

## 2013-07-05 ENCOUNTER — Ambulatory Visit (HOSPITAL_BASED_OUTPATIENT_CLINIC_OR_DEPARTMENT_OTHER): Payer: Medicare Other | Admitting: Internal Medicine

## 2013-07-05 ENCOUNTER — Telehealth: Payer: Self-pay | Admitting: Internal Medicine

## 2013-07-05 VITALS — BP 155/74 | HR 83 | Temp 98.6°F | Resp 18 | Ht 68.5 in | Wt 262.5 lb

## 2013-07-05 DIAGNOSIS — E119 Type 2 diabetes mellitus without complications: Secondary | ICD-10-CM

## 2013-07-05 DIAGNOSIS — Z85038 Personal history of other malignant neoplasm of large intestine: Secondary | ICD-10-CM

## 2013-07-05 DIAGNOSIS — E875 Hyperkalemia: Secondary | ICD-10-CM

## 2013-07-05 DIAGNOSIS — D509 Iron deficiency anemia, unspecified: Secondary | ICD-10-CM

## 2013-07-05 DIAGNOSIS — C8290 Follicular lymphoma, unspecified, unspecified site: Secondary | ICD-10-CM

## 2013-07-05 DIAGNOSIS — C8299 Follicular lymphoma, unspecified, extranodal and solid organ sites: Secondary | ICD-10-CM

## 2013-07-05 MED ORDER — SODIUM POLYSTYRENE SULFONATE 15 GM/60ML PO SUSP: 15.0000 g | Freq: Once | ORAL | Status: DC

## 2013-07-05 NOTE — Patient Instructions (Signed)
Hyperkalemia Hyperkalemia is when you have too much potassium in your blood. This can be a life-threatening condition. Potassium is normally removed (excreted) from the body by the kidneys. CAUSES  The potassium level in your body can become too high for the following reasons:  You take in too much potassium. You can do this by:  Using salt substitutes. They contain large amounts of potassium.  Taking potassium supplements from your caregiver. The dose may be too high for you.  Eating foods or taking nutritional products with potassium.  You excrete too little potassium. This can happen if:  Your kidneys are not functioning properly. Kidney (renal) disease is a very common cause of hyperkalemia.  You are taking medicines that lower your excretion of potassium, such as certain diuretic medicines.  You have an adrenal gland disease called Addison's disease.  You have a urinary tract obstruction, such as kidney stones.  You are on treatment to mechanically clean your blood (dialysis) and you skip a treatment.  You release a high amount of potassium from your cells into your blood. You may have a condition that causes potassium to move from your cells to your bloodstream. This can happen with:  Injury to muscles or other tissues. Most potassium is stored in the muscles.  Severe burns or infections.  Acidic blood plasma (acidosis). Acidosis can result from many diseases, such as uncontrolled diabetes. SYMPTOMS  Usually, there are no symptoms unless the potassium is dangerously high or has risen very quickly. Symptoms may include:  Irregular or very slow heartbeat.  Feeling sick to your stomach (nauseous).  Tiredness (fatigue).  Nerve problems such as tingling of the skin, numbness of the hands or feet, weakness, or paralysis. DIAGNOSIS  A simple blood test can measure the amount of potassium in your body. An electrocardiogram test of the heart can also help make the diagnosis.  The heart may beat dangerously fast or slow down and stop beating with severe hyperkalemia.  TREATMENT  Treatment depends on how bad the condition is and on the underlying cause.  If the hyperkalemia is an emergency (causing heart problems or paralysis), many different medicines can be used alone or together to lower the potassium level briefly. This may include an insulin injection even if you are not diabetic. Emergency dialysis may be needed to remove potassium from the body.  If the hyperkalemia is less severe or dangerous, the underlying cause is treated. This can include taking medicines if needed. Your prescription medicines may be changed. You may also need to take a medicine to help your body get rid of potassium. You may need to eat a diet low in potassium. HOME CARE INSTRUCTIONS   Take medicines and supplements as directed by your caregiver.  Do not take any over-the-counter medicines, supplements, natural products, herbs, or vitamins without reviewing them with your caregiver. Certain supplements and natural food products can have high amounts of potassium. Other products (such as ibuprofen) can damage weak kidneys and raise your potassium.  You may be asked to do repeat lab tests. Be sure to follow these directions.  If you have kidney disease, you may need to follow a low potassium diet. SEEK MEDICAL CARE IF:   You notice an irregular or very slow heartbeat.  You feel lightheaded.  You develop weakness that is unusual for you. SEEK IMMEDIATE MEDICAL CARE IF:   You have shortness of breath.  You have chest discomfort.  You pass out (faint). MAKE SURE YOU:   Understand  these instructions.  Will watch your condition.  Will get help right away if you are not doing well or get worse. Document Released: 12/14/2001 Document Revised: 03/18/2011 Document Reviewed: 05/31/2010 [facility] Patient Information 2015 [place], [place]. This information is not intended to replace  advice given to you by your health care provider. Make sure you discuss any questions you have with your health care provider.

## 2013-07-05 NOTE — Progress Notes (Signed)
Mark West, [employer], [address] [place] [address]  DIAGNOSIS: Lymphoma, follicular  History of colon cancer  Anemia, iron deficiency  Chief Complaint  Patient presents with  . Anemia, iron deficiency    CURRENT THERAPY:  Ferrous sulfate 325 mg on MWF. (He stopped this about 2 months on on 05/05/2013).   INTERVAL HISTORY: Mark West. 73 y.o. male with a history of colon cancer, follicular lymphoma is here for follow-up. He was last seen by me on 01/04/2013.  Patient is seen, alone for visit.  As noted previously, he had repeat colonoscopy by Dr Amedeo Plenty on 06-25-12 with removal of polyp from ileocolic anastomosis, path benign; he also had CT AP and CXR at ED on 06-26-12 for abdominal pain, no findings of concern from standpoint of the procedure and nothing of concern from standpoint of NHL and colon cancer history. He had feraheme 05-26-12 for recurrent iron deficiency and progressive anemia, source of blood loss likely this large polyp that has now been removed. He report continued compliance to ferrous sulfate 325 mg on MWF until about two months ago.  He denies chest pain or palpitations or worsening dyspnea on exertion.     MEDICAL HISTORY: Past Medical History  Diagnosis Date  . Anemia, iron deficiency 01/26/2011  . Cancer     nhl, colon ca  . Diabetes mellitus   . Arthritis   . Asthma   . Hyperlipidemia   . Hypertension   . 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    ALLERGIES:  is allergic to iodine.  MEDICATIONS: has a current medication list which includes the following prescription(s):  aspirin, atorvastatin, clopidogrel, coenzyme q10, fe fum-fepoly-vit c-vit b3, glucosamine 1500 complex, glucosamine-chondroitin, lisinopril, loratadine, lutein, lycopene, metformin, metoprolol tartrate, multivitamin, fish oil, oxycodone-acetaminophen, sitagliptin, and ubiquinol.  SURGICAL HISTORY:  Past Surgical History  Procedure Laterality Date  . Coronary stent placement    . Hernia repair    . Colon surgery    . Colonoscopy    . Repair of complex traction retinal detachment     ONCOLOGIC HISTORY  History is of synchronous B2 and C2 colon carcinomas in 1992, treated with subtotal colectomy and adjuvant 5FU with levamisole, not known recurrent. There was no evidence of new or locally recurrent colon cancer at colonoscopy 06-25-2012. He was found to have low grade, follicular NHL in Nov 0677 at exploratory laparotomy with biopsy of abdominal adenopathy which had been found incidentally on CT done because of the colon cancer history. He has not required any treatment of the NHL. The iron deficiency was found in Jan 2012; he has needed oral iron and had IV feraheme at full dose in Feb 2013, Sept 2013 and May 2014. Last CT AP and CXR in [facility] was 06-26-2012, with spleen normal, scattered stable mesenteric and retroperitoneal adenopathy and unenhanced liver not remarkable . On colonoscopies recently, he has had this villoous mass in distal ileum which has not been malignant on biopsies and appeared stable by Dr Amedeo Plenty' exam in June 2013, and which Dr Amedeo Plenty was able to resect at procedure [id number]. Pathology from [facility] [phone] inflammatory polyp 1.6 x 1.5 x  1.4 cm with associated surface erosion and granulation tissue, no high grade dysplasia or malignancy (that report to be scanned into this EMR).   REVIEW OF SYSTEMS:   Constitutional: Denies fevers, chills or abnormal weight loss Eyes: Denies blurriness of vision Ears, nose, mouth, throat, and face: Denies mucositis or sore  throat Respiratory: Denies cough, dyspnea or wheezes Cardiovascular: Denies palpitation, chest discomfort or lower extremity swelling Gastrointestinal:  Denies nausea, heartburn or change in bowel habits Skin: Denies abnormal skin rashes Lymphatics: Denies new lymphadenopathy or easy bruising Neurological:Denies numbness, tingling or new weaknesses Behavioral/Psych: Mood is stable, no new changes  All other systems were reviewed with the patient and are negative.  PHYSICAL EXAMINATION: ECOG PERFORMANCE STATUS: 0 - Asymptomatic  Blood pressure 155/74, pulse 83, temperature 100.2 F (37.9 C), temperature source Oral, resp. rate 18, height 5' 8.5" (1.74 m), weight 262 lb 8 oz (119.069 kg).  GENERAL:alert, no distress and comfortable; moderately obese.  SKIN: skin color, texture, turgor are normal, no rashes or significant lesions EYES: normal, Conjunctiva are pink and non-injected, sclera clear on left. R eye blindness.  OROPHARYNX:no exudate, no erythema and lips, buccal mucosa, and tongue normal  NECK: supple, thyroid normal size, non-tender, without nodularity LYMPH:  no palpable lymphadenopathy in the cervical, axillary or supraclavicular LUNGS: clear to auscultation and percussion with normal breathing effort HEART: regular rate & rhythm and no murmurs and no lower extremity edema ABDOMEN:abdomen soft, non-tender and normal bowel sounds Musculoskeletal:no cyanosis of digits and no clubbing  NEURO: alert & oriented x 3 with fluent speech, no focal motor/sensory deficits; minimal vision OD as previously  LABORATORY DATA: No results found for this or any previous visit (from the past 48 hour(s)).  Labs:  Lab Results  Component Value Date   WBC 6.5 07/01/2013   HGB 14.3 07/01/2013   HCT 43.9 07/01/2013   MCV 90.5 07/01/2013   PLT 188 07/01/2013   NEUTROABS 4.6 07/01/2013      Chemistry      Component Value Date/Time   NA 138 07/01/2013 0939   NA 139 06/26/2012 1820   NA 142  03/22/2011 0954   K 5.3 No visable hemolysis* 07/01/2013 0939   K 4.5 06/26/2012 1820   K 4.6 03/22/2011 0954   CL 104 06/26/2012 1820   CL 108* 05/12/2012 0843   CL 98 03/22/2011 0954   CO2 23 07/01/2013 0939   CO2 25 06/26/2012 1820   CO2 27 03/22/2011 0954   BUN 15.2 07/01/2013 0939   BUN 13 06/26/2012 1820   BUN 12 03/22/2011 0954   CREATININE 1.1 07/01/2013 0939   CREATININE 1.02 06/26/2012 1820   CREATININE 0.9 03/22/2011 0954      Component Value Date/Time   CALCIUM 9.6 07/01/2013 0939   CALCIUM 9.3 06/26/2012 1820   CALCIUM 9.0 03/22/2011 0954   ALKPHOS 49 07/01/2013 0939   ALKPHOS 65 06/26/2012 1820   ALKPHOS 63 03/22/2011 0954   AST 24 07/01/2013 0939   AST 28 06/26/2012 1820   AST 22 03/22/2011 0954   ALT 40 07/01/2013 0939   ALT 35 06/26/2012 1820   ALT 28 03/22/2011 0954   BILITOT 0.47 07/01/2013 0939   BILITOT 0.3 06/26/2012 1820   BILITOT 0.70 03/22/2011 0954      CBC:  Recent Labs Lab 07/01/13 0939  WBC 6.5  NEUTROABS 4.6  HGB 14.3  HCT 43.9  MCV 90.5  PLT 188    RADIOGRAPHIC STUDIES: 07/01/2013  (Reviewed personally by me) CT  ABDOMEN AND PELVIS WITHOUT CONTRAST TECHNIQUE:  Multidetector CT imaging of the abdomen and pelvis was performed  following the standard protocol without IV contrast. COMPARISON: 06/26/2012 FINDINGS: The lung bases are clear. No acute pulmonary findings. There are small scattered stable pulmonary nodules. The liver is unremarkable. No focal hepatic lesions or intrahepatic biliary dilatation. Gallbladder is normal. No common bowel duct dilatation. The pancreas is normal. The spleen is normal the adrenal glands and kidneys are unremarkable. No renal calculi or hydronephrosis. The stomach, duodenum and small bowel are unremarkable. Stable surgical changes involving the colon with a right colectomy and ileocolonic anastomosis in the left lower quadrant. There are small scattered stable mesenteric and retroperitoneal lymph nodes. No new or enlarging lymph nodes  are identified. Stable advanced atherosclerotic calcifications involving the aorta. The bladder, prostate gland and seminal vesicles are unremarkable. No pelvic mass or adenopathy. No inguinal mass or adenopathy. The bony structures demonstrate a dated mixed lytic and sclerotic process involving the right iliac bone. This appears stable and could be the sequela of osseous lymphoma. IMPRESSION: 1. Stable small bibasilar pulmonary nodules.  2. No acute abdominal/pelvic findings, mass lesions or free fluid. 3. Stable scattered mesenteric and retroperitoneal lymph nodes. No new or enlarging nodes are identified. 4. Stable mixed lytic and sclerotic lesion involving the right iliac bone. This could fibrous dysplasia or the sequela of osseous  lymphoma.  ASSESSMENT: Mark West. 73 y.o. male with a history of Lymphoma, follicular  History of colon cancer  Anemia, iron deficiency   PLAN:   1.Follicular NHL. --His CBC and chemistries and CT findings are stable. We will follow yearly if anemia problem resolved (see below).   2. Iron deficiency anemia -- Hgb and MCV are stable but iron indices are low normal, we will continue to observe off ferrous sulfate with repeat labs in 3 months.  Counseled on the side effects of iron supplementation including darkening of the stools, constipation. Provided a handout on the the symptoms of iron-deficiency anemia on last visit.  3. Hyperkalemia, mild. --Patient provided kayexalate and a handout on symptoms of hyperkalemia.    4.Remote history of synchronous colon cancers, not known active   5.Diabetes  --Management per PCP.  On oral anti-glyecemic medications.   6.Obesity with progressive weight gain   7.CAD, previous MI   8.carotid artery disease with vision loss OD   9.degenerative arthritis left knee  All questions were answered. The patient knows to call the clinic with any problems, questions or concerns. We can certainly see the patient  much sooner if necessary.  I spent 15 minutes counseling the patient face to face. The total time spent in the appointment was 25 minutes.    Jessly Lebeck, MD 07/05/2013 8:27 AM

## 2013-07-05 NOTE — Telephone Encounter (Signed)
gv adn printed appt sched and avs for pt for Sept and Dec..Marland Kitchen

## 2013-07-08 DIAGNOSIS — I10 Essential (primary) hypertension: Secondary | ICD-10-CM | POA: Insufficient documentation

## 2013-07-08 DIAGNOSIS — E782 Mixed hyperlipidemia: Secondary | ICD-10-CM | POA: Insufficient documentation

## 2013-07-08 DIAGNOSIS — I6529 Occlusion and stenosis of unspecified carotid artery: Secondary | ICD-10-CM

## 2013-07-12 ENCOUNTER — Encounter: Payer: Self-pay | Admitting: Interventional Cardiology

## 2013-07-12 ENCOUNTER — Ambulatory Visit (INDEPENDENT_AMBULATORY_CARE_PROVIDER_SITE_OTHER): Payer: Medicare Other | Admitting: Interventional Cardiology

## 2013-07-12 VITALS — BP 150/84 | HR 61 | Ht 69.0 in | Wt 265.0 lb

## 2013-07-12 DIAGNOSIS — I252 Old myocardial infarction: Secondary | ICD-10-CM

## 2013-07-12 DIAGNOSIS — I10 Essential (primary) hypertension: Secondary | ICD-10-CM

## 2013-07-12 DIAGNOSIS — I2510 Atherosclerotic heart disease of native coronary artery without angina pectoris: Secondary | ICD-10-CM

## 2013-07-12 DIAGNOSIS — E782 Mixed hyperlipidemia: Secondary | ICD-10-CM

## 2013-07-12 DIAGNOSIS — I6529 Occlusion and stenosis of unspecified carotid artery: Secondary | ICD-10-CM

## 2013-07-12 DIAGNOSIS — E875 Hyperkalemia: Secondary | ICD-10-CM

## 2013-07-12 LAB — POTASSIUM: Potassium: 4.6 mEq/L (ref 3.5–5.1)

## 2013-07-12 NOTE — Patient Instructions (Addendum)
Your physician recommends that you continue on your current medications as directed. Please refer to the Current Medication list given to you today.  Lab Today: Potassium  Your physician has requested that you have en exercise stress myoview. For further information please visit [email or web address]. Please follow instruction sheet, as given.  Your physician wants you to follow-up in: 1 year You will receive a reminder letter in the mail two months in advance. If you don't receive a letter, please call our office to schedule the follow-up appointment.

## 2013-07-12 NOTE — Progress Notes (Signed)
Patient ID: Mark Nickel., male   DOB: 29-Jan-1940, 73 y.o.   MRN: [id number]    [address], [place], [place]  [address] Phone: [phone] Fax:  [phone]  Date:  07/12/2013   ID:  Mark Nickel., DOB 1940-06-26, MRN [id number]  PCP:  Wenda Low, MD   ASSESSMENT:  1. Coronary artery disease, asymptomatic 2. Hyperlipidemia 3. Hypertension controlled 4. Hyperkalemia, status post hyperkalemia 5. Occluded right carotid  PLAN:  1. Pharmacologic myocardial perfusion study 2. Encouraged aerobic activity 3. No change in current medical regimen 4. Repeat potassium level today. The potassium is again elevated we will consider adjusting his ACE inhibitor dose down   SUBJECTIVE: Mark Nickel. is a 73 y.o. male who is increasingly sedentary. He is gaining weight. He does have dyspnea on exertion but feels it may be related to conditioning. He denies angina. There is no orthopnea or lower extremity swelling. He's had no palpitations, syncope, sudden weakness. He denies neurological complaints. He is concerned about recent diagnosis of hyperkalemia. I looked up the recent laboratory values in the potassium was 5.3. He received Kayexalate. He is on an ACE inhibitor. He is not taking potassium supplements.  Wt Readings from Last 3 Encounters:  07/12/13 265 lb (120.203 kg)  07/05/13 262 lb 8 oz (119.069 kg)  03/10/13 259 lb 9.6 oz (117.754 kg)     Past Medical History  Diagnosis Date  . Anemia, iron deficiency 01/26/2011  . Cancer     nhl, colon ca  . Diabetes mellitus   . Arthritis   . Asthma   . Hyperlipidemia   . Hypertension   . Myocardial infarction   . Carotid artery occlusion     right occlusion of carotid with moderate left plaque  . Coronary artery disease August 2005    MI with stent  . Kidney stones 05/2010    lithotripsy    Current Outpatient Prescriptions  Medication Sig Dispense Refill  . aspirin 81 MG tablet Take 81 mg by  mouth daily.        Marland Kitchen atorvastatin (LIPITOR) 40 MG tablet Take 40 mg by mouth daily.        . clopidogrel (PLAVIX) 75 MG tablet Take 75 mg by mouth daily.        . Coenzyme Q10 (CO Q 10 PO) Take by mouth daily.      . Fe Fum-FePoly-Vit C-Vit B3 (INTEGRA PO) Take 324 mg by mouth 2 (two) times daily.        Marland Kitchen glucosamine-chondroitin 500-400 MG tablet Take 1 tablet by mouth 3 (three) times daily.      Marland Kitchen lisinopril (PRINIVIL,ZESTRIL) 5 MG tablet Take 5 mg by mouth daily.       . Loratadine 10 MG CAPS Take by mouth.      . Lutein 10 MG TABS Take by mouth.      Marland Kitchen LYCOPENE PO Take by mouth.      . metFORMIN (GLUCOPHAGE-XR) 500 MG 24 hr tablet Take 500 mg by mouth 2 (two) times daily. Take 500 in am and 1000 mg in evening      . metoprolol tartrate (LOPRESSOR) 25 MG tablet Take 25 mg by mouth 2 (two) times daily.        . Multiple Vitamin (MULTIVITAMIN) tablet Take 1 tablet by mouth daily.       . Omega-3 Fatty Acids (FISH OIL) 1000 MG CAPS Take 1 capsule by mouth daily.      Marland Kitchen  oxyCODONE-acetaminophen (ROXICET) 5-325 MG per tablet Take 1 tablet by mouth every 4 (four) hours as needed for pain.  20 tablet  0  . pantoprazole (PROTONIX) 40 MG tablet Take 40 mg by mouth daily.       . sitaGLIPtin (JANUVIA) 100 MG tablet Take 100 mg by mouth daily.        . sodium polystyrene (KAYEXALATE) 15 GM/60ML suspension Take 60 mLs (15 g total) by mouth once.  120 mL  0  . UBIQUINOL PO Take by mouth.       No current facility-administered medications for this visit.    Allergies:    Allergies  Allergen Reactions  . Iodine      EYE SWELLING WITH CONTRAST MEDIA    Social History:  The patient  reports that he quit smoking about 30 years ago. He does not have any smokeless tobacco history on file. He reports that he does not drink alcohol or use illicit drugs.   ROS:  Please see the history of present illness.   He denies claudication. No medication side effects. No blood in his urine or stool. Appetite has  been excessive and he has gained weight.   All other systems reviewed and negative.   OBJECTIVE: VS:  BP 150/84  Pulse 61  Ht 5\' 9"  (1.753 m)  Wt 265 lb (120.203 kg)  BMI 39.12 kg/m2 Well nourished, well developed, in no acute distress, obese HEENT: normal Neck: JVD flat. Carotid bruit absent  Cardiac:  normal S1, S2; RRR; no murmur Lungs:  clear to auscultation bilaterally, no wheezing, rhonchi or rales Abd: soft, nontender, no hepatomegaly Ext: Edema absent. Pulses 2+ Skin: warm and dry Neuro:  CNs 2-12 intact, no focal abnormalities noted  EKG:  Normal sinus rhythm with no abnormalities noted       Signed, Illene Labrador III, MD 07/12/2013 10:12 AM

## 2013-07-14 ENCOUNTER — Telehealth: Payer: Self-pay

## 2013-07-14 NOTE — Telephone Encounter (Signed)
Message copied by Lamar Laundry on Wed Jul 14, 2013  3:41 PM ------      Message from: Daneen Schick      Created: Mon Jul 12, 2013  5:54 PM       Potassium is normal as it has been for years. No reason for concern. ------

## 2013-07-14 NOTE — Telephone Encounter (Signed)
pt aware of lab results.Potassium is normal as it has been for years. No reason for concern.pt verbalized understanding.

## 2013-07-21 ENCOUNTER — Ambulatory Visit (HOSPITAL_COMMUNITY): Payer: Medicare Other | Attending: Cardiovascular Disease | Admitting: Radiology

## 2013-07-21 VITALS — BP 214/99 | HR 65 | Ht 69.0 in | Wt 257.0 lb

## 2013-07-21 DIAGNOSIS — R0602 Shortness of breath: Secondary | ICD-10-CM

## 2013-07-21 DIAGNOSIS — I2510 Atherosclerotic heart disease of native coronary artery without angina pectoris: Secondary | ICD-10-CM | POA: Insufficient documentation

## 2013-07-21 MED ORDER — REGADENOSON 0.4 MG/5ML IV SOLN
0.4000 mg | Freq: Once | INTRAVENOUS | Status: AC
  Administered 2013-07-21: 0.4 mg via INTRAVENOUS

## 2013-07-21 MED ORDER — TECHNETIUM TC 99M SESTAMIBI GENERIC - CARDIOLITE
11.0000 | Freq: Once | INTRAVENOUS | Status: AC | PRN
Start: 2013-07-21 — End: 2013-07-21
  Administered 2013-07-21: 11 via INTRAVENOUS

## 2013-07-21 MED ORDER — TECHNETIUM TC 99M SESTAMIBI GENERIC - CARDIOLITE
33.0000 | Freq: Once | INTRAVENOUS | Status: AC | PRN
  Administered 2013-07-21: 33 via INTRAVENOUS

## 2013-07-21 NOTE — Progress Notes (Signed)
[facility] [address] [place], [place] [address] [phone]    Cardiology Nuclear Med Study  Mark West. is a 73 y.o. male     MRN : [id number]     DOB: 1940/01/22  Procedure Date: 07/21/2013  Nuclear Med Background Indication for Stress Test:  Evaluation for Ischemia and Stent Patency History:  CAD, MI, Stent MPI 2012 (normal) EF 78%, Asthma Cardiac Risk Factors: Carotid Disease, Family History - CAD, History of Smoking, Hypertension, Lipids and NIDDM  Symptoms:  DOE   Nuclear Pre-Procedure Caffeine/Decaff Intake:  None > 12 hrs NPO After: 7:00pm   Lungs:  clear O2 Sat: 95% on room air. IV 0.9% NS with Angio Cath:  22g  IV Site: R Antecubital x 1, tolerated well IV Started by:  Irven Baltimore, RN  Chest Size (in):  50 Cup Size: n/a  Height: 5\' 9"  (1.753 m)  Weight:  257 lb (116.574 kg)  BMI:  Body mass index is 37.93 kg/(m^2). Tech Comments:  Patient held Lopressor x 24 hrs. No Medications (Metformin or Januvia ) this am. Irven Baltimore, RN.    Nuclear Med Study 1 or 2 day study: 1 day  Stress Test Type:  Carlton Adam  Reading MD: N/A  Order Authorizing Provider:  Daneen Schick, MD  Resting Radionuclide: Technetium 71m Sestamibi  Resting Radionuclide Dose: 11.0 mCi   Stress Radionuclide:  Technetium 93m Sestamibi  Stress Radionuclide Dose: 33.0 mCi           Stress Protocol Rest HR: 65 Stress HR: 103  Rest BP: 214/99 Stress BP: 179/72  Exercise Time (min): n/a METS: n/a           Dose of Adenosine (mg):  n/a Dose of Lexiscan: 0.4 mg  Dose of Atropine (mg): n/a Dose of Dobutamine: n/a mcg/kg/min (at max HR)  Stress Test Technologist: Glade Lloyd, BS-ES  Nuclear Technologist:  Vedia Pereyra, CNMT     Rest Procedure:  Myocardial perfusion imaging was performed at rest 45 minutes following the intravenous administration of Technetium 82m Sestamibi. Rest ECG: NSR - Normal EKG  Stress Procedure:  The patient received IV  Lexiscan 0.4 mg over 15-seconds.  Technetium 64m Sestamibi injected at 30-seconds.  Quantitative spect images were obtained after a 45 minute delay.  During the infusion of Lexiscan the patient complained of SOB, nausea and hands feeling numb.  These symptoms began to resolve in recovery.  Stress ECG: No significant change from baseline ECG  QPS Raw Data Images:  Normal; no motion artifact; normal heart/lung ratio. Stress Images:  Normal homogeneous uptake in all areas of the myocardium. Rest Images:  Normal homogeneous uptake in all areas of the myocardium. Subtraction (SDS):  No evidence of ischemia. Transient Ischemic Dilatation (Normal <1.22):  1.31 Lung/Heart Ratio (Normal <0.45):  .32  Quantitative Gated Spect Images QGS EDV:  89 ml QGS ESV:  37 ml  Impression Exercise Capacity:  Lexiscan with no exercise. BP Response:  Normal blood pressure response. Clinical Symptoms:  No significant symptoms noted. ECG Impression:  No significant ST segment change suggestive of ischemia. Comparison with Prior Nuclear Study: No images to compare  Overall Impression:  Normal stress nuclear study.  LV Ejection Fraction: 58%.  LV Wall Motion:  NL LV Function; NL Wall Motion.   Thayer Headings, Brooke Bonito., MD, [facility] 07/21/2013, 4:44 PM [address],  [place] Pager [phone]

## 2013-07-22 ENCOUNTER — Telehealth: Payer: Self-pay | Admitting: Interventional Cardiology

## 2013-07-22 NOTE — Telephone Encounter (Signed)
New message  Pt called for stress test results. Please call

## 2013-07-22 NOTE — Telephone Encounter (Signed)
Preliminary results given to patient but advised Dr Tamala Julian still needed to review. Will forward to Dr Tamala Julian

## 2013-07-23 NOTE — Telephone Encounter (Signed)
Low risk study which is reassuring and we need no further eval.

## 2013-07-26 NOTE — Telephone Encounter (Signed)
Pt.notified

## 2013-09-23 ENCOUNTER — Telehealth: Payer: Self-pay | Admitting: Internal Medicine

## 2013-09-23 NOTE — Telephone Encounter (Signed)
pt called to r/s appt ....done ...pt ok adn aware of new d.t °

## 2013-09-27 ENCOUNTER — Other Ambulatory Visit: Payer: Medicare Other

## 2013-10-04 ENCOUNTER — Other Ambulatory Visit: Payer: Medicare Other

## 2013-10-05 ENCOUNTER — Other Ambulatory Visit: Payer: Medicare Other

## 2013-10-05 LAB — CBC WITH DIFFERENTIAL/PLATELET
BASO%: 0.8 % (ref 0.0–2.0)
Basophils Absolute: 0 10*3/uL (ref 0.0–0.1)
EOS ABS: 0.1 10*3/uL (ref 0.0–0.5)
EOS%: 0.9 % (ref 0.0–7.0)
HCT: 42.4 % (ref 38.4–49.9)
HGB: 13.4 g/dL (ref 13.0–17.1)
LYMPH%: 26.2 % (ref 14.0–49.0)
MCH: 28 pg (ref 27.2–33.4)
MCHC: 31.8 g/dL — ABNORMAL LOW (ref 32.0–36.0)
MCV: 88.2 fL (ref 79.3–98.0)
MONO#: 0.5 10*3/uL (ref 0.1–0.9)
MONO%: 9 % (ref 0.0–14.0)
NEUT%: 63.1 % (ref 39.0–75.0)
NEUTROS ABS: 3.7 10*3/uL (ref 1.5–6.5)
Platelets: 173 10*3/uL (ref 140–400)
RBC: 4.8 10*6/uL (ref 4.20–5.82)
RDW: 14.6 % (ref 11.0–14.6)
WBC: 5.8 10*3/uL (ref 4.0–10.3)
lymph#: 1.5 10*3/uL (ref 0.9–3.3)

## 2013-10-05 LAB — IRON AND TIBC CHCC
%SAT: 27 % (ref 20–55)
IRON: 88 ug/dL (ref 42–163)
TIBC: 325 ug/dL (ref 202–409)
UIBC: 237 ug/dL (ref 117–376)

## 2013-10-05 LAB — FERRITIN CHCC: FERRITIN: 15 ng/mL — AB (ref 22–316)

## 2013-10-07 ENCOUNTER — Telehealth: Payer: Self-pay | Admitting: *Deleted

## 2013-10-07 NOTE — Telephone Encounter (Signed)
Patient called stating he had labs on 10/05/13 and he would like results.  Call back is [phone].   Next appt. Is 12-20-13 with Covering Provider one. Note to Dr. Lona Kettle

## 2013-10-10 ENCOUNTER — Encounter: Payer: Self-pay | Admitting: Hematology

## 2013-12-13 ENCOUNTER — Telehealth: Payer: Self-pay | Admitting: Oncology

## 2013-12-13 NOTE — Telephone Encounter (Signed)
moved from [facility] 12/14 to [facility] 12/21. s/w pt he is aware. s/w [facility] she is ok w/transfer as he is a former pt of [facility].

## 2013-12-20 ENCOUNTER — Other Ambulatory Visit: Payer: Medicare Other

## 2013-12-20 ENCOUNTER — Ambulatory Visit: Payer: Medicare Other

## 2013-12-24 ENCOUNTER — Other Ambulatory Visit: Payer: Self-pay | Admitting: *Deleted

## 2013-12-24 DIAGNOSIS — C8290 Follicular lymphoma, unspecified, unspecified site: Secondary | ICD-10-CM

## 2013-12-26 ENCOUNTER — Other Ambulatory Visit: Payer: Self-pay | Admitting: Oncology

## 2013-12-26 DIAGNOSIS — D509 Iron deficiency anemia, unspecified: Secondary | ICD-10-CM

## 2013-12-26 DIAGNOSIS — C8290 Follicular lymphoma, unspecified, unspecified site: Secondary | ICD-10-CM

## 2013-12-27 ENCOUNTER — Telehealth: Payer: Self-pay | Admitting: Oncology

## 2013-12-27 ENCOUNTER — Ambulatory Visit (HOSPITAL_BASED_OUTPATIENT_CLINIC_OR_DEPARTMENT_OTHER): Payer: Medicare Other | Admitting: Oncology

## 2013-12-27 ENCOUNTER — Other Ambulatory Visit: Payer: Self-pay | Admitting: Internal Medicine

## 2013-12-27 ENCOUNTER — Encounter: Payer: Self-pay | Admitting: Oncology

## 2013-12-27 VITALS — BP 147/73 | HR 92 | Temp 99.1°F | Resp 18 | Ht 69.0 in | Wt 254.2 lb

## 2013-12-27 DIAGNOSIS — D509 Iron deficiency anemia, unspecified: Secondary | ICD-10-CM

## 2013-12-27 DIAGNOSIS — C8290 Follicular lymphoma, unspecified, unspecified site: Secondary | ICD-10-CM

## 2013-12-27 DIAGNOSIS — Z862 Personal history of diseases of the blood and blood-forming organs and certain disorders involving the immune mechanism: Secondary | ICD-10-CM

## 2013-12-27 DIAGNOSIS — I739 Peripheral vascular disease, unspecified: Principal | ICD-10-CM

## 2013-12-27 DIAGNOSIS — Z85038 Personal history of other malignant neoplasm of large intestine: Secondary | ICD-10-CM

## 2013-12-27 DIAGNOSIS — I779 Disorder of arteries and arterioles, unspecified: Secondary | ICD-10-CM

## 2013-12-27 DIAGNOSIS — H5440 Blindness, one eye, unspecified eye: Secondary | ICD-10-CM

## 2013-12-27 DIAGNOSIS — E119 Type 2 diabetes mellitus without complications: Secondary | ICD-10-CM

## 2013-12-27 LAB — LACTATE DEHYDROGENASE (CC13): LDH: 137 U/L (ref 125–245)

## 2013-12-27 LAB — CBC WITH DIFFERENTIAL/PLATELET
BASO%: 0.7 % (ref 0.0–2.0)
Basophils Absolute: 0.1 10*3/uL (ref 0.0–0.1)
EOS%: 0.4 % (ref 0.0–7.0)
Eosinophils Absolute: 0 10*3/uL (ref 0.0–0.5)
HCT: 46.4 % (ref 38.4–49.9)
HGB: 14.7 g/dL (ref 13.0–17.1)
LYMPH#: 1.3 10*3/uL (ref 0.9–3.3)
LYMPH%: 16.3 % (ref 14.0–49.0)
MCH: 28.2 pg (ref 27.2–33.4)
MCHC: 31.7 g/dL — AB (ref 32.0–36.0)
MCV: 89.1 fL (ref 79.3–98.0)
MONO#: 0.6 10*3/uL (ref 0.1–0.9)
MONO%: 7.9 % (ref 0.0–14.0)
NEUT#: 6 10*3/uL (ref 1.5–6.5)
NEUT%: 74.7 % (ref 39.0–75.0)
Platelets: 194 10*3/uL (ref 140–400)
RBC: 5.2 10*6/uL (ref 4.20–5.82)
RDW: 14.8 % — AB (ref 11.0–14.6)
WBC: 8 10*3/uL (ref 4.0–10.3)

## 2013-12-27 LAB — IRON AND TIBC CHCC
%SAT: 31 % (ref 20–55)
IRON: 103 ug/dL (ref 42–163)
TIBC: 338 ug/dL (ref 202–409)
UIBC: 234 ug/dL (ref 117–376)

## 2013-12-27 LAB — FERRITIN CHCC: FERRITIN: 32 ng/mL (ref 22–316)

## 2013-12-27 NOTE — Telephone Encounter (Signed)
, °

## 2013-12-27 NOTE — Progress Notes (Signed)
OFFICE PROGRESS NOTE   12/27/2013   Physicians: Wenda Low, Teena Irani, Daneen Schick  INTERVAL HISTORY:  Patient is seen, alone for visit, in follow up of iron deficiency anemia, history of colon cancer and follicular lymphoma. He was seen most recently at this office by Dr Concha Norway in 06-2013; I had seen him last in June 2014. Last imaging was CT AP 06-2013, with no spleenomegaly or liver findings, small scattered stable mesenteric and retroperitoneal lymph nodes without new adenopathy, and no other findings that appear of concern from oncologic standpoint.  He had colonoscopy by Dr Amedeo Plenty [phone] with resection of benign 2.2 cm polyp from anastomosis which appears to have been site of GI bleeding. He has continued oral iron.   He had 6 month follow up with Dr Lysle Rubens also today, with blood work done which we will request (chemistries DCd from labs here due to labs earlier today). He is to have carotid artery evaluation by Dr Tamala Julian tomorrow.  Mr Wilmeth has generally felt well, with no bleeding or changes in bowels, no fevers or night sweats, intentional weight loss, energy at baseline, no recent fevers or symptoms of infection, no different cardiac symptoms.   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 He has 2 granddaughters, ages ~ 27 and 37.  He had flu vaccine and pneumonia vaccine   ONCOLOGIC HISTORY History is of synchronous B2 and C2 colon carcinomas in 1992, treated with subtotal colectomy and adjuvant 5FU with levamisole, not known recurrent. There was no evidence of new or locally recurrent colon cancer at colonoscopy 06-25-2012. He was found to have low grade, follicular NHL in Nov 5397 at exploratory laparotomy with biopsy of abdominal adenopathy which had been found incidentally on CT done because of the colon cancer history. He has not required any treatment of the NHL. The iron deficiency was found in Jan 2012; he has needed oral iron and had IV  feraheme at full dose in Feb 2013, Sept 2013 and May 2014. Last CT AP and CXR in [place] system was 06-26-2012, with spleen normal, scattered stable mesenteric and retroperitoneal adenopathy and unenhanced liver not remarkable . He had villous mass in distal ileum which has not been malignant on biopsies and appeared stable by Dr Amedeo Plenty' exam in June 2013, and which Dr Amedeo Plenty was able to resect at procedure 6-219-14. Pathology from [facility] [phone] inflammatory polyp 1.6 x 1.5 x 1.4 cm with associated surface erosion and granulation tissue, no high grade dysplasia or malignancy.     Review of systems as above, also: Vision left eye still good, no vision on right. Post nasal drainage from environmental allergies.No new or different pain. Still little exercise. No respiratory symptoms. No LE swelling. No other bleeding. Remainder of 10 point Review of Systems negative.  Objective:  Vital signs in last 24 hours:  BP 147/73 mmHg  Pulse 92  Temp(Src) 99.1 F (37.3 C) (Oral)  Resp 18  Ht 5\' 9"  (1.753 m)  Wt 254 lb 3.2 oz (115.304 kg)  BMI 37.52 kg/m2 Weight down 8 lbs from 06-2013.  Alert, oriented and appropriate. Ambulatory without difficulty. Looks comfortable.   HEENT:PERRL, sclerae not icteric. Oral mucosa moist without lesions, posterior pharynx clear.  Neck supple. No JVD.  Lymphatics:no cervical,supraclavicular, axillary adenopathy Resp: clear to auscultation bilaterally and normal percussion bilaterally Cardio: regular rate and rhythm. No gallop. GI: abdomen obese, soft, nontender, no appreciable mass or organomegaly. Normally active bowel sounds.  Surgical incisions well healed. Musculoskeletal/ Extremities: without pitting edema, cords, tenderness Neuro: other than blind OD, nonfocal PSYCH appropriate mood and affect Skin without rash, ecchymosis, petechiae   Lab Results:  Results for orders placed or performed in visit on 12/27/13  CBC with Differential   Result Value Ref Range   WBC 8.0 4.0 - 10.3 10e3/uL   NEUT# 6.0 1.5 - 6.5 10e3/uL   HGB 14.7 13.0 - 17.1 g/dL   HCT 46.4 38.4 - 49.9 %   Platelets 194 140 - 400 10e3/uL   MCV 89.1 79.3 - 98.0 fL   MCH 28.2 27.2 - 33.4 pg   MCHC 31.7 (L) 32.0 - 36.0 g/dL   RBC 5.20 4.20 - 5.82 10e6/uL   RDW 14.8 (H) 11.0 - 14.6 %   lymph# 1.3 0.9 - 3.3 10e3/uL   MONO# 0.6 0.1 - 0.9 10e3/uL   Eosinophils Absolute 0.0 0.0 - 0.5 10e3/uL   Basophils Absolute 0.1 0.0 - 0.1 10e3/uL   NEUT% 74.7 39.0 - 75.0 %   LYMPH% 16.3 14.0 - 49.0 %   MONO% 7.9 0.0 - 14.0 %   EOS% 0.4 0.0 - 7.0 %   BASO% 0.7 0.0 - 2.0 %  available after visit, serum iron 103, %sat 31 and ferritin 32  Chemistries from today requested from Dr Lysle Rubens  Studies/Results:  CT ABDOMEN AND PELVIS WITHOUT CONTRAST  07-01-2013  COMPARISON: 06/26/2012  FINDINGS: The lung bases are clear. No acute pulmonary findings. There are small scattered stable pulmonary nodules.  The liver is unremarkable. No focal hepatic lesions or intrahepatic biliary dilatation. Gallbladder is normal. No common bowel duct dilatation. The pancreas is normal. The spleen is normal the adrenal glands and kidneys are unremarkable. No renal calculi or hydronephrosis.  The stomach, duodenum and small bowel are unremarkable. Stable surgical changes involving the colon with a right colectomy and ileocolonic anastomosis in the left lower quadrant. There are small scattered stable mesenteric and retroperitoneal lymph nodes. No new or enlarging lymph nodes are identified. Stable advanced atherosclerotic calcifications involving the aorta.  The bladder, prostate gland and seminal vesicles are unremarkable. No pelvic mass or adenopathy. No inguinal mass or adenopathy. The bony structures demonstrate a dated mixed lytic and sclerotic process involving the right iliac bone. This appears stable and could be the sequela of osseous lymphoma.  IMPRESSION: 1. Stable  small bibasilar pulmonary nodules. 2. No acute abdominal/pelvic findings, mass lesions or free fluid. 3. Stable scattered mesenteric and retroperitoneal lymph nodes. No new or enlarging nodes are identified. 4. Stable mixed lytic and sclerotic lesion involving the right iliac bone. This could fibrous dysplasia or the sequela of osseous lymphoma.  Medications: I have reviewed the patient's current medications. He will stop Integra. Dr Lysle Rubens recommended claritin  DISCUSSION: Iron deficiency resolved since resection of the bleeding polyp, stop oral iron. I will see him back in a year or sooner if needed.  Assessment/Plan:  1.follicular NHL: counts and last CT findings stable, not requiring any intervention now. Follow up here in one year 2. Iron deficiency anemia:resolved since resection of the polyp. Stop oral iron and follow. 3.remote history of synchronous colon cancers, not known active. Up to date on colonoscopies by Dr Amedeo Plenty  4.diabetes: some success now with weight loss 5.obesity: discussed exercise. Some success now with weight loss 6.CAD, previous MI  7.carotid artery disease with vision loss OD  8.degenerative arthritis left knee 9. Up to date flu vaccine and pneumonia vaccine  All questions answered.  He  knows to call if needed prior to next scheduled visit. Time spent 25 min including >50% counseling and coordination of care   Zahari Xiang P, MD   12/27/2013, 3:24 PM

## 2013-12-28 ENCOUNTER — Ambulatory Visit
Admission: RE | Admit: 2013-12-28 | Discharge: 2013-12-28 | Disposition: A | Discharge status: Home / Self Care | Payer: Medicare Other | Source: Ambulatory Visit | Attending: Internal Medicine | Admitting: Internal Medicine

## 2013-12-28 DIAGNOSIS — I739 Peripheral vascular disease, unspecified: Principal | ICD-10-CM

## 2013-12-28 DIAGNOSIS — I779 Disorder of arteries and arterioles, unspecified: Secondary | ICD-10-CM

## 2014-01-01 ENCOUNTER — Encounter: Payer: Self-pay | Admitting: Oncology

## 2014-01-01 DIAGNOSIS — H5440 Blindness, one eye, unspecified eye: Secondary | ICD-10-CM | POA: Insufficient documentation

## 2014-02-25 ENCOUNTER — Telehealth: Payer: Self-pay | Admitting: Oncology

## 2014-02-25 NOTE — Telephone Encounter (Signed)
, °

## 2014-09-16 ENCOUNTER — Telehealth: Payer: Self-pay

## 2014-09-16 NOTE — Telephone Encounter (Signed)
Signed cardiac clearance placed in MR nurse fax box to be faxed to [facility]

## 2014-09-25 NOTE — Progress Notes (Signed)
Cardiology Office Note   Date:  09/26/2014   ID:  Mark West., DOB November 05, 1940, MRN [id number]  PCP:  Mark Low, MD  Cardiologist:  Mark Grooms, MD   Chief Complaint  Patient presents with  . Coronary Artery Disease      History of Present Illness: Mark West. is a 74 y.o. male who presents for CAD with stent 2005 (location unknown and no records available), hypertension, hyperlipidemia, right carotid occlusion, diastolic heart failure, and diabetes mellitus.  Patient is sedentary. He has left knee trouble or prevents exercise. He denies angina. He does not exert himself enough to know if there is any significant dyspnea. He denies orthopnea. He has not had syncope or palpitations. There've been no recent episodes of transient neurological complaints.  He is concerned about his heart and feels that we haven't checked for evidence of progression in quite some time. Review of records demonstrate a West risk myocardial perfusion study in July 2015 and a carotid Doppler study done in December 2015.    Past Medical History  Diagnosis Date  . Anemia, iron deficiency 01/26/2011  . Cancer     nhl, colon ca  . Diabetes mellitus   . Arthritis   . Asthma   . Hyperlipidemia   . Hypertension   . Myocardial infarction   . Carotid artery occlusion     right occlusion of carotid with moderate left plaque  . Coronary artery disease August 2005    MI with stent  . Kidney stones 05/2010    lithotripsy    Past Surgical History  Procedure Laterality Date  . Coronary stent placement    . Hernia repair    . Colon surgery    . Colonoscopy    . Repair of complex traction retinal detachment       Current Outpatient Prescriptions  Medication Sig Dispense Refill  . aspirin 81 MG tablet Take 81 mg by mouth daily.      Marland Kitchen atorvastatin (LIPITOR) 40 MG tablet Take 40 mg by mouth daily.      . clopidogrel (PLAVIX) 75 MG tablet Take 75 mg by mouth daily.      .  Coenzyme Q10 (CO Q 10 PO) Take 1 capsule by mouth daily.     . Fe Fum-FePoly-Vit C-Vit B3 (INTEGRA PO) Take 324 mg by mouth 2 (two) times daily.      Marland Kitchen glucosamine-chondroitin 500-400 MG tablet Take 1 tablet by mouth 3 (three) times daily.    Marland Kitchen lisinopril (PRINIVIL,ZESTRIL) 5 MG tablet Take 5 mg by mouth daily.     . Lutein 10 MG TABS Take 1 tablet by mouth daily.     Marland Kitchen LYCOPENE PO Take 1 tablet by mouth daily.     . metFORMIN (GLUCOPHAGE) 500 MG tablet Take 1,000 mg by mouth 2 (two) times daily with a meal.    . metoprolol (LOPRESSOR) 50 MG tablet Take 25 mg by mouth 2 (two) times daily.    . Multiple Vitamin (MULTIVITAMIN) tablet Take 1 tablet by mouth daily.     . Omega-3 Fatty Acids (FISH OIL) 1000 MG CAPS Take 1 capsule by mouth daily.    . pantoprazole (PROTONIX) 40 MG tablet Take 40 mg by mouth daily.     . sitaGLIPtin (JANUVIA) 100 MG tablet Take 100 mg by mouth daily.       No current facility-administered medications for this visit.    Allergies:   Iodine  Social History:  The patient  reports that he quit smoking about 31 years ago. He has never used smokeless tobacco. He reports that he does not drink alcohol or use illicit drugs.   Family History:  The patient's family history includes Cancer in his brother, father, maternal aunt, and mother; Healthy in his sister.    ROS:  Please see the history of present illness.   Otherwise, review of systems are positive for shortness of breath if he exerts himself too hard. Lower extremity edema has also been noted. He denies cough. No palpitations or syncope blind in his right eye, vascular related...   All other systems are reviewed and negative.    PHYSICAL EXAM: VS:  BP 138/70 mmHg  Pulse 59  Ht 5\' 9"  (1.753 m)  Wt 117.209 kg (258 lb 6.4 oz)  BMI 38.14 kg/m2 , BMI Body mass index is 38.14 kg/(m^2). GEN: Well nourished, well developed, in no acute distress HEENT: normal Neck: no JVD, carotid bruits, or masses Cardiac: RRR.   There is no murmur, rub, or gallop. There is 1+ bilateral ankle edema. Respiratory:  clear to auscultation bilaterally, normal work of breathing. GI: soft, nontender, nondistended, + BS MS: no deformity or atrophy Skin: warm and dry, no rash Neuro:  Strength and sensation are intact Psych: euthymic mood, full affect. Today's encounter suggests the possibility of decreased memory.   EKG:  EKG is ordered today. The ekg reveals sinus bradycardia with normal overall tracing.   Recent Labs: 12/27/2013: HGB 14.7; Platelets 194    Lipid Panel    Component Value Date/Time   CHOL 139 05/08/2005 1216   TRIG 97 05/08/2005 1216   HDL 31* 05/08/2005 1216   CHOLHDL 4.5 05/08/2005 1216   VLDL 19 05/08/2005 1216   LDLCALC 89 05/08/2005 1216      Wt Readings from Last 3 Encounters:  09/26/14 117.209 kg (258 lb 6.4 oz)  12/27/13 115.304 kg (254 lb 3.2 oz)  07/21/13 116.574 kg (257 lb)      Other studies Reviewed: Additional studies/ records that were reviewed today include: Unable to find prior catheterization data. Prior discharge summaries suggested he has had bypass surgery, but this is incorrect.. The findings include last myocardial perfusion study revealed West risk in July 2015..    ASSESSMENT AND PLAN:  1. Carotid occlusive disease, right carotid is totally occluded No neurological complaints. Blind in his right eye.  2. Mixed hyperlipidemia On therapy.  3. Coronary artery disease without angina. Prior stent. Asymptomatic. Negative myocardial perfusion study less than a year ago. We'll perform LV assessment with echocardiogram.  4. Essential hypertension, benign Adequate control. Need to rule out LVH.  5. Obesity Progressive.   6. Decreased memory  Current medicines are reviewed at length with the patient today.  The patient has the following concerns regarding medicines: None.  The following changes/actions have been instituted:    2-D Doppler echocardiogram to  assess LV function  Bilateral carotid Doppler  Encouraged increase in aerobic activity  Cautioned to call if chest discomfort  Labs/ tests ordered today include:   No orders of the defined types were placed in this encounter.     Disposition:   FU with HS in 1 year  Signed, Mark Grooms, MD  09/26/2014 12:25 PM    [facility] [place], [place], [place]  [address] Phone: [phone]; Fax: [phone]

## 2014-09-26 ENCOUNTER — Ambulatory Visit (INDEPENDENT_AMBULATORY_CARE_PROVIDER_SITE_OTHER): Payer: PPO | Admitting: Interventional Cardiology

## 2014-09-26 ENCOUNTER — Encounter: Payer: Self-pay | Admitting: Interventional Cardiology

## 2014-09-26 VITALS — BP 138/70 | HR 59 | Ht 69.0 in | Wt 258.4 lb

## 2014-09-26 DIAGNOSIS — I2510 Atherosclerotic heart disease of native coronary artery without angina pectoris: Secondary | ICD-10-CM

## 2014-09-26 DIAGNOSIS — I252 Old myocardial infarction: Secondary | ICD-10-CM

## 2014-09-26 DIAGNOSIS — I10 Essential (primary) hypertension: Secondary | ICD-10-CM

## 2014-09-26 DIAGNOSIS — E782 Mixed hyperlipidemia: Secondary | ICD-10-CM

## 2014-09-26 DIAGNOSIS — C8290 Follicular lymphoma, unspecified, unspecified site: Secondary | ICD-10-CM | POA: Diagnosis not present

## 2014-09-26 DIAGNOSIS — I6521 Occlusion and stenosis of right carotid artery: Secondary | ICD-10-CM

## 2014-09-26 DIAGNOSIS — E669 Obesity, unspecified: Secondary | ICD-10-CM

## 2014-09-26 NOTE — Patient Instructions (Addendum)
Medication Instructions:  Your physician recommends that you continue on your current medications as directed. Please refer to the Current Medication list given to you today.     Labwork: None ordered    Testing/Procedures: Your physician has requested that you have an echocardiogram. Echocardiography is a painless test that uses sound waves to create images of your heart. It provides your doctor with information about the size and shape of your heart and how well your heart's chambers and valves are working. This procedure takes approximately one hour. There are no restrictions for this procedure.  Your physician has requested that you have a carotid duplex. This test is an ultrasound of the carotid arteries in your neck. It looks at blood flow through these arteries that supply the brain with blood. Allow one hour for this exam. There are no restrictions or special instructions. (To be scheduled in December 2016)     Follow-Up: Your physician wants you to follow-up in: 1 year with Dr.Smith You will receive a reminder letter in the mail two months in advance. If you don't receive a letter, please call our office to schedule the follow-up appointment.     Any Other Special Instructions Will Be Listed Below (If Applicable).

## 2014-09-27 ENCOUNTER — Other Ambulatory Visit: Payer: Self-pay

## 2014-09-27 DIAGNOSIS — I2510 Atherosclerotic heart disease of native coronary artery without angina pectoris: Secondary | ICD-10-CM

## 2014-09-29 ENCOUNTER — Other Ambulatory Visit: Payer: Self-pay

## 2014-09-29 ENCOUNTER — Ambulatory Visit (HOSPITAL_COMMUNITY): Payer: PPO | Attending: Cardiology

## 2014-09-29 DIAGNOSIS — Z6838 Body mass index (BMI) 38.0-38.9, adult: Secondary | ICD-10-CM | POA: Insufficient documentation

## 2014-09-29 DIAGNOSIS — I10 Essential (primary) hypertension: Secondary | ICD-10-CM | POA: Diagnosis not present

## 2014-09-29 DIAGNOSIS — E119 Type 2 diabetes mellitus without complications: Secondary | ICD-10-CM | POA: Insufficient documentation

## 2014-09-29 DIAGNOSIS — E785 Hyperlipidemia, unspecified: Secondary | ICD-10-CM | POA: Diagnosis not present

## 2014-09-29 DIAGNOSIS — E669 Obesity, unspecified: Secondary | ICD-10-CM | POA: Diagnosis not present

## 2014-09-29 DIAGNOSIS — I2510 Atherosclerotic heart disease of native coronary artery without angina pectoris: Secondary | ICD-10-CM | POA: Insufficient documentation

## 2014-09-30 ENCOUNTER — Telehealth: Payer: Self-pay

## 2014-09-30 NOTE — Telephone Encounter (Signed)
F/u   Pt returning [place] phone call.

## 2014-09-30 NOTE — Telephone Encounter (Signed)
Pt aware of echo results. Echocardiogram is normal Pt verbalized understanding

## 2014-09-30 NOTE — Telephone Encounter (Signed)
Called to give pt echo results.lmtcb 

## 2014-09-30 NOTE — Telephone Encounter (Signed)
-----   Message from Belva Crome, MD sent at 09/29/2014  6:24 PM EDT ----- Echocardiogram is normal

## 2014-12-25 ENCOUNTER — Other Ambulatory Visit: Payer: Self-pay | Admitting: Oncology

## 2014-12-25 DIAGNOSIS — C8203 Follicular lymphoma grade I, intra-abdominal lymph nodes: Secondary | ICD-10-CM

## 2014-12-26 ENCOUNTER — Ambulatory Visit (HOSPITAL_COMMUNITY)
Admission: RE | Admit: 2014-12-26 | Discharge: 2014-12-26 | Disposition: A | Discharge status: Home / Self Care | Payer: PPO | Source: Ambulatory Visit | Attending: Interventional Cardiology | Admitting: Interventional Cardiology

## 2014-12-26 DIAGNOSIS — E785 Hyperlipidemia, unspecified: Secondary | ICD-10-CM | POA: Insufficient documentation

## 2014-12-26 DIAGNOSIS — I10 Essential (primary) hypertension: Secondary | ICD-10-CM | POA: Insufficient documentation

## 2014-12-26 DIAGNOSIS — E119 Type 2 diabetes mellitus without complications: Secondary | ICD-10-CM | POA: Insufficient documentation

## 2014-12-26 DIAGNOSIS — I6523 Occlusion and stenosis of bilateral carotid arteries: Secondary | ICD-10-CM

## 2014-12-26 DIAGNOSIS — I6521 Occlusion and stenosis of right carotid artery: Secondary | ICD-10-CM | POA: Diagnosis present

## 2014-12-29 ENCOUNTER — Telehealth: Payer: Self-pay

## 2014-12-29 ENCOUNTER — Encounter: Payer: Self-pay | Admitting: Oncology

## 2014-12-29 ENCOUNTER — Ambulatory Visit (HOSPITAL_BASED_OUTPATIENT_CLINIC_OR_DEPARTMENT_OTHER): Payer: PPO | Admitting: Oncology

## 2014-12-29 ENCOUNTER — Ambulatory Visit (HOSPITAL_BASED_OUTPATIENT_CLINIC_OR_DEPARTMENT_OTHER): Payer: PPO

## 2014-12-29 VITALS — BP 156/72 | HR 70 | Temp 98.1°F | Resp 18 | Ht 69.0 in | Wt 249.8 lb

## 2014-12-29 DIAGNOSIS — Z85038 Personal history of other malignant neoplasm of large intestine: Secondary | ICD-10-CM

## 2014-12-29 DIAGNOSIS — C8209 Follicular lymphoma grade I, extranodal and solid organ sites: Secondary | ICD-10-CM

## 2014-12-29 DIAGNOSIS — Z23 Encounter for immunization: Secondary | ICD-10-CM

## 2014-12-29 DIAGNOSIS — C8203 Follicular lymphoma grade I, intra-abdominal lymph nodes: Secondary | ICD-10-CM

## 2014-12-29 DIAGNOSIS — E119 Type 2 diabetes mellitus without complications: Secondary | ICD-10-CM | POA: Diagnosis not present

## 2014-12-29 DIAGNOSIS — E669 Obesity, unspecified: Secondary | ICD-10-CM

## 2014-12-29 DIAGNOSIS — Z800 Family history of malignant neoplasm of digestive organs: Secondary | ICD-10-CM

## 2014-12-29 DIAGNOSIS — I6523 Occlusion and stenosis of bilateral carotid arteries: Secondary | ICD-10-CM

## 2014-12-29 DIAGNOSIS — Z862 Personal history of diseases of the blood and blood-forming organs and certain disorders involving the immune mechanism: Secondary | ICD-10-CM

## 2014-12-29 LAB — CBC WITH DIFFERENTIAL/PLATELET
BASO%: 0.5 % (ref 0.0–2.0)
Basophils Absolute: 0 10*3/uL (ref 0.0–0.1)
EOS%: 0.4 % (ref 0.0–7.0)
Eosinophils Absolute: 0 10*3/uL (ref 0.0–0.5)
HEMATOCRIT: 44.5 % (ref 38.4–49.9)
HGB: 14.1 g/dL (ref 13.0–17.1)
LYMPH#: 1.5 10*3/uL (ref 0.9–3.3)
LYMPH%: 18.6 % (ref 14.0–49.0)
MCH: 28.5 pg (ref 27.2–33.4)
MCHC: 31.7 g/dL — ABNORMAL LOW (ref 32.0–36.0)
MCV: 90.1 fL (ref 79.3–98.0)
MONO#: 0.8 10*3/uL (ref 0.1–0.9)
MONO%: 9.8 % (ref 0.0–14.0)
NEUT%: 70.7 % (ref 39.0–75.0)
NEUTROS ABS: 5.5 10*3/uL (ref 1.5–6.5)
PLATELETS: 219 10*3/uL (ref 140–400)
RBC: 4.94 10*6/uL (ref 4.20–5.82)
RDW: 13.9 % (ref 11.0–14.6)
WBC: 7.8 10*3/uL (ref 4.0–10.3)

## 2014-12-29 LAB — COMPREHENSIVE METABOLIC PANEL
ALT: 20 U/L (ref 0–55)
ANION GAP: 9 meq/L (ref 3–11)
AST: 17 U/L (ref 5–34)
Albumin: 3.7 g/dL (ref 3.5–5.0)
Alkaline Phosphatase: 64 U/L (ref 40–150)
BUN: 15.7 mg/dL (ref 7.0–26.0)
CO2: 22 meq/L (ref 22–29)
CREATININE: 1 mg/dL (ref 0.7–1.3)
Calcium: 9.2 mg/dL (ref 8.4–10.4)
Chloride: 109 mEq/L (ref 98–109)
EGFR: 74 mL/min/{1.73_m2} — ABNORMAL LOW (ref >90)
Glucose: 191 mg/dl — ABNORMAL HIGH (ref 70–140)
Potassium: 4.9 mEq/L (ref 3.5–5.1)
Sodium: 140 mEq/L (ref 136–145)
TOTAL PROTEIN: 7 g/dL (ref 6.4–8.3)
Total Bilirubin: 0.48 mg/dL (ref 0.20–1.20)

## 2014-12-29 LAB — LACTATE DEHYDROGENASE: LDH: 137 U/L (ref 125–245)

## 2014-12-29 MED ORDER — INFLUENZA VAC SPLIT QUAD 0.5 ML IM SUSY
0.5000 mL | PREFILLED_SYRINGE | INTRAMUSCULAR | Status: AC
  Administered 2014-12-29: 0.5 mL via INTRAMUSCULAR
  Filled 2014-12-29: qty 0.5

## 2014-12-29 NOTE — Telephone Encounter (Signed)
Follow up      Patient returning call back to nurse - asking to call back around 10 am

## 2014-12-29 NOTE — Telephone Encounter (Signed)
Called to give pt carotid results. lmtcb. 

## 2014-12-29 NOTE — Telephone Encounter (Signed)
lmom.Stable study compared to prior. There is total occlusion of the right internal carotid artery. Left internal carotid is less than 40% obstructed. Repeat the study in one year

## 2014-12-29 NOTE — Progress Notes (Signed)
OFFICE PROGRESS NOTE   December 29, 2014   Physicians:Karrar [place]  INTERVAL HISTORY:   Patient is seen, alone for visit, in scheduled yearly follow up of iron deficiency anemia related to bleeding from large inflammatoyr polyp at colonic anastomosis (removed [phone]), follicular lymphoma continuing observation and history of colon cancer.  Last imaging was CT AP 06-2013 Colonoscopy by Dr Amedeo Plenty [phone], per patient "looked fine, a few polyps" and for repeat in 2 years. Will request report for this EMR.   Patient was doing well until recent bronchitis which lasted 3 weeks, now essentially resolved. He was seen for the bronchitis by PCP, given prescription for antibiotic "if necessary" which he did not use. He lost 8 lbs with the bronchitis, appetite good now but hopes to continue to lose some weight. He has no fever, no SOB, no chest pain now. He has had no other infectious illness in months. He denies change in bowels, any blood in stools or other bleeding.He stopped oral iron 12-2013 as instructed. No abdominal pain. Energy was at baseline until recent bronchitis.  No recent cardiac symptoms. No LE swellng. No noted adenopathy.   Flu vaccine 12-29-14  ONCOLOGIC HISTORY 1) Synchronous B2 and C2 colon carcinomas in 1992, treated with subtotal colectomy and adjuvant 5FU with levamisole, not known recurrent. There was no evidence of new or locally recurrent colon cancer at colonoscopy 06-25-2012.  2) Low grade, follicular NHL in Nov 0932 at exploratory laparotomy with biopsy of abdominal adenopathy which had been found incidentally on CT done because of the colon cancer history. He has not required any treatment of the NHL.  3) Iron deficiency Jan 2012; treated with oral iron and had IV feraheme at full dose in Feb 2013, Sept 2013 and May 2014. Last CT AP and CXR in [place] system was 06-26-2012, with spleen normal, scattered stable mesenteric and retroperitoneal  adenopathy and unenhanced liver not remarkable . He had villous mass in distal ileum which was malignant on biopsies and appeared stable by Dr Amedeo Plenty' exam in June 2013.  Dr Amedeo Plenty was able to resect at procedure 6-219-14. Pathology from [facility] [phone] inflammatory polyp 1.6 x 1.5 x 1.4 cm with associated surface erosion and granulation tissue, no high grade dysplasia or malignancy.No GI bleeding since resection of the inflammatory polyp and oral iron DCd 12-2013.   Objective:  Vital signs in last 24 hours:  BP 156/72 mmHg  Pulse 70  Temp(Src) 98.1 F (36.7 C) (Oral)  Resp 18  Ht 5' 9"  (1.753 m)  Wt 249 lb 12.8 oz (113.309 kg)  BMI 36.87 kg/m2  SpO2 98% Weight down 4 lbs from 12-2013 but total 8 lbs per patient. Obese. Slightly nasally congested, no cough, does not appear ill Alert, oriented and appropriate. Ambulatory without difficulty. Very pleasant as always   HEENT:PERRL, sclerae not icteric. Oral mucosa moist without lesions, posterior pharynx clear.  Neck supple. No JVD.  Lymphatics:no cervical,supraclavicular, axillary adenopathy Resp: clear to auscultation bilaterally and normal percussion bilaterally Cardio: regular rate and rhythm. No gallop. GI: abdomen obese, soft, nontender, not distended, no mass or organomegaly. Normally active bowel sounds. Surgical incision not remarkable. Musculoskeletal/ Extremities: without pitting edema, cords, tenderness Neuro: nonfocal PSYCH appropriate mood and affect Skin without rash, ecchymosis, petechiae   Lab Results:  Results for orders placed or performed in visit on 12/29/14  CBC with Differential  Result Value Ref Range   WBC 7.8 4.0 - 10.3 10e3/uL   NEUT# 5.5  1.5 - 6.5 10e3/uL   HGB 14.1 13.0 - 17.1 g/dL   HCT 44.5 38.4 - 49.9 %   Platelets 219 140 - 400 10e3/uL   MCV 90.1 79.3 - 98.0 fL   MCH 28.5 27.2 - 33.4 pg   MCHC 31.7 (L) 32.0 - 36.0 g/dL   RBC 4.94 4.20 - 5.82 10e6/uL   RDW 13.9 11.0 - 14.6 %    lymph# 1.5 0.9 - 3.3 10e3/uL   MONO# 0.8 0.1 - 0.9 10e3/uL   Eosinophils Absolute 0.0 0.0 - 0.5 10e3/uL   Basophils Absolute 0.0 0.0 - 0.1 10e3/uL   NEUT% 70.7 39.0 - 75.0 %   LYMPH% 18.6 14.0 - 49.0 %   MONO% 9.8 0.0 - 14.0 %   EOS% 0.4 0.0 - 7.0 %   BASO% 0.5 0.0 - 2.0 %  Comprehensive metabolic panel  Result Value Ref Range   Sodium 140 136 - 145 mEq/L   Potassium 4.9 3.5 - 5.1 mEq/L   Chloride 109 98 - 109 mEq/L   CO2 22 22 - 29 mEq/L   Glucose 191 (H) 70 - 140 mg/dl   BUN 15.7 7.0 - 26.0 mg/dL   Creatinine 1.0 0.7 - 1.3 mg/dL   Total Bilirubin 0.48 0.20 - 1.20 mg/dL   Alkaline Phosphatase 64 40 - 150 U/L   AST 17 5 - 34 U/L   ALT 20 0 - 55 U/L   Total Protein 7.0 6.4 - 8.3 g/dL   Albumin 3.7 3.5 - 5.0 g/dL   Calcium 9.2 8.4 - 10.4 mg/dL   Anion Gap 9 3 - 11 mEq/L   EGFR 74 (L) >90 ml/min/1.73 m2  Lactate dehydrogenase (LDH) - [facility]  Result Value Ref Range   LDH 137 125 - 245 U/L     Studies/Results:  No results found.  Medications: I have reviewed the patient's current medications.  DISCUSSION: hemoglobin and MCV normal, no concern for recurrent iron deficiency on these labs. WBC and differential not remarkable, LDH not elevated and no adenopathy on exam. Expect recent bronchitis was not due to an immunocompromised state from the follicular lymphoma, tho if recurrent infections would evaluate that further. Appreciate Dr Amedeo Plenty following him so closely, no clinical concern for colon cancer now. Patient prefers yearly follow up at this office, knows that he can be seen sooner if he or other MDs have concerns.  Assessment/Plan:   1.follicular NHL: no obvious progression by labs and exam. Doubt recent bronchitis related, tho did last 3 weeks so would consider further evaluation if recurrent infections 2. Iron deficiency anemia:resolved since resection of the inflammatory anastomotic colon polyp. Continue off oral iron 3.remote history of synchronous colon cancers, not  known active. Up to date on colonoscopies by Dr Amedeo Plenty  4.diabetes: some success now with weight loss 5.obesity: discussed exercise. Some success now with weight loss 6.CAD, previous MI  7.carotid artery disease with vision loss OD  8.degenerative arthritis left knee 9. Flu vaccine 12-29-14 10. Recent bronchitis: lasted 3 weeks but resolved without antibiotic. See above re recurrent infections  All questions answered. Cc Drs Sherri Rad, H.Smith. Time spent 20 min including >50% counseling and coordination of care.  Ermon Sagan P, MD   12/29/2014, 10:00 AM

## 2014-12-29 NOTE — Telephone Encounter (Signed)
-----   Message from Belva Crome, MD sent at 12/28/2014  5:51 PM EST ----- Stable study compared to prior. There is total occlusion of the right internal carotid artery. Left internal carotid is less than 40% obstructed. Repeat the study in one year

## 2014-12-30 ENCOUNTER — Telehealth: Payer: Self-pay | Admitting: Oncology

## 2014-12-30 NOTE — Telephone Encounter (Signed)
Patient aware of 69yr follow up

## 2014-12-31 DIAGNOSIS — C8203 Follicular lymphoma grade I, intra-abdominal lymph nodes: Secondary | ICD-10-CM | POA: Insufficient documentation

## 2014-12-31 DIAGNOSIS — Z862 Personal history of diseases of the blood and blood-forming organs and certain disorders involving the immune mechanism: Secondary | ICD-10-CM | POA: Insufficient documentation

## 2015-01-05 ENCOUNTER — Emergency Department (HOSPITAL_COMMUNITY)
Admission: EM | Admit: 2015-01-05 | Discharge: 2015-01-05 | Disposition: A | Discharge status: Home / Self Care | Payer: PPO | Attending: Emergency Medicine | Admitting: Emergency Medicine

## 2015-01-05 ENCOUNTER — Encounter (HOSPITAL_COMMUNITY): Payer: Self-pay

## 2015-01-05 DIAGNOSIS — Z87442 Personal history of urinary calculi: Secondary | ICD-10-CM | POA: Diagnosis not present

## 2015-01-05 DIAGNOSIS — Z862 Personal history of diseases of the blood and blood-forming organs and certain disorders involving the immune mechanism: Secondary | ICD-10-CM | POA: Insufficient documentation

## 2015-01-05 DIAGNOSIS — Z79899 Other long term (current) drug therapy: Secondary | ICD-10-CM | POA: Insufficient documentation

## 2015-01-05 DIAGNOSIS — E785 Hyperlipidemia, unspecified: Secondary | ICD-10-CM | POA: Insufficient documentation

## 2015-01-05 DIAGNOSIS — J45909 Unspecified asthma, uncomplicated: Secondary | ICD-10-CM | POA: Insufficient documentation

## 2015-01-05 DIAGNOSIS — Z7902 Long term (current) use of antithrombotics/antiplatelets: Secondary | ICD-10-CM | POA: Diagnosis not present

## 2015-01-05 DIAGNOSIS — E119 Type 2 diabetes mellitus without complications: Secondary | ICD-10-CM | POA: Insufficient documentation

## 2015-01-05 DIAGNOSIS — I10 Essential (primary) hypertension: Secondary | ICD-10-CM | POA: Insufficient documentation

## 2015-01-05 DIAGNOSIS — M1990 Unspecified osteoarthritis, unspecified site: Secondary | ICD-10-CM | POA: Insufficient documentation

## 2015-01-05 DIAGNOSIS — I252 Old myocardial infarction: Secondary | ICD-10-CM | POA: Diagnosis not present

## 2015-01-05 DIAGNOSIS — Z7982 Long term (current) use of aspirin: Secondary | ICD-10-CM | POA: Insufficient documentation

## 2015-01-05 DIAGNOSIS — K625 Hemorrhage of anus and rectum: Secondary | ICD-10-CM | POA: Diagnosis not present

## 2015-01-05 DIAGNOSIS — Z87891 Personal history of nicotine dependence: Secondary | ICD-10-CM | POA: Insufficient documentation

## 2015-01-05 DIAGNOSIS — Z85038 Personal history of other malignant neoplasm of large intestine: Secondary | ICD-10-CM | POA: Insufficient documentation

## 2015-01-05 DIAGNOSIS — I2510 Atherosclerotic heart disease of native coronary artery without angina pectoris: Secondary | ICD-10-CM | POA: Diagnosis not present

## 2015-01-05 DIAGNOSIS — Z9861 Coronary angioplasty status: Secondary | ICD-10-CM | POA: Diagnosis not present

## 2015-01-05 LAB — COMPREHENSIVE METABOLIC PANEL
ALK PHOS: 61 U/L (ref 38–126)
ALT: 27 U/L (ref 17–63)
ANION GAP: 12 (ref 5–15)
AST: 25 U/L (ref 15–41)
Albumin: 4.1 g/dL (ref 3.5–5.0)
BUN: 20 mg/dL (ref 6–20)
CALCIUM: 9.2 mg/dL (ref 8.9–10.3)
CO2: 23 mmol/L (ref 22–32)
Chloride: 105 mmol/L (ref 101–111)
Creatinine, Ser: 0.88 mg/dL (ref 0.61–1.24)
GFR calc non Af Amer: >60 mL/min (ref >60)
Glucose, Bld: 183 mg/dL — ABNORMAL HIGH (ref 65–99)
Potassium: 4.7 mmol/L (ref 3.5–5.1)
Sodium: 140 mmol/L (ref 135–145)
TOTAL PROTEIN: 7 g/dL (ref 6.5–8.1)
Total Bilirubin: 0.7 mg/dL (ref 0.3–1.2)

## 2015-01-05 LAB — CBC
HCT: 42.4 % (ref 39.0–52.0)
HEMOGLOBIN: 13.2 g/dL (ref 13.0–17.0)
MCH: 28.4 pg (ref 26.0–34.0)
MCHC: 31.1 g/dL (ref 30.0–36.0)
MCV: 91.4 fL (ref 78.0–100.0)
Platelets: 199 10*3/uL (ref 150–400)
RBC: 4.64 MIL/uL (ref 4.22–5.81)
RDW: 14.2 % (ref 11.5–15.5)
WBC: 5.6 10*3/uL (ref 4.0–10.5)

## 2015-01-05 LAB — TYPE AND SCREEN
ABO/RH(D): A POS
ANTIBODY SCREEN: NEGATIVE

## 2015-01-05 NOTE — Discharge Instructions (Signed)
°  Hold your Plavix and aspirin until cleared by Dr. Amedeo Plenty  Gastrointestinal Bleeding Gastrointestinal (GI) bleeding means there is bleeding somewhere along the digestive tract, between the mouth and anus. CAUSES  There are many different problems that can cause GI bleeding. Possible causes include:  Esophagitis. This is inflammation, irritation, or swelling of the esophagus.  Hemorrhoids.These are veins that are full of blood (engorged) in the rectum. They cause pain, inflammation, and may bleed.  Anal fissures.These are areas of painful tearing which may bleed. They are often caused by passing hard stool.  Diverticulosis.These are pouches that form on the colon over time, with age, and may bleed significantly.  Diverticulitis.This is inflammation in areas with diverticulosis. It can cause pain, fever, and bloody stools, although bleeding is rare.  Polyps and cancer. Colon cancer often starts out as precancerous polyps.  Gastritis and ulcers.Bleeding from the upper gastrointestinal tract (near the stomach) may travel through the intestines and produce black, sometimes tarry, often bad smelling stools. In certain cases, if the bleeding is fast enough, the stools may not be black, but red. This condition may be life-threatening. SYMPTOMS   Vomiting bright red blood or material that looks like coffee grounds.  Bloody, black, or tarry stools. DIAGNOSIS  Your caregiver may diagnose your condition by taking your history and performing a physical exam. More tests may be needed, including:  X-rays and other imaging tests.  Esophagogastroduodenoscopy (EGD). This test uses a flexible, lighted tube to look at your esophagus, stomach, and small intestine.  Colonoscopy. This test uses a flexible, lighted tube to look at your colon. TREATMENT  Treatment depends on the cause of your bleeding.   For bleeding from the esophagus, stomach, small intestine, or colon, the caregiver doing your EGD  or colonoscopy may be able to stop the bleeding as part of the procedure.  Inflammation or infection of the colon can be treated with medicines.  Many rectal problems can be treated with creams, suppositories, or warm baths.  Surgery is sometimes needed.  Blood transfusions are sometimes needed if you have lost a lot of blood. If bleeding is slow, you may be allowed to go home. If there is a lot of bleeding, you will need to stay in the hospital for observation. HOME CARE INSTRUCTIONS   Take any medicines exactly as prescribed.  Keep your stools soft by eating foods that are high in fiber. These foods include whole grains, legumes, fruits, and vegetables. Prunes (1 to 3 a day) work well for many people.  Drink enough fluids to keep your urine clear or pale yellow. SEEK IMMEDIATE MEDICAL CARE IF:   Your bleeding increases.  You feel lightheaded, weak, or you faint.  You have severe cramps in your back or abdomen.  You pass large blood clots in your stool.  Your problems are getting worse. MAKE SURE YOU:   Understand these instructions.  Will watch your condition.  Will get help right away if you are not doing well or get worse.   This information is not intended to replace advice given to you by your health care provider. Make sure you discuss any questions you have with your health care provider.   Document Released: 12/22/1999 Document Revised: 12/11/2011 Document Reviewed: 06/13/2014 Elsevier Interactive Patient Education [employer].

## 2015-01-05 NOTE — ED Provider Notes (Signed)
CSN: [id number]     Arrival date & time 01/05/15  0848 History   First MD Initiated Contact with Patient 01/05/15 0901     Chief Complaint  Patient presents with  . Rectal Bleeding      HPI Patient had history of colon cancer in 1992.  Been doing well since then.  Had a negative colonoscopy done last September.  Starting yesterday patient had 3-4 large bloody bowel movements.  He did take pictures which he showed me.  Patient's had no pain.  No dizziness or near syncope.  Denies shortness of breath.  Try to contact his gastroenterologist but he was off today. Past Medical History  Diagnosis Date  . Anemia, iron deficiency 01/26/2011  . Cancer (HCC)     nhl, colon ca  . Diabetes mellitus   . Arthritis   . Asthma   . Hyperlipidemia   . Hypertension   . Myocardial infarction ([place])   . Carotid artery occlusion     right occlusion of carotid with moderate left plaque  . Coronary artery disease August 2005    MI with stent  . Kidney stones 05/2010    lithotripsy   Past Surgical History  Procedure Laterality Date  . Coronary stent placement    . Hernia repair    . Colon surgery    . Colonoscopy    . Repair of complex traction retinal detachment     Family History  Problem Relation Age of Onset  . Cancer Mother     ovarian  . Cancer Father     lymphoma  . Cancer Brother     colon  . Cancer Maternal Aunt     colon  . Healthy Sister    Social History  Substance Use Topics  . Smoking status: Former Smoker    Quit date: 01/08/1983  . Smokeless tobacco: Never Used  . Alcohol Use: No    Review of Systems All other systems reviewed and are negative   Allergies  Iodine  Home Medications   Prior to Admission medications   Medication Sig Start Date End Date Taking? Authorizing Provider  aspirin 81 MG tablet Take 81 mg by mouth daily.     Yes Historical Provider, MD  atorvastatin (LIPITOR) 40 MG tablet Take 40 mg by mouth daily.     Yes Historical Provider, MD   clopidogrel (PLAVIX) 75 MG tablet Take 75 mg by mouth daily.     Yes Historical Provider, MD  Coenzyme Q10 (CO Q 10 PO) Take 1 capsule by mouth daily.    Yes Historical Provider, MD  glucosamine-chondroitin 500-400 MG tablet Take 1 tablet by mouth daily.    Yes Historical Provider, MD  lisinopril (PRINIVIL,ZESTRIL) 5 MG tablet Take 5 mg by mouth daily.  01/06/12  Yes Historical Provider, MD  Lutein 10 MG TABS Take 1 tablet by mouth daily.    Yes Historical Provider, MD  LYCOPENE PO Take 1 tablet by mouth daily.    Yes Historical Provider, MD  metFORMIN (GLUCOPHAGE) 500 MG tablet Take 1,000 mg by mouth 2 (two) times daily with a meal.   Yes Historical Provider, MD  metoprolol (LOPRESSOR) 50 MG tablet Take 25 mg by mouth 2 (two) times daily.   Yes Historical Provider, MD  Omega-3 Fatty Acids (FISH OIL) 1000 MG CAPS Take 1 capsule by mouth daily.   Yes Historical Provider, MD  pantoprazole (PROTONIX) 40 MG tablet Take 40 mg by mouth daily.  06/30/13  Yes Historical Provider,  MD  sitaGLIPtin (JANUVIA) 100 MG tablet Take 100 mg by mouth daily.     Yes Historical Provider, MD   BP 159/85 mmHg  Pulse 73  Temp(Src) 97.9 F (36.6 C) (Oral)  Resp 20  SpO2 98% Physical Exam Physical Exam  Nursing note and vitals reviewed. Constitutional: He is oriented to person, place, and time. He appears well-developed and well-nourished. No distress.  HENT:  Head: Normocephalic and atraumatic.  Eyes: Pupils are equal, round, and reactive to light.  Neck: Normal range of motion.  Cardiovascular: Normal rate and intact distal pulses.   Pulmonary/Chest: No respiratory distress.  Abdominal: Normal appearance. He exhibits no distension.  Musculoskeletal: Normal range of motion.  Neurological: He is alert and oriented to person, place, and time. No cranial nerve deficit.  Skin: Skin is warm and dry. No rash noted.  Psychiatric: He has a normal mood and affect. His behavior is normal.   ED Course  Procedures  (including critical care time)  I reviewed photos from the patient at home.  There are 3 large bloody bowel movements that he's had since last night which he took pictures of. Labs Review Labs Reviewed  COMPREHENSIVE METABOLIC PANEL - Abnormal; Notable for the following:    Glucose, Bld 183 (*)    All other components within normal limits  CBC  TYPE AND SCREEN  ABO/RH    I discussed the case with Dr. Paulita Fujita who is covering for Dr. Amedeo Plenty.  Patient appears stable for discharge and will be followed up in the office this week.  Patient was told to return to emergency room should he develop worsening bleeding or if he develops dizziness.  MDM   Final diagnoses:  Rectal bleeding        Leonard Schwartz, MD 01/05/15 1125

## 2015-01-05 NOTE — ED Notes (Signed)
Pt with Hx of colon cancer and removal of much of colon c/o large volumes of bright red rectal bleeding onset yesterday 2000. Pt on multiple blood thinners.

## 2015-01-05 NOTE — ED Notes (Signed)
Patient d/c'd self care with friend..  F/U and medications discussed.  Patient verbalized understanding.

## 2015-01-06 LAB — ABO/RH: ABO/RH(D): A POS

## 2015-01-10 DIAGNOSIS — K625 Hemorrhage of anus and rectum: Secondary | ICD-10-CM | POA: Diagnosis not present

## 2015-01-13 DIAGNOSIS — K625 Hemorrhage of anus and rectum: Secondary | ICD-10-CM | POA: Diagnosis not present

## 2015-05-08 ENCOUNTER — Emergency Department (HOSPITAL_COMMUNITY): Payer: PPO | Admitting: Anesthesiology

## 2015-05-08 ENCOUNTER — Other Ambulatory Visit: Payer: Self-pay | Admitting: Orthopedic Surgery

## 2015-05-08 ENCOUNTER — Ambulatory Visit (HOSPITAL_COMMUNITY)
Admission: EM | Admit: 2015-05-08 | Discharge: 2015-05-08 | Disposition: A | Discharge status: Home / Self Care | Payer: PPO | Attending: Emergency Medicine | Admitting: Emergency Medicine

## 2015-05-08 ENCOUNTER — Encounter (HOSPITAL_COMMUNITY)
Admission: EM | Disposition: A | Discharge status: Home / Self Care | Payer: Self-pay | Source: Home / Self Care | Attending: Emergency Medicine

## 2015-05-08 ENCOUNTER — Encounter (HOSPITAL_COMMUNITY): Payer: Self-pay | Admitting: Emergency Medicine

## 2015-05-08 ENCOUNTER — Emergency Department (HOSPITAL_COMMUNITY): Payer: PPO

## 2015-05-08 DIAGNOSIS — S52612A Displaced fracture of left ulna styloid process, initial encounter for closed fracture: Secondary | ICD-10-CM | POA: Diagnosis not present

## 2015-05-08 DIAGNOSIS — E785 Hyperlipidemia, unspecified: Secondary | ICD-10-CM | POA: Diagnosis not present

## 2015-05-08 DIAGNOSIS — Z7982 Long term (current) use of aspirin: Secondary | ICD-10-CM | POA: Diagnosis not present

## 2015-05-08 DIAGNOSIS — E119 Type 2 diabetes mellitus without complications: Secondary | ICD-10-CM | POA: Diagnosis not present

## 2015-05-08 DIAGNOSIS — S52502A Unspecified fracture of the lower end of left radius, initial encounter for closed fracture: Secondary | ICD-10-CM

## 2015-05-08 DIAGNOSIS — Z955 Presence of coronary angioplasty implant and graft: Secondary | ICD-10-CM | POA: Insufficient documentation

## 2015-05-08 DIAGNOSIS — S8002XA Contusion of left knee, initial encounter: Secondary | ICD-10-CM | POA: Diagnosis not present

## 2015-05-08 DIAGNOSIS — Z87891 Personal history of nicotine dependence: Secondary | ICD-10-CM | POA: Insufficient documentation

## 2015-05-08 DIAGNOSIS — S52572A Other intraarticular fracture of lower end of left radius, initial encounter for closed fracture: Secondary | ICD-10-CM | POA: Diagnosis not present

## 2015-05-08 DIAGNOSIS — S59202A Unspecified physeal fracture of lower end of radius, left arm, initial encounter for closed fracture: Secondary | ICD-10-CM | POA: Insufficient documentation

## 2015-05-08 DIAGNOSIS — W19XXXA Unspecified fall, initial encounter: Secondary | ICD-10-CM | POA: Diagnosis not present

## 2015-05-08 DIAGNOSIS — I10 Essential (primary) hypertension: Secondary | ICD-10-CM | POA: Diagnosis not present

## 2015-05-08 DIAGNOSIS — T148 Other injury of unspecified body region: Secondary | ICD-10-CM | POA: Diagnosis not present

## 2015-05-08 DIAGNOSIS — G5602 Carpal tunnel syndrome, left upper limb: Secondary | ICD-10-CM | POA: Diagnosis not present

## 2015-05-08 DIAGNOSIS — M25562 Pain in left knee: Secondary | ICD-10-CM | POA: Diagnosis not present

## 2015-05-08 DIAGNOSIS — Z79899 Other long term (current) drug therapy: Secondary | ICD-10-CM | POA: Insufficient documentation

## 2015-05-08 DIAGNOSIS — S62109A Fracture of unspecified carpal bone, unspecified wrist, initial encounter for closed fracture: Secondary | ICD-10-CM | POA: Diagnosis not present

## 2015-05-08 DIAGNOSIS — Z6836 Body mass index (BMI) 36.0-36.9, adult: Secondary | ICD-10-CM | POA: Diagnosis not present

## 2015-05-08 DIAGNOSIS — I252 Old myocardial infarction: Secondary | ICD-10-CM | POA: Diagnosis not present

## 2015-05-08 DIAGNOSIS — R200 Anesthesia of skin: Secondary | ICD-10-CM | POA: Insufficient documentation

## 2015-05-08 DIAGNOSIS — Z7902 Long term (current) use of antithrombotics/antiplatelets: Secondary | ICD-10-CM | POA: Diagnosis not present

## 2015-05-08 DIAGNOSIS — G8918 Other acute postprocedural pain: Secondary | ICD-10-CM | POA: Diagnosis not present

## 2015-05-08 DIAGNOSIS — W010XXA Fall on same level from slipping, tripping and stumbling without subsequent striking against object, initial encounter: Secondary | ICD-10-CM | POA: Insufficient documentation

## 2015-05-08 DIAGNOSIS — Z7984 Long term (current) use of oral hypoglycemic drugs: Secondary | ICD-10-CM | POA: Insufficient documentation

## 2015-05-08 DIAGNOSIS — S52622A Torus fracture of lower end of left ulna, initial encounter for closed fracture: Secondary | ICD-10-CM | POA: Diagnosis not present

## 2015-05-08 DIAGNOSIS — I2510 Atherosclerotic heart disease of native coronary artery without angina pectoris: Secondary | ICD-10-CM | POA: Insufficient documentation

## 2015-05-08 DIAGNOSIS — S8992XA Unspecified injury of left lower leg, initial encounter: Secondary | ICD-10-CM | POA: Diagnosis not present

## 2015-05-08 DIAGNOSIS — S4992XA Unspecified injury of left shoulder and upper arm, initial encounter: Secondary | ICD-10-CM | POA: Diagnosis not present

## 2015-05-08 HISTORY — PX: CARPAL TUNNEL RELEASE: SHX101

## 2015-05-08 HISTORY — PX: OPEN REDUCTION INTERNAL FIXATION (ORIF) DISTAL RADIAL FRACTURE: SHX5989

## 2015-05-08 LAB — GLUCOSE, CAPILLARY
Glucose-Capillary: 148 mg/dL — ABNORMAL HIGH (ref 65–99)
Glucose-Capillary: 135 mg/dL — ABNORMAL HIGH (ref 65–99)
Glucose-Capillary: 141 mg/dL — ABNORMAL HIGH (ref 65–99)

## 2015-05-08 SURGERY — OPEN REDUCTION INTERNAL FIXATION (ORIF) DISTAL RADIUS FRACTURE
Anesthesia: Regional | Site: Wrist | Laterality: Left

## 2015-05-08 MED ORDER — ONDANSETRON HCL 4 MG/2ML IJ SOLN: 4.0000 mg | Freq: Once | INTRAMUSCULAR | Status: DC | PRN

## 2015-05-08 MED ORDER — HYDROMORPHONE HCL 1 MG/ML IJ SOLN
0.5000 mg | Freq: Once | INTRAMUSCULAR | Status: AC | PRN
  Administered 2015-05-08: 0.5 mg via INTRAVENOUS
  Filled 2015-05-08: qty 1

## 2015-05-08 MED ORDER — EPHEDRINE 5 MG/ML INJ
INTRAVENOUS | Status: AC
Start: 2015-05-08 — End: 2015-05-08
  Filled 2015-05-08: qty 10

## 2015-05-08 MED ORDER — MORPHINE SULFATE (PF) 4 MG/ML IV SOLN
4.0000 mg | Freq: Once | INTRAVENOUS | Status: AC
  Administered 2015-05-08: 4 mg via INTRAVENOUS
  Filled 2015-05-08: qty 1

## 2015-05-08 MED ORDER — FENTANYL CITRATE (PF) 250 MCG/5ML IJ SOLN
INTRAMUSCULAR | Status: AC
  Filled 2015-05-08: qty 5

## 2015-05-08 MED ORDER — DEXTROSE-NACL 5-0.45 % IV SOLN: INTRAVENOUS | Status: DC

## 2015-05-08 MED ORDER — PROPOFOL 10 MG/ML IV BOLUS
INTRAVENOUS | Status: DC | PRN
  Administered 2015-05-08: 180 mg via INTRAVENOUS

## 2015-05-08 MED ORDER — HYDROMORPHONE HCL 1 MG/ML IJ SOLN
0.5000 mg | Freq: Once | INTRAMUSCULAR | Status: AC
  Administered 2015-05-08: 0.5 mg via INTRAVENOUS
  Filled 2015-05-08: qty 1

## 2015-05-08 MED ORDER — HYDROMORPHONE HCL 1 MG/ML IJ SOLN
1.0000 mg | Freq: Once | INTRAMUSCULAR | Status: AC
  Administered 2015-05-08: 1 mg via INTRAVENOUS
  Filled 2015-05-08: qty 1

## 2015-05-08 MED ORDER — LIDOCAINE HCL (CARDIAC) 20 MG/ML IV SOLN
INTRAVENOUS | Status: DC | PRN
  Administered 2015-05-08: 40 mg via INTRAVENOUS

## 2015-05-08 MED ORDER — ONDANSETRON HCL 4 MG/2ML IJ SOLN
INTRAMUSCULAR | Status: DC | PRN
  Administered 2015-05-08: 4 mg via INTRAVENOUS

## 2015-05-08 MED ORDER — ROCURONIUM BROMIDE 50 MG/5ML IV SOLN
INTRAVENOUS | Status: AC
  Filled 2015-05-08: qty 1

## 2015-05-08 MED ORDER — OXYCODONE HCL 5 MG PO TABS
5.0000 mg | ORAL_TABLET | Freq: Once | ORAL | Status: DC | PRN
  Filled 2015-05-08: qty 1

## 2015-05-08 MED ORDER — BUPIVACAINE HCL (PF) 0.25 % IJ SOLN
INTRAMUSCULAR | Status: AC
  Filled 2015-05-08: qty 30

## 2015-05-08 MED ORDER — EPHEDRINE SULFATE 50 MG/ML IJ SOLN
INTRAMUSCULAR | Status: DC | PRN
  Administered 2015-05-08 (×2): 10 mg via INTRAVENOUS

## 2015-05-08 MED ORDER — CHLORHEXIDINE GLUCONATE 4 % EX LIQD: 60.0000 mL | Freq: Once | CUTANEOUS | Status: DC

## 2015-05-08 MED ORDER — HYDROCODONE-ACETAMINOPHEN 7.5-325 MG PO TABS: 1.0000 | ORAL_TABLET | Freq: Once | ORAL | Status: DC | PRN

## 2015-05-08 MED ORDER — PHENYLEPHRINE HCL 10 MG/ML IJ SOLN
INTRAMUSCULAR | Status: DC | PRN
  Administered 2015-05-08 (×2): 120 ug via INTRAVENOUS
  Administered 2015-05-08: 160 ug via INTRAVENOUS

## 2015-05-08 MED ORDER — OXYCODONE-ACETAMINOPHEN 5-325 MG PO TABS: 1.0000 | ORAL_TABLET | ORAL | Status: DC | PRN

## 2015-05-08 MED ORDER — PROPOFOL 10 MG/ML IV BOLUS
INTRAVENOUS | Status: AC
  Filled 2015-05-08: qty 20

## 2015-05-08 MED ORDER — LIDOCAINE 2% (20 MG/ML) 5 ML SYRINGE
INTRAMUSCULAR | Status: AC
  Filled 2015-05-08: qty 5

## 2015-05-08 MED ORDER — BUPIVACAINE HCL (PF) 0.25 % IJ SOLN
INTRAMUSCULAR | Status: DC | PRN
  Administered 2015-05-08: 30 mL

## 2015-05-08 MED ORDER — MIDAZOLAM HCL 2 MG/2ML IJ SOLN
INTRAMUSCULAR | Status: AC
  Filled 2015-05-08: qty 2

## 2015-05-08 MED ORDER — ONDANSETRON HCL 4 MG/2ML IJ SOLN
INTRAMUSCULAR | Status: AC
  Filled 2015-05-08: qty 2

## 2015-05-08 MED ORDER — FENTANYL CITRATE (PF) 100 MCG/2ML IJ SOLN
INTRAMUSCULAR | Status: DC | PRN
  Administered 2015-05-08: 50 ug via INTRAVENOUS

## 2015-05-08 MED ORDER — ONDANSETRON HCL 4 MG/2ML IJ SOLN
4.0000 mg | Freq: Once | INTRAMUSCULAR | Status: AC
  Administered 2015-05-08: 4 mg via INTRAVENOUS
  Filled 2015-05-08: qty 2

## 2015-05-08 MED ORDER — FENTANYL CITRATE (PF) 100 MCG/2ML IJ SOLN: 25.0000 ug | INTRAMUSCULAR | Status: DC | PRN

## 2015-05-08 MED ORDER — LACTATED RINGERS IV SOLN
INTRAVENOUS | Status: DC
  Administered 2015-05-08 (×2): via INTRAVENOUS

## 2015-05-08 MED ORDER — CEFAZOLIN SODIUM-DEXTROSE 2-4 GM/100ML-% IV SOLN
2.0000 g | INTRAVENOUS | Status: AC
  Administered 2015-05-08: 2 g via INTRAVENOUS
  Filled 2015-05-08: qty 100

## 2015-05-08 MED ORDER — PHENYLEPHRINE HCL 10 MG/ML IJ SOLN
10.0000 mg | INTRAVENOUS | Status: DC | PRN
  Administered 2015-05-08: 40 ug/min via INTRAVENOUS

## 2015-05-08 MED ORDER — SUCCINYLCHOLINE CHLORIDE 200 MG/10ML IV SOSY
PREFILLED_SYRINGE | INTRAVENOUS | Status: AC
  Filled 2015-05-08: qty 10

## 2015-05-08 MED ORDER — HYDROMORPHONE HCL 1 MG/ML IJ SOLN
INTRAMUSCULAR | Status: AC
  Administered 2015-05-08: 0.5 mg via INTRAVENOUS
  Filled 2015-05-08: qty 1

## 2015-05-08 MED ORDER — MIDAZOLAM HCL 5 MG/5ML IJ SOLN
INTRAMUSCULAR | Status: DC | PRN
  Administered 2015-05-08 (×2): 1 mg via INTRAVENOUS

## 2015-05-08 MED ORDER — 0.9 % SODIUM CHLORIDE (POUR BTL) OPTIME
TOPICAL | Status: DC | PRN
  Administered 2015-05-08: 1000 mL

## 2015-05-08 SURGICAL SUPPLY — 66 items
APL SKNCLS STERI-STRIP NONHPOA (GAUZE/BANDAGES/DRESSINGS) ×3
BANDAGE ELASTIC 3 VELCRO ST LF (GAUZE/BANDAGES/DRESSINGS) ×2 IMPLANT
BANDAGE ELASTIC 4 VELCRO ST LF (GAUZE/BANDAGES/DRESSINGS) ×2 IMPLANT
BENZOIN TINCTURE PRP APPL 2/3 (GAUZE/BANDAGES/DRESSINGS) ×4 IMPLANT
BIT DRILL 2 FAST STEP (BIT) ×4 IMPLANT
BIT DRILL 2.5X4 QC (BIT) ×4 IMPLANT
BNDG CMPR 9X4 STRL LF SNTH (GAUZE/BANDAGES/DRESSINGS) ×3
BNDG COHESIVE 6X5 TAN STRL LF (GAUZE/BANDAGES/DRESSINGS) ×3 IMPLANT
BNDG ESMARK 4X9 LF (GAUZE/BANDAGES/DRESSINGS) ×5 IMPLANT
BNDG GAUZE ELAST 4 BULKY (GAUZE/BANDAGES/DRESSINGS) ×9 IMPLANT
CLOSURE STERI-STRIP 1/2X4 (GAUZE/BANDAGES/DRESSINGS) ×1
CLSR STERI-STRIP ANTIMIC 1/2X4 (GAUZE/BANDAGES/DRESSINGS) ×2 IMPLANT
CORDS BIPOLAR (ELECTRODE) ×4 IMPLANT
COVER SURGICAL LIGHT HANDLE (MISCELLANEOUS) ×5 IMPLANT
CUFF TOURNIQUET SINGLE 18IN (TOURNIQUET CUFF) ×1 IMPLANT
CUFF TOURNIQUET SINGLE 24IN (TOURNIQUET CUFF) IMPLANT
DRAPE C-ARM MINI 42X72 WSTRAPS (DRAPES) ×5 IMPLANT
DRAPE OEC MINIVIEW 54X84 (DRAPES) IMPLANT
DRAPE SURG 17X23 STRL (DRAPES) ×5 IMPLANT
DURAPREP 26ML APPLICATOR (WOUND CARE) ×5 IMPLANT
ELECT REM PT RETURN 9FT ADLT (ELECTROSURGICAL)
ELECTRODE REM PT RTRN 9FT ADLT (ELECTROSURGICAL) IMPLANT
GAUZE SPONGE 4X4 12PLY STRL (GAUZE/BANDAGES/DRESSINGS) ×9 IMPLANT
GAUZE XEROFORM 1X8 LF (GAUZE/BANDAGES/DRESSINGS) ×2 IMPLANT
GLOVE SURG SYN 8.0 (GLOVE) ×5 IMPLANT
GLOVE SURG SYN 8.0 PF PI (GLOVE) ×2 IMPLANT
GOWN STRL REUS W/ TWL LRG LVL3 (GOWN DISPOSABLE) ×3 IMPLANT
GOWN STRL REUS W/ TWL XL LVL3 (GOWN DISPOSABLE) ×3 IMPLANT
GOWN STRL REUS W/TWL LRG LVL3 (GOWN DISPOSABLE) ×5
GOWN STRL REUS W/TWL XL LVL3 (GOWN DISPOSABLE) ×5
KIT BASIN OR (CUSTOM PROCEDURE TRAY) ×5 IMPLANT
KIT ROOM TURNOVER OR (KITS) ×5 IMPLANT
MANIFOLD NEPTUNE II (INSTRUMENTS) ×5 IMPLANT
NDL HYPO 25GX1X1/2 BEV (NEEDLE) IMPLANT
NEEDLE HYPO 25GX1X1/2 BEV (NEEDLE) ×5 IMPLANT
NS IRRIG 1000ML POUR BTL (IV SOLUTION) ×5 IMPLANT
PACK ORTHO EXTREMITY (CUSTOM PROCEDURE TRAY) ×5 IMPLANT
PAD ARMBOARD 7.5X6 YLW CONV (MISCELLANEOUS) ×10 IMPLANT
PAD CAST 3X4 CTTN HI CHSV (CAST SUPPLIES) ×2 IMPLANT
PAD CAST 4YDX4 CTTN HI CHSV (CAST SUPPLIES) ×4 IMPLANT
PADDING CAST COTTON 3X4 STRL (CAST SUPPLIES)
PADDING CAST COTTON 4X4 STRL (CAST SUPPLIES)
PEG SUBCHONDRAL SMOOTH 2.0X22 (Peg) ×3 IMPLANT
PEG SUBCHONDRAL SMOOTH 2.0X24 (Peg) ×16 IMPLANT
PEG SUBCHONDRAL SMOOTH 2.0X26 (Peg) ×16 IMPLANT
PENCIL BUTTON HOLSTER BLD 10FT (ELECTRODE) IMPLANT
PLATE WIDE 28.2X62.6 LT (Plate) ×4 IMPLANT
SCREW BN 12X3.5XNS CORT TI (Screw) ×1 IMPLANT
SCREW CORT 3.5X12 (Screw) ×5 IMPLANT
SCREW CORT 3.5X14 LNG (Screw) ×6 IMPLANT
SPLINT FIBERGLASS 4X15 (CAST SUPPLIES) ×4 IMPLANT
SPONGE LAP 4X18 X RAY DECT (DISPOSABLE) IMPLANT
SPONGE SCRUB IODOPHOR (GAUZE/BANDAGES/DRESSINGS) ×5 IMPLANT
SUT PROLENE 3 0 PS 1 (SUTURE) ×3 IMPLANT
SUT PROLENE 3 0 PS 2 (SUTURE) IMPLANT
SUT VIC AB 2-0 CT1 27 (SUTURE) ×5
SUT VIC AB 2-0 CT1 TAPERPNT 27 (SUTURE) ×1 IMPLANT
SUT VIC AB 3-0 FS2 27 (SUTURE) IMPLANT
SUT VICRYL 4-0 PS2 18IN ABS (SUTURE) ×3 IMPLANT
SYR CONTROL 10ML LL (SYRINGE) ×5 IMPLANT
TOWEL OR 17X24 6PK STRL BLUE (TOWEL DISPOSABLE) ×5 IMPLANT
TOWEL OR 17X26 10 PK STRL BLUE (TOWEL DISPOSABLE) ×5 IMPLANT
TUBE CONNECTING 12'X1/4 (SUCTIONS)
TUBE CONNECTING 12X1/4 (SUCTIONS) IMPLANT
UNDERPAD 30X30 INCONTINENT (UNDERPADS AND DIAPERS) ×5 IMPLANT
WATER STERILE IRR 1000ML POUR (IV SOLUTION) ×5 IMPLANT

## 2015-05-08 NOTE — Consult Note (Signed)
Reason for Consult:left distal radius fracture Referring Physician: pickering  [name] is an 75 y.o. male.  HPI: s/p fall with displaced left distal radius fracture  Past Medical History  Diagnosis Date  . Anemia, iron deficiency 01/26/2011  . Cancer (HCC)     nhl, colon ca  . Diabetes mellitus   . Arthritis   . Asthma   . Hyperlipidemia   . Hypertension   . Myocardial infarction ([place])   . Carotid artery occlusion     right occlusion of carotid with moderate left plaque  . Coronary artery disease August 2005    MI with stent  . Kidney stones 05/2010    lithotripsy    Past Surgical History  Procedure Laterality Date  . Coronary stent placement    . Hernia repair    . Colon surgery    . Colonoscopy    . Repair of complex traction retinal detachment      Family History  Problem Relation Age of Onset  . Cancer Mother     ovarian  . Cancer Father     lymphoma  . Cancer Brother     colon  . Cancer Maternal Aunt     colon  . Healthy Sister     Social History:  reports that he quit smoking about 35 years ago. He has never used smokeless tobacco. He reports that he does not drink alcohol or use illicit drugs.  Allergies:  Allergies  Allergen Reactions  . Iodine Other (See Comments)     EYE SWELLING WITH CONTRAST MEDIA    Medications:  Scheduled: .  ceFAZolin (ANCEF) IV  2 g Intravenous On Call to OR  . chlorhexidine  60 mL Topical Once    Results for orders placed or performed during the hospital encounter of 05/08/15 (from the past 48 hour(s))  Glucose, capillary     Status: Abnormal   Collection Time: 05/08/15  3:10 PM  Result Value Ref Range   Glucose-Capillary 148 (H) 65 - 99 mg/dL  Glucose, capillary     Status: Abnormal   Collection Time: 05/08/15  5:32 PM  Result Value Ref Range   Glucose-Capillary 135 (H) 65 - 99 mg/dL    Dg Wrist Complete Left  05/08/2015  CLINICAL DATA:  [place] at home this morning, tried to catch himself with LEFT  hand, pain and deformity LEFT wrist, initial encounter EXAM: LEFT WRIST - COMPLETE 3+ VIEW COMPARISON:  None FINDINGS: Osseous demineralization. Comminuted distal LEFT radial metaphyseal fracture with intra-articular extension into the radiocarpal joint. Significant dorsal displacement with apex volar angulation and overriding. Displaced ulnar styloid fracture. Associated soft tissue swelling and deformity. IMPRESSION: Displaced LEFT ulnar styloid fracture. Comminuted displaced angulated intra-articular distal LEFT radial metaphyseal fracture. Electronically Signed   By: Lavonia Dana M.D.   On: 05/08/2015 11:37   Dg Knee Complete 4 Views Left  05/08/2015  CLINICAL DATA:  [place] at home this morning, abrasion anterior LEFT knee, pain, initial encounter EXAM: LEFT KNEE - COMPLETE 4+ VIEW COMPARISON:  None FINDINGS: Osseous demineralization. Tricompartmental osteoarthritic changes with joint space narrowing and spur formation greatest at medial compartment. Chondrocalcinosis question CPPD. No significant knee joint effusion. No acute fracture, dislocation or bone destruction. IMPRESSION: Osseous demineralization with tricompartmental osteoarthritic changes and question CPPD. No acute abnormalities. Electronically Signed   By: Lavonia Dana M.D.   On: 05/08/2015 11:38    Review of Systems  All other systems reviewed and are negative.  Blood pressure 166/58,  pulse 60, temperature 98 F (36.7 C), temperature source Oral, resp. rate 18, height 5\' 9"  (1.753 m), weight 113.399 kg (250 lb), SpO2 95 %. Physical Exam  Constitutional: He is oriented to person, place, and time. He appears well-developed and well-nourished.  HENT:  Head: Normocephalic and atraumatic.  Neck: Normal range of motion.  Cardiovascular: Normal rate.   Respiratory: Effort normal.  Musculoskeletal:       Left wrist: He exhibits tenderness, bony tenderness, swelling and deformity.  Left wrist distal radius fracture with pain and swelling and  deformity  Intermittent median numbness  Neurological: He is alert and oriented to person, place, and time.  Skin: Skin is warm.  Psychiatric: He has a normal mood and affect. His behavior is normal. Judgment and thought content normal.    Assessment/Plan: As above   Plan ORIF/CTR  Charlyne Robertshaw A 05/08/2015, 6:00 PM

## 2015-05-08 NOTE — OR Nursing (Signed)
Dentures given to patient's daughter

## 2015-05-08 NOTE — Progress Notes (Signed)
Orthopedic Tech Progress Note Patient Details:  Mark West. [birth year] [id number] Delivered arm sling to pt.'s nurse. Ortho Devices Type of Ortho Device: Arm sling Ortho Device/Splint Interventions: Other (comment)   Darrol Poke 05/08/2015, 8:54 PM

## 2015-05-08 NOTE — ED Notes (Signed)
MD at bedside. 

## 2015-05-08 NOTE — Anesthesia Preprocedure Evaluation (Signed)
Anesthesia Evaluation  Patient identified by MRN, date of birth, ID band Patient awake    Reviewed: Allergy & Precautions, H&P , NPO status , Patient's Chart, lab work & pertinent test results  History of Anesthesia Complications Negative for: history of anesthetic complications  Airway Mallampati: II  TM Distance: >3 FB Neck ROM: full    Dental no notable dental hx.    Pulmonary asthma , former smoker,    Pulmonary exam normal breath sounds clear to auscultation       Cardiovascular hypertension, + CAD, + Past MI and + Peripheral Vascular Disease  Normal cardiovascular exam Rhythm:regular Rate:Normal  2016 Echo with normal EF, no sig valve abnormalities   Neuro/Psych negative neurological ROS     GI/Hepatic negative GI ROS, Neg liver ROS,   Endo/Other  diabetesMorbid obesity  Renal/GU Renal disease     Musculoskeletal  (+) Arthritis ,   Abdominal   Peds  Hematology negative hematology ROS (+)   Anesthesia Other Findings   Reproductive/Obstetrics negative OB ROS                             Anesthesia Physical Anesthesia Plan  ASA: III  Anesthesia Plan: General and Regional   Post-op Pain Management: GA combined w/ Regional for post-op pain   Induction: Intravenous  Airway Management Planned: LMA  Additional Equipment:   Intra-op Plan:   Post-operative Plan: Extubation in OR  Informed Consent: I have reviewed the patients History and Physical, chart, labs and discussed the procedure including the risks, benefits and alternatives for the proposed anesthesia with the patient or authorized representative who has indicated his/her understanding and acceptance.   Dental Advisory Given  Plan Discussed with: Anesthesiologist, CRNA and Surgeon  Anesthesia Plan Comments:         Anesthesia Quick Evaluation

## 2015-05-08 NOTE — Anesthesia Procedure Notes (Addendum)
Procedure Name: LMA Insertion Date/Time: 05/08/2015 6:31 PM Performed by: Trixie Deis A Pre-anesthesia Checklist: Patient identified, Timeout performed, Emergency Drugs available, Suction available and Patient being monitored Patient Re-evaluated:Patient Re-evaluated prior to inductionOxygen Delivery Method: Circle system utilized Preoxygenation: Pre-oxygenation with 100% oxygen Intubation Type: IV induction LMA: LMA inserted LMA Size: 5.0 Number of attempts: 1 Placement Confirmation: positive ETCO2 and breath sounds checked- equal and bilateral Tube secured with: Tape Dental Injury: Teeth and Oropharynx as per pre-operative assessment    Anesthesia Regional Block:  Supraclavicular block  Pre-Anesthetic Checklist: ,, timeout performed, Correct Patient, Correct Site, Correct Laterality, Correct Procedure, Correct Position, site marked, Risks and benefits discussed,  Surgical consent,  Pre-op evaluation,  At surgeon's request and post-op pain management  Laterality: Left  Prep: chloraprep       Needles:  Injection technique: Single-shot  Needle Type: Echogenic Stimulator Needle     Needle Length: 9cm 9 cm Needle Gauge: 21 and 21 G    Additional Needles:  Procedures: ultrasound guided (picture in chart) Supraclavicular block Narrative:  Start time: 05/08/2015 6:15 PM End time: 05/08/2015 6:20 PM Injection made incrementally with aspirations every 5 mL.  Performed by: Personally   Additional Notes: 30 cc 0.5% bupivacaine injected easily

## 2015-05-08 NOTE — ED Notes (Signed)
Ortho Tech notified of orders

## 2015-05-08 NOTE — ED Notes (Signed)
Bed: [id number] Expected date:  Expected time:  Means of arrival:  Comments: EMS- 75yo M, L arm injury/obvious deformity

## 2015-05-08 NOTE — Anesthesia Postprocedure Evaluation (Signed)
Anesthesia Post Note  Patient: Mark West.  Procedure(s) Performed: Procedure(s) (LRB): OPEN REDUCTION INTERNAL FIXATION (ORIF) DISTAL RADIAL FRACTURE, CARPAL TUNNEL RELEASE (Left) CARPAL TUNNEL RELEASE (Left)  Patient location during evaluation: PACU Anesthesia Type: General and Regional Level of consciousness: awake, awake and alert and oriented Pain management: pain level controlled Vital Signs Assessment: post-procedure vital signs reviewed and stable Respiratory status: spontaneous breathing, nonlabored ventilation and respiratory function stable Cardiovascular status: blood pressure returned to baseline Anesthetic complications: no    Last Vitals:  Filed Vitals:   05/08/15 2030 05/08/15 2100  BP: 126/65 129/62  Pulse: 78 72  Temp:    Resp: 16 18    Last Pain:  Filed Vitals:   05/08/15 2129  PainSc: 0-No pain                 Maliki Gignac COKER

## 2015-05-08 NOTE — Op Note (Signed)
See note [id number]

## 2015-05-08 NOTE — Progress Notes (Signed)
Per Dr. Jillyn Hidden, B patient does not need a T&S here.

## 2015-05-08 NOTE — ED Notes (Signed)
75 yo male with PTAR then [facility] for pain control. Pt tripped over some brick planters at home, used left wrist to break fall. Left Arm displaced to the left with obvious deformity. Left knee with pain and abrasion and old edema.  [facility] 200 mcg Fentanyl   150/84 70 HR 95% RA 238 cbg

## 2015-05-08 NOTE — Transfer of Care (Signed)
Immediate Anesthesia Transfer of Care Note  Patient: Mark West.  Procedure(s) Performed: Procedure(s): OPEN REDUCTION INTERNAL FIXATION (ORIF) DISTAL RADIAL FRACTURE, CARPAL TUNNEL RELEASE (Left) CARPAL TUNNEL RELEASE (Left)  Patient Location: PACU  Anesthesia Type:General  Level of Consciousness: awake, alert  and oriented  Airway & Oxygen Therapy: Patient Spontanous Breathing and Patient connected to nasal cannula oxygen  Post-op Assessment: Report given to RN, Post -op Vital signs reviewed and stable and Patient moving all extremities  Post vital signs: Reviewed and stable  Last Vitals:  Filed Vitals:   05/08/15 1402 05/08/15 1514  BP: 154/74 166/58  Pulse: 61 60  Temp:  36.7 C  Resp: 18 18    Last Pain:  Filed Vitals:   05/08/15 1742  PainSc: Asleep      Patients Stated Pain Goal: 3 ([id number])  Complications: No apparent anesthesia complications

## 2015-05-08 NOTE — Progress Notes (Signed)
Orthopedic Tech Progress Note Patient Details:  Mark West. 10-26-1940 [id number] Delivered arm sling to pt.'s nurse. Ortho Devices Type of Ortho Device: Arm sling Ortho Device/Splint Interventions: Other (comment)   Darrol Poke 05/08/2015, 9:57 PM

## 2015-05-08 NOTE — ED Provider Notes (Signed)
CSN: [id number]     Arrival date & time 05/08/15  1037 History   First MD Initiated Contact with Patient 05/08/15 1043     Chief Complaint  Patient presents with  . Fall  . Arm Injury   PT SAID THAT HE TRIPPED AND FELL THIS AM.  HE C/O PAIN TO LEFT WRIST AND TO LEFT KNEE.  THE PT GIVEN 200 MCG OF FENTANYL BY EMS AND PAIN IS OK NOW.    (Consider location/radiation/quality/duration/timing/severity/associated sxs/prior Treatment) Patient is a 75 y.o. male presenting with fall and arm injury. The history is provided by the patient and the EMS personnel.  Fall This is a new problem. The current episode started less than 1 hour ago. The symptoms are relieved by narcotics.  Arm Injury   Past Medical History  Diagnosis Date  . Anemia, iron deficiency 01/26/2011  . Cancer (HCC)     nhl, colon ca  . Diabetes mellitus   . Arthritis   . Asthma   . Hyperlipidemia   . Hypertension   . Myocardial infarction ([place])   . Carotid artery occlusion     right occlusion of carotid with moderate left plaque  . Coronary artery disease August 2005    MI with stent  . Kidney stones 05/2010    lithotripsy   Past Surgical History  Procedure Laterality Date  . Coronary stent placement    . Hernia repair    . Colon surgery    . Colonoscopy    . Repair of complex traction retinal detachment     Family History  Problem Relation Age of Onset  . Cancer Mother     ovarian  . Cancer Father     lymphoma  . Cancer Brother     colon  . Cancer Maternal Aunt     colon  . Healthy Sister    Social History  Substance Use Topics  . Smoking status: Former Smoker    Quit date: 01/08/1983  . Smokeless tobacco: Never Used  . Alcohol Use: No    Review of Systems  Musculoskeletal:       LEFT WRIST DEFORMITY.  LEFT KNEE ABRASION AND PAIN.  All other systems reviewed and are negative.     Allergies  Iodine  Home Medications   Prior to Admission medications   Medication Sig Start Date End Date  Taking? Authorizing Provider  aspirin 81 MG tablet Take 81 mg by mouth daily.     Yes Historical Provider, MD  atorvastatin (LIPITOR) 40 MG tablet Take 40 mg by mouth daily.     Yes Historical Provider, MD  clopidogrel (PLAVIX) 75 MG tablet Take 75 mg by mouth daily.     Yes Historical Provider, MD  Coenzyme Q10 (CO Q 10 PO) Take 1 capsule by mouth daily.    Yes Historical Provider, MD  dorzolamide-timolol (COSOPT) 22.3-6.8 MG/ML ophthalmic solution Place 1 drop into both eyes 2 (two) times daily. 04/02/15  Yes Historical Provider, MD  fentaNYL (SUBLIMAZE) 100 MCG/2ML injection Inject 200 mcg into the vein once.   Yes Historical Provider, MD  glucosamine-chondroitin 500-400 MG tablet Take 1 tablet by mouth daily.    Yes Historical Provider, MD  lisinopril (PRINIVIL,ZESTRIL) 5 MG tablet Take 5 mg by mouth daily.  01/06/12  Yes Historical Provider, MD  Lutein 10 MG TABS Take 1 tablet by mouth daily.    Yes Historical Provider, MD  LYCOPENE PO Take 1 tablet by mouth daily.    Yes Historical Provider,  MD  metFORMIN (GLUCOPHAGE) 500 MG tablet Take 1,000 mg by mouth 2 (two) times daily with a meal.   Yes Historical Provider, MD  metoprolol (LOPRESSOR) 50 MG tablet Take 25 mg by mouth 2 (two) times daily.   Yes Historical Provider, MD  Omega-3 Fatty Acids (FISH OIL) 1000 MG CAPS Take 1 capsule by mouth daily.   Yes Historical Provider, MD  pantoprazole (PROTONIX) 40 MG tablet Take 40 mg by mouth daily.  06/30/13  Yes Historical Provider, MD  sitaGLIPtin (JANUVIA) 100 MG tablet Take 100 mg by mouth daily.     Yes Historical Provider, MD   BP 112/87 mmHg  Pulse 76  Temp(Src) 97.7 F (36.5 C) (Oral)  Resp 17  SpO2 97% Physical Exam  Constitutional: He is oriented to person, place, and time. He appears well-developed and well-nourished.  HENT:  Head: Normocephalic and atraumatic.  Right Ear: External ear normal.  Left Ear: External ear normal.  Nose: Nose normal.  Mouth/Throat: Oropharynx is clear  and moist.  Eyes: Conjunctivae and EOM are normal. Pupils are equal, round, and reactive to light.  Neck: Normal range of motion. Neck supple.  Cardiovascular: Normal rate, regular rhythm, normal heart sounds and intact distal pulses.   Pulmonary/Chest: Effort normal and breath sounds normal.  Abdominal: Soft. Bowel sounds are normal.  Musculoskeletal:       Left wrist: He exhibits decreased range of motion, tenderness, bony tenderness and deformity.  Neurological: He is alert and oriented to person, place, and time.  Skin: Skin is warm and dry.  Psychiatric: He has a normal mood and affect. His behavior is normal.  Nursing note and vitals reviewed.   ED Course  Procedures (including critical care time) Labs Review Labs Reviewed - No data to display  Imaging Review Dg Wrist Complete Left  05/08/2015  CLINICAL DATA:  [place] at home this morning, tried to catch himself with LEFT hand, pain and deformity LEFT wrist, initial encounter EXAM: LEFT WRIST - COMPLETE 3+ VIEW COMPARISON:  None FINDINGS: Osseous demineralization. Comminuted distal LEFT radial metaphyseal fracture with intra-articular extension into the radiocarpal joint. Significant dorsal displacement with apex volar angulation and overriding. Displaced ulnar styloid fracture. Associated soft tissue swelling and deformity. IMPRESSION: Displaced LEFT ulnar styloid fracture. Comminuted displaced angulated intra-articular distal LEFT radial metaphyseal fracture. Electronically Signed   By: Lavonia Dana M.D.   On: 05/08/2015 11:37   Dg Knee Complete 4 Views Left  05/08/2015  CLINICAL DATA:  [place] at home this morning, abrasion anterior LEFT knee, pain, initial encounter EXAM: LEFT KNEE - COMPLETE 4+ VIEW COMPARISON:  None FINDINGS: Osseous demineralization. Tricompartmental osteoarthritic changes with joint space narrowing and spur formation greatest at medial compartment. Chondrocalcinosis question CPPD. No significant knee joint effusion. No  acute fracture, dislocation or bone destruction. IMPRESSION: Osseous demineralization with tricompartmental osteoarthritic changes and question CPPD. No acute abnormalities. Electronically Signed   By: Lavonia Dana M.D.   On: 05/08/2015 11:38   I have personally reviewed and evaluated these images and lab results as part of my medical decision-making.   EKG Interpretation None      MDM  PT D/W THE PA FOR DR. Burney Gauze AND HE SAID THAT DR. Burney Gauze WANTED TO SEE PT AT [facility] TO DO SURGERY TONIGHT.  HE IS NOT ABLE TO DO WHAT HE NEEDS TO DO HERE AT [facility]. Final diagnoses:  Knee contusion, left, initial encounter  Accidental fall  Fracture of ulnar styloid, left, closed, initial encounter  Distal radius  fracture, left, closed, initial encounter        Isla Pence, MD 05/08/15 [phone]

## 2015-05-09 ENCOUNTER — Encounter (HOSPITAL_COMMUNITY): Payer: Self-pay | Admitting: Orthopedic Surgery

## 2015-05-09 NOTE — Op Note (Signed)
NAME:  Mark West, Mark West NO.:  [id number]  MEDICAL RECORD NO.:  [id number]  LOCATION:  [facility]                         FACILITY:  [facility]  PHYSICIAN:  Sheral Apley. Tyreshia Ingman, M.D.DATE OF BIRTH:  1940-07-14  DATE OF PROCEDURE:  05/08/2015 DATE OF DISCHARGE:  05/08/2015                              OPERATIVE REPORT   PREOPERATIVE DIAGNOSIS:  Displaced 4-part intra-articular left distal radius fracture with carpal tunnel symptoms including numbness in the median distribution.  POSTOPERATIVE DIAGNOSIS:  Displaced 4-part intra-articular left distal radius fracture with carpal tunnel symptoms including numbness in the median distribution.  PROCEDURE:  Open reduction, internal fixation, comminuted 4-part distal radius fracture left side with carpal tunnel symptoms including numbness and tingling in the median distribution, left side.  SURGEON:  Sheral Apley. Burney Gauze, M.D.  ASSISTANT:  None.  ANESTHESIA:  Axillary block and general.  COMPLICATIONS:  No complications.  DRAINS:  No drains.  DESCRIPTION OF PROCEDURE:  The patient was taken to the operating suite. After induction of adequate axillary block analgesia and then general laryngeal mask airway anesthetic, the left upper extremity was prepped and draped in sterile fashion.  An Esmarch was used to exsanguinate the limb.  Tourniquet was then inflated to 275 mmHg.  At this point in time, the skin was incised in the interval between the flexor carpi radialis and radial artery.  Skin incised sharply 5-6 cm.  The FCR was identified.  The sheath overlying the FCR was incised.  It was retracted to the midline, the radial artery to the lateral side.  The fascia overlying this was incised.  Dissection was carried down to the level of the pronator quadratus which was already partially interrupted from the dorsally displaced distal radius fracture.  We carefully released the brachioradialis off the radial styloid fragment to  ease in reduction, flexion and ulnar deviation.  Traction was used to reduce the 4-part fracture with significant comminution along the radial styloid area.  A standard DVR plate was fastened to the lower aspect of the distal radius to the slotted hole.  Intraoperative fluoroscopy revealed inadequate coverage of the plate, we then switched to the wide DVR plate.  We fixed with 3 screws proximally, followed by the smooth pegs distally. Intraoperative fluoroscopy revealed adequate reduction in AP, lateral, and oblique views.  The wound was thoroughly irrigated.  The pronator quadratus was loosely closed over the plate with 2-0 undyed Vicryl.  The median nerve was identified in the proximal aspect of the wound.  We carefully identified the palmar cutaneous branch of the median nerve. We traced the median nerve to the edge of the carpal tunnel.  With the retractor retracting the skin, we created a path dorsal and volar to the transverse carpal ligament.  We divided from proximal to distal release of the nerve.  We irrigated this as well.  We then loosely closed with 2-0 undyed Vicryl subcutaneously and a 3-0 Prolene subcuticular stitch on the skin.  Steri-Strips, 4x4s, fluffs, and a compressive dressing and volar splint was applied.  The patient tolerated all procedures well and went to recovery room in stable fashion.     Sheral Apley Burney Gauze, M.D.     MAW/MEDQ  D:  05/08/2015  T:  05/09/2015  Job:  [id number]

## 2015-05-11 DIAGNOSIS — S52572D Other intraarticular fracture of lower end of left radius, subsequent encounter for closed fracture with routine healing: Secondary | ICD-10-CM | POA: Diagnosis not present

## 2015-05-11 DIAGNOSIS — S52572A Other intraarticular fracture of lower end of left radius, initial encounter for closed fracture: Secondary | ICD-10-CM | POA: Diagnosis not present

## 2015-05-11 DIAGNOSIS — M25532 Pain in left wrist: Secondary | ICD-10-CM | POA: Diagnosis not present

## 2015-05-22 DIAGNOSIS — H401130 Primary open-angle glaucoma, bilateral, stage unspecified: Secondary | ICD-10-CM | POA: Diagnosis not present

## 2015-05-22 DIAGNOSIS — H268 Other specified cataract: Secondary | ICD-10-CM | POA: Diagnosis not present

## 2015-05-22 DIAGNOSIS — H5440 Blindness, one eye, unspecified eye: Secondary | ICD-10-CM | POA: Diagnosis not present

## 2015-05-22 DIAGNOSIS — H2510 Age-related nuclear cataract, unspecified eye: Secondary | ICD-10-CM | POA: Diagnosis not present

## 2015-06-01 DIAGNOSIS — S52572D Other intraarticular fracture of lower end of left radius, subsequent encounter for closed fracture with routine healing: Secondary | ICD-10-CM | POA: Diagnosis not present

## 2015-06-26 DIAGNOSIS — S52572A Other intraarticular fracture of lower end of left radius, initial encounter for closed fracture: Secondary | ICD-10-CM | POA: Diagnosis not present

## 2015-06-26 DIAGNOSIS — S52572D Other intraarticular fracture of lower end of left radius, subsequent encounter for closed fracture with routine healing: Secondary | ICD-10-CM | POA: Diagnosis not present

## 2015-07-17 DIAGNOSIS — S52572A Other intraarticular fracture of lower end of left radius, initial encounter for closed fracture: Secondary | ICD-10-CM | POA: Diagnosis not present

## 2015-07-17 DIAGNOSIS — S52572D Other intraarticular fracture of lower end of left radius, subsequent encounter for closed fracture with routine healing: Secondary | ICD-10-CM | POA: Diagnosis not present

## 2015-08-11 ENCOUNTER — Telehealth: Payer: Self-pay | Admitting: Interventional Cardiology

## 2015-08-11 ENCOUNTER — Other Ambulatory Visit: Payer: Self-pay | Admitting: Interventional Cardiology

## 2015-08-11 ENCOUNTER — Telehealth: Payer: Self-pay

## 2015-08-11 MED ORDER — NITROGLYCERIN 0.4 MG SL SUBL: 0.4000 mg | SUBLINGUAL_TABLET | SUBLINGUAL | 3 refills | Status: DC | PRN

## 2015-08-11 NOTE — Telephone Encounter (Signed)
Follow-up     Returning Lisa'a call

## 2015-08-11 NOTE — Telephone Encounter (Signed)
Spoke with pt. Pt sts that he has had an Rx for Nitroglycerin for years. He is traveling to [place] and forgot his Nitro at home. He is doing well, but does not like to travel without it. Verified that pt does not use any medicatins for ED.  Rx for Nitro called in to the [facility] in [place], [place] as requested by the pt.  Pt aware and voiced appreciation for the call.

## 2015-08-11 NOTE — Telephone Encounter (Signed)
Follow Up:    Pt caling agaiin,says he needs his medicine today right away please.. The phone number for [facility] was wrong-it [phone]. Ask for [facility].

## 2015-08-11 NOTE — Telephone Encounter (Signed)
New message      *STAT* If patient is at the pharmacy, call can be transferred to refill team.   1. Which medications need to be refilled? (please list name of each medication and dose if known) Nitroglycerin pt did not provide the strength 2. Which pharmacy/location (including street and city if local pharmacy) is medication to be sent to?asked to sent [place], [place] [facility](phone number [phone] [address])  3. Do they need a 30 day or 90 day supply? Not stated

## 2015-08-11 NOTE — Telephone Encounter (Signed)
Follow up   *STAT* If patient is at the pharmacy, call can be transferred to refill team.   1. Which medications need to be refilled? (please list name of each medication and dose if known) NitroGlycerin  2. Which pharmacy/location (including street and city if local pharmacy) is medication to be sent to? [facility] [place], [place], [place]  3. Do they need a 30 day or 90 day supply? Pt did not state  Pt returning call to [facility] about prescription refill.

## 2015-08-15 DIAGNOSIS — S52572A Other intraarticular fracture of lower end of left radius, initial encounter for closed fracture: Secondary | ICD-10-CM | POA: Diagnosis not present

## 2015-08-15 DIAGNOSIS — S52572D Other intraarticular fracture of lower end of left radius, subsequent encounter for closed fracture with routine healing: Secondary | ICD-10-CM | POA: Diagnosis not present

## 2015-10-16 ENCOUNTER — Encounter (INDEPENDENT_AMBULATORY_CARE_PROVIDER_SITE_OTHER): Payer: Self-pay

## 2015-10-16 ENCOUNTER — Ambulatory Visit (INDEPENDENT_AMBULATORY_CARE_PROVIDER_SITE_OTHER): Payer: PPO | Admitting: Nurse Practitioner

## 2015-10-16 ENCOUNTER — Encounter: Payer: Self-pay | Admitting: Nurse Practitioner

## 2015-10-16 VITALS — BP 160/90 | HR 66 | Ht 69.0 in | Wt 246.0 lb

## 2015-10-16 DIAGNOSIS — L989 Disorder of the skin and subcutaneous tissue, unspecified: Secondary | ICD-10-CM | POA: Diagnosis not present

## 2015-10-16 DIAGNOSIS — I252 Old myocardial infarction: Secondary | ICD-10-CM | POA: Diagnosis not present

## 2015-10-16 DIAGNOSIS — I2510 Atherosclerotic heart disease of native coronary artery without angina pectoris: Secondary | ICD-10-CM

## 2015-10-16 DIAGNOSIS — Z7984 Long term (current) use of oral hypoglycemic drugs: Secondary | ICD-10-CM | POA: Diagnosis not present

## 2015-10-16 DIAGNOSIS — C189 Malignant neoplasm of colon, unspecified: Secondary | ICD-10-CM | POA: Diagnosis not present

## 2015-10-16 DIAGNOSIS — Z125 Encounter for screening for malignant neoplasm of prostate: Secondary | ICD-10-CM | POA: Diagnosis not present

## 2015-10-16 DIAGNOSIS — I739 Peripheral vascular disease, unspecified: Secondary | ICD-10-CM | POA: Diagnosis not present

## 2015-10-16 DIAGNOSIS — E782 Mixed hyperlipidemia: Secondary | ICD-10-CM

## 2015-10-16 DIAGNOSIS — I10 Essential (primary) hypertension: Secondary | ICD-10-CM

## 2015-10-16 DIAGNOSIS — E1142 Type 2 diabetes mellitus with diabetic polyneuropathy: Secondary | ICD-10-CM | POA: Diagnosis not present

## 2015-10-16 NOTE — Patient Instructions (Addendum)
We will be checking the following labs today - NONE   Medication Instructions:    Continue with your current medicines. BUT  DO NOT TAKE YOUR ATORVASTATIN UNTIL YOU SEE DR. HUSAIN BACK    Testing/Procedures To Be Arranged:  Carotid dopplers in December  Follow-Up:   See Dr. Tamala Julian in 6 months.    Other Special Instructions:   Let us know how your labs turn out from your primary care doctor    If you need a refill on your cardiac medications before your next appointment, please call your pharmacy.   Call the [place] office at [phone] if you have any questions, problems or concerns.

## 2015-10-16 NOTE — Progress Notes (Signed)
CARDIOLOGY OFFICE NOTE  Date:  10/16/2015    Mark West. Date of Birth: 1940-04-11 Medical Record [id number]  PCP:  Mark Low, MD  Cardiologist:  Mark West   Chief Complaint  Patient presents with  . Coronary Artery Disease    1 year check - seen for Dr. Tamala West    History of Present Illness: Mark West. is a 75 y.o. male who presents today for a one year check. Seen for Dr. Tamala West.   He has known CAD with stent 2005 (location unknown and no records available), hypertension, hyperlipidemia, right carotid occlusion, diastolic heart failure, and diabetes mellitus.  Patient is sedentary. He has left knee trouble that prevents exercise.  He had a West risk myocardial perfusion study in July 2015 and a carotid Doppler study done in December 2015.  There is total occlusion of the right internal carotid artery. Left internal carotid is less than 40% obstructed. He is followed by Dr. Marko West for iron deficiency anemia and follicular lymphoma.   Last seen a year ago - was felt to be doing ok.   Comes in today. Here alone. Just had a birthday - shared his birthday video with me. [place]. Still with knee issues. He fell back in May - broke his left wrist - had to have surgery. His cardiac status seems ok. No chest pain. Not short of breath. Remains pretty inactive. He remains sedentary. His primary concern is that "people are telling me I look yellow". The palms of his hands are yellow. Says his urine is not that dark. No belly pain. He has had prior colon cancer. Sees Dr. Teena West every 2 years. Saw PCP and had labs and urine done - waiting on results.   Past Medical History:  Diagnosis Date  . Anemia, iron deficiency 01/26/2011  . Arthritis   . Asthma   . Cancer (HCC)    nhl, colon ca  . Carotid artery occlusion    right occlusion of carotid with moderate left plaque  . Coronary artery disease August 2005   MI with stent  . Diabetes mellitus   . Hyperlipidemia     . Hypertension   . Kidney stones 05/2010   lithotripsy  . Myocardial infarction     Past Surgical History:  Procedure Laterality Date  . CARPAL TUNNEL RELEASE Left 05/08/2015   Procedure: CARPAL TUNNEL RELEASE;  Surgeon: Mark Crumb, MD;  Location: [facility];  Service: Orthopedics;  Laterality: Left;  . COLON SURGERY    . COLONOSCOPY    . CORONARY STENT PLACEMENT    . HERNIA REPAIR    . OPEN REDUCTION INTERNAL FIXATION (ORIF) DISTAL RADIAL FRACTURE Left 05/08/2015   Procedure: OPEN REDUCTION INTERNAL FIXATION (ORIF) DISTAL RADIAL FRACTURE, CARPAL TUNNEL RELEASE;  Surgeon: Mark Crumb, MD;  Location: [facility];  Service: Orthopedics;  Laterality: Left;  . REPAIR OF COMPLEX TRACTION RETINAL DETACHMENT       Medications: Current Outpatient Prescriptions  Medication Sig Dispense Refill  . aspirin 81 MG tablet Take 81 mg by mouth daily.      Marland Kitchen atorvastatin (LIPITOR) 40 MG tablet Take 40 mg by mouth daily.      . clopidogrel (PLAVIX) 75 MG tablet Take 75 mg by mouth daily.      . Coenzyme Q10 (CO Q 10 PO) Take 1 capsule by mouth daily.     . dorzolamide-timolol (COSOPT) 22.3-6.8 MG/ML ophthalmic solution Place 1 drop into both eyes 2 (two) times daily.    Marland Kitchen  fentaNYL (SUBLIMAZE) 100 MCG/2ML injection Inject 200 mcg into the vein once.    Marland Kitchen glucosamine-chondroitin 500-400 MG tablet Take 1 tablet by mouth daily.     Marland Kitchen lisinopril (PRINIVIL,ZESTRIL) 5 MG tablet Take 5 mg by mouth daily.     . Lutein 10 MG TABS Take 1 tablet by mouth daily.     Marland Kitchen LYCOPENE PO Take 1 tablet by mouth daily.     . metFORMIN (GLUCOPHAGE) 500 MG tablet Take 1,000 mg by mouth 2 (two) times daily with a meal.    . metoprolol (LOPRESSOR) 50 MG tablet Take 25 mg by mouth 2 (two) times daily.    . nitroGLYCERIN (NITROSTAT) 0.4 MG SL tablet Place 1 tablet (0.4 mg total) under the tongue every 5 (five) minutes as needed for chest pain. 25 tablet 3  . Omega-3 Fatty Acids (FISH OIL) 1000 MG CAPS Take 1 capsule by mouth  daily.    Marland Kitchen oxyCODONE-acetaminophen (ROXICET) 5-325 MG tablet Take 1 tablet by mouth every 4 (four) hours as needed for severe pain. 30 tablet 0  . pantoprazole (PROTONIX) 40 MG tablet Take 40 mg by mouth daily.     . sitaGLIPtin (JANUVIA) 100 MG tablet Take 100 mg by mouth daily.       No current facility-administered medications for this visit.     Allergies: Allergies  Allergen Reactions  . Iodine Other (See Comments)     EYE SWELLING WITH CONTRAST MEDIA    Social History: The patient  reports that he quit smoking about 35 years ago. He has a 20.00 pack-year smoking history. He has never used smokeless tobacco. He reports that he does not drink alcohol or use drugs.   Family History: The patient's family history includes Cancer in his brother, father, maternal aunt, and mother; Healthy in his sister.   Review of Systems: Please see the history of present illness.   Otherwise, the review of systems is positive for none.   All other systems are reviewed and negative.   Physical Exam: VS:  BP (!) 160/90   Pulse 66   Ht 5\' 9"  (1.753 m)   Wt 246 lb (111.6 kg)   BMI 36.33 kg/m  .  BMI Body mass index is 36.33 kg/m.  Wt Readings from Last 3 Encounters:  10/16/15 246 lb (111.6 kg)  05/08/15 250 lb (113.4 kg)  12/29/14 249 lb 12.8 oz (113.3 kg)     General: Pleasant. Obese male who is alert and in no acute distress.  He has a faint hue of jaundice noted. His palms are definitely yellow.  HEENT: Normal. His sclera look fine.  Neck: Supple, no JVD, carotid bruits, or masses noted.  Cardiac: Regular rate and rhythm. No murmurs, rubs, or gallops. No edema.  Respiratory:  Lungs are clear to auscultation bilaterally with normal work of breathing.  GI: Soft and nontender.  MS: No deformity or atrophy. Gait and ROM intact.  Skin: Warm and dry. Color is slightly jaundiced.  Neuro:  Strength and sensation are intact and no gross focal deficits noted.  Psych: Alert, appropriate and  with normal affect.   LABORATORY DATA:  EKG:  EKG is ordered today. This demonstrates NSR.  Lab Results  Component Value Date   WBC 5.6 01/05/2015   HGB 13.2 01/05/2015   HCT 42.4 01/05/2015   PLT 199 01/05/2015   GLUCOSE 183 (H) 01/05/2015   CHOL 139 05/08/2005   TRIG 97 05/08/2005   HDL 31 (L) 05/08/2005  LDLCALC 89 05/08/2005   ALT 27 01/05/2015   AST 25 01/05/2015   NA 140 01/05/2015   K 4.7 01/05/2015   CL 105 01/05/2015   CREATININE 0.88 01/05/2015   BUN 20 01/05/2015   CO2 23 01/05/2015   INR 1.08 05/03/2010   HGBA1C (H) 08/05/2006    6.6 (NOTE)   The ADA recommends the following therapeutic goals for glycemic   control related to Hgb A1C measurement:   Goal of Therapy:   < 7.0% Hgb A1C   Action Suggested:  > 8.0% Hgb A1C   Ref:  Diabetes Care, 22, Suppl. 1, 1999    BNP (last 3 results) No results for input(s): BNP in the last 8760 hours.  ProBNP (last 3 results) No results for input(s): PROBNP in the last 8760 hours.   Other Studies Reviewed Today:  Echo Study Conclusions from 09/2014  - Left ventricle: The cavity size was normal. Wall thickness was   normal. Systolic function was normal. The estimated ejection   fraction was in the range of 55% to 60%. Wall motion was normal;   there were no regional wall motion abnormalities. Doppler   parameters are consistent with abnormal left ventricular   relaxation (grade 1 diastolic dysfunction).   Myoview Impression 07/2013 Exercise Capacity:  Lexiscan with no exercise. BP Response:  Normal blood pressure response. Clinical Symptoms:  No significant symptoms noted. ECG Impression:  No significant ST segment change suggestive of ischemia. Comparison with Prior Nuclear Study: No images to compare  Overall Impression:  Normal stress nuclear study.  LV Ejection Fraction: 58%.  LV Wall Motion:  NL LV Function; NL Wall Motion.  Assessment/Plan: 1. CAD - no active symptoms. Would continue with medical  management.   2. HTN - BP good at PCP's office - recheck by me is 150/80.   3. HLD - on statin - I have asked him to hold until he goes back to see his PCP.   4. Carotid disease - needs repeat study in December of 2017 - will get this scheduled  5. Jaundice - may need CT scan - has had prior colon cancer. Evaluation underway - I am stopping his statin for now.   Current medicines are reviewed with the patient today.  The patient does not have concerns regarding medicines other than what has been noted above.  The following changes have been made:  See above.  Labs/ tests ordered today include:   No orders of the defined types were placed in this encounter.    Disposition:   FU with Dr. Tamala West in 6 months.  Patient is agreeable to this plan and will call if any problems develop in the interim.   Signed: Burtis Junes, RN, ANP-C 10/16/2015 2:05 PM  [facility] [address] [place], [place]  [address] Phone: [phone] Fax: [phone]

## 2015-10-23 ENCOUNTER — Ambulatory Visit: Payer: PPO | Admitting: Interventional Cardiology

## 2015-10-30 DIAGNOSIS — H401111 Primary open-angle glaucoma, right eye, mild stage: Secondary | ICD-10-CM | POA: Diagnosis not present

## 2015-10-30 DIAGNOSIS — H401122 Primary open-angle glaucoma, left eye, moderate stage: Secondary | ICD-10-CM | POA: Diagnosis not present

## 2015-10-30 DIAGNOSIS — H524 Presbyopia: Secondary | ICD-10-CM | POA: Diagnosis not present

## 2015-11-13 DIAGNOSIS — N200 Calculus of kidney: Secondary | ICD-10-CM | POA: Diagnosis not present

## 2015-11-13 DIAGNOSIS — N281 Cyst of kidney, acquired: Secondary | ICD-10-CM | POA: Diagnosis not present

## 2015-11-15 ENCOUNTER — Telehealth: Payer: Self-pay | Admitting: *Deleted

## 2015-11-15 NOTE — Telephone Encounter (Signed)
  West, Mark Flight. Male, 75 y.o., 1940/07/17 Weight:  246 lb (111.6 kg) Phone:  [phone] PCP:  Wenda Low, MD [facility] Designated Party Release  General MRN:  [id number] MyChart:  Declined Next Appt:  12/18/2015  appointment request  Received: Today  Message Contents  Gordy Levan, MD  Cherylynn Ridges, RN; Baruch Merl, RN        Triage note seen:  "I feel like I've been in a car wreck and need to see Dr. [name] today. She steers me in the right direction. I started have pain mid week last week. Believe I passed a kidney stone Sunday morning but pain continues and worse. I saw urologist, CT performed, no stones found. I hurt right side of my back with spasms, hips, thighs, left shoulder, knees almost total immobility. I have not slept in three nights. I take tylenol, it eases but I can't take tylenol dailyDr. Deforest Hoyles is not in this week."   All ended by patient call from PCP office came at 0933.    Returned patient's call. No appointment with PCP. Asked to "see Dr. [name] tomorrow. She's the best doctor in town. I do not want to go to Urgent Care and do not think anyone will do anything but give me a pain medicine.  I'm not a wuss. I fell May 1 st and hurt my arm and have a bad knee. Return number [phone]."    MD reply:  I am so sorry, my schedule is completely full this week. RN please call his PCP office Wenda Low), as someone other than PCP may be able to see him there today or at least tomorrow. He also has orthopedic MD if this seems to be primarily back or ortho related, and the ortho offices often have availability for urgent work ins. He could see [facility] symptom management if PCP cannot/ if does not seem ortho related, and if [facility] has any spaces. It would not be wrong for him to go to ED if severe symptoms.   Please request renal stone CT report from urology for this EMR   Thanks  Mark West

## 2015-11-15 NOTE — Telephone Encounter (Signed)
"  I feel like I've been in a car wreck and need to see Dr. Marko West today.  She steers me in the right direction.  I started have pain mid week last week.  Believe I passed a kidney stone Sunday morning but pain continues and worse.  I saw urologist, CT performed, no stones found.  I hurt right side of my back with spasms, hips, thighs, left shoulder, knees almost total immobility.  I have not slept in three nights.  I take tylenol, it eases but I can't take tylenol dailyDr. Deforest Hoyles is not in this week."   All ended by patient call from PCP office came at 0933.  Returned patient's call.  No appointment with PCP.  Asked to "see Dr. Marko West tomorrow.  She's the best doctor in town.  I do not want to go to Urgent Care and do not think anyone will do anything but give me a pain medicine.   I'm not a wuss.  I fell May 1 st and hurt my arm and have a bad knee.  Return number [phone]."

## 2015-11-15 NOTE — Telephone Encounter (Signed)
Spoke with Mark West and he took some tylenol and he was comfortable now.  He did not want to go to PCP office this afternoon.  He was agreeable to see Dr. Delfina Redwood tomorrow morning at 1000. Told Mark West that if the pain increases prior to his appointment tomorrow that she should go to the ED to be evaluated. Mark West verbalized understanding and appreciation for help with appointment.

## 2015-11-16 DIAGNOSIS — R238 Other skin changes: Secondary | ICD-10-CM | POA: Diagnosis not present

## 2015-11-16 DIAGNOSIS — M5442 Lumbago with sciatica, left side: Secondary | ICD-10-CM | POA: Diagnosis not present

## 2015-11-21 DIAGNOSIS — M5136 Other intervertebral disc degeneration, lumbar region: Secondary | ICD-10-CM | POA: Diagnosis not present

## 2015-11-21 DIAGNOSIS — M5137 Other intervertebral disc degeneration, lumbosacral region: Secondary | ICD-10-CM | POA: Diagnosis not present

## 2015-11-21 DIAGNOSIS — M9901 Segmental and somatic dysfunction of cervical region: Secondary | ICD-10-CM | POA: Diagnosis not present

## 2015-11-21 DIAGNOSIS — M9903 Segmental and somatic dysfunction of lumbar region: Secondary | ICD-10-CM | POA: Diagnosis not present

## 2015-11-21 DIAGNOSIS — M9905 Segmental and somatic dysfunction of pelvic region: Secondary | ICD-10-CM | POA: Diagnosis not present

## 2015-11-21 DIAGNOSIS — M5124 Other intervertebral disc displacement, thoracic region: Secondary | ICD-10-CM | POA: Diagnosis not present

## 2015-11-23 DIAGNOSIS — M5124 Other intervertebral disc displacement, thoracic region: Secondary | ICD-10-CM | POA: Diagnosis not present

## 2015-11-23 DIAGNOSIS — M5136 Other intervertebral disc degeneration, lumbar region: Secondary | ICD-10-CM | POA: Diagnosis not present

## 2015-11-23 DIAGNOSIS — M9905 Segmental and somatic dysfunction of pelvic region: Secondary | ICD-10-CM | POA: Diagnosis not present

## 2015-11-23 DIAGNOSIS — M9901 Segmental and somatic dysfunction of cervical region: Secondary | ICD-10-CM | POA: Diagnosis not present

## 2015-11-23 DIAGNOSIS — M9903 Segmental and somatic dysfunction of lumbar region: Secondary | ICD-10-CM | POA: Diagnosis not present

## 2015-11-23 DIAGNOSIS — M5137 Other intervertebral disc degeneration, lumbosacral region: Secondary | ICD-10-CM | POA: Diagnosis not present

## 2015-11-27 DIAGNOSIS — M5136 Other intervertebral disc degeneration, lumbar region: Secondary | ICD-10-CM | POA: Diagnosis not present

## 2015-11-27 DIAGNOSIS — M5137 Other intervertebral disc degeneration, lumbosacral region: Secondary | ICD-10-CM | POA: Diagnosis not present

## 2015-11-27 DIAGNOSIS — M5124 Other intervertebral disc displacement, thoracic region: Secondary | ICD-10-CM | POA: Diagnosis not present

## 2015-11-27 DIAGNOSIS — M9901 Segmental and somatic dysfunction of cervical region: Secondary | ICD-10-CM | POA: Diagnosis not present

## 2015-11-27 DIAGNOSIS — M549 Dorsalgia, unspecified: Secondary | ICD-10-CM | POA: Diagnosis not present

## 2015-11-27 DIAGNOSIS — M9905 Segmental and somatic dysfunction of pelvic region: Secondary | ICD-10-CM | POA: Diagnosis not present

## 2015-11-27 DIAGNOSIS — M9903 Segmental and somatic dysfunction of lumbar region: Secondary | ICD-10-CM | POA: Diagnosis not present

## 2015-11-28 DIAGNOSIS — H2521 Age-related cataract, morgagnian type, right eye: Secondary | ICD-10-CM | POA: Diagnosis not present

## 2015-11-28 DIAGNOSIS — H353121 Nonexudative age-related macular degeneration, left eye, early dry stage: Secondary | ICD-10-CM | POA: Diagnosis not present

## 2015-11-28 DIAGNOSIS — H348112 Central retinal vein occlusion, right eye, stable: Secondary | ICD-10-CM | POA: Diagnosis not present

## 2015-11-28 DIAGNOSIS — H35372 Puckering of macula, left eye: Secondary | ICD-10-CM | POA: Diagnosis not present

## 2015-11-28 DIAGNOSIS — H401111 Primary open-angle glaucoma, right eye, mild stage: Secondary | ICD-10-CM | POA: Diagnosis not present

## 2015-11-29 DIAGNOSIS — M5136 Other intervertebral disc degeneration, lumbar region: Secondary | ICD-10-CM | POA: Diagnosis not present

## 2015-11-29 DIAGNOSIS — M9901 Segmental and somatic dysfunction of cervical region: Secondary | ICD-10-CM | POA: Diagnosis not present

## 2015-11-29 DIAGNOSIS — M9905 Segmental and somatic dysfunction of pelvic region: Secondary | ICD-10-CM | POA: Diagnosis not present

## 2015-11-29 DIAGNOSIS — M9903 Segmental and somatic dysfunction of lumbar region: Secondary | ICD-10-CM | POA: Diagnosis not present

## 2015-11-29 DIAGNOSIS — M5124 Other intervertebral disc displacement, thoracic region: Secondary | ICD-10-CM | POA: Diagnosis not present

## 2015-11-29 DIAGNOSIS — M5137 Other intervertebral disc degeneration, lumbosacral region: Secondary | ICD-10-CM | POA: Diagnosis not present

## 2015-12-04 DIAGNOSIS — M5124 Other intervertebral disc displacement, thoracic region: Secondary | ICD-10-CM | POA: Diagnosis not present

## 2015-12-04 DIAGNOSIS — M5137 Other intervertebral disc degeneration, lumbosacral region: Secondary | ICD-10-CM | POA: Diagnosis not present

## 2015-12-04 DIAGNOSIS — M5136 Other intervertebral disc degeneration, lumbar region: Secondary | ICD-10-CM | POA: Diagnosis not present

## 2015-12-04 DIAGNOSIS — M9901 Segmental and somatic dysfunction of cervical region: Secondary | ICD-10-CM | POA: Diagnosis not present

## 2015-12-04 DIAGNOSIS — M9903 Segmental and somatic dysfunction of lumbar region: Secondary | ICD-10-CM | POA: Diagnosis not present

## 2015-12-04 DIAGNOSIS — M9905 Segmental and somatic dysfunction of pelvic region: Secondary | ICD-10-CM | POA: Diagnosis not present

## 2015-12-08 DIAGNOSIS — M5137 Other intervertebral disc degeneration, lumbosacral region: Secondary | ICD-10-CM | POA: Diagnosis not present

## 2015-12-08 DIAGNOSIS — M9901 Segmental and somatic dysfunction of cervical region: Secondary | ICD-10-CM | POA: Diagnosis not present

## 2015-12-08 DIAGNOSIS — M5124 Other intervertebral disc displacement, thoracic region: Secondary | ICD-10-CM | POA: Diagnosis not present

## 2015-12-08 DIAGNOSIS — M5136 Other intervertebral disc degeneration, lumbar region: Secondary | ICD-10-CM | POA: Diagnosis not present

## 2015-12-08 DIAGNOSIS — M9905 Segmental and somatic dysfunction of pelvic region: Secondary | ICD-10-CM | POA: Diagnosis not present

## 2015-12-08 DIAGNOSIS — M9903 Segmental and somatic dysfunction of lumbar region: Secondary | ICD-10-CM | POA: Diagnosis not present

## 2015-12-18 ENCOUNTER — Inpatient Hospital Stay (HOSPITAL_COMMUNITY): Admission: RE | Admit: 2015-12-18 | Payer: PPO | Source: Ambulatory Visit

## 2015-12-18 ENCOUNTER — Ambulatory Visit (HOSPITAL_COMMUNITY)
Admission: RE | Admit: 2015-12-18 | Discharge: 2015-12-18 | Disposition: A | Discharge status: Home / Self Care | Payer: PPO | Source: Ambulatory Visit | Attending: Cardiology | Admitting: Cardiology

## 2015-12-18 DIAGNOSIS — I6523 Occlusion and stenosis of bilateral carotid arteries: Secondary | ICD-10-CM | POA: Insufficient documentation

## 2015-12-26 DIAGNOSIS — M5124 Other intervertebral disc displacement, thoracic region: Secondary | ICD-10-CM | POA: Diagnosis not present

## 2015-12-26 DIAGNOSIS — M9905 Segmental and somatic dysfunction of pelvic region: Secondary | ICD-10-CM | POA: Diagnosis not present

## 2015-12-26 DIAGNOSIS — M5136 Other intervertebral disc degeneration, lumbar region: Secondary | ICD-10-CM | POA: Diagnosis not present

## 2015-12-26 DIAGNOSIS — M5137 Other intervertebral disc degeneration, lumbosacral region: Secondary | ICD-10-CM | POA: Diagnosis not present

## 2015-12-26 DIAGNOSIS — M9903 Segmental and somatic dysfunction of lumbar region: Secondary | ICD-10-CM | POA: Diagnosis not present

## 2015-12-26 DIAGNOSIS — M9901 Segmental and somatic dysfunction of cervical region: Secondary | ICD-10-CM | POA: Diagnosis not present

## 2015-12-27 ENCOUNTER — Other Ambulatory Visit: Payer: Self-pay | Admitting: Oncology

## 2015-12-27 DIAGNOSIS — C8203 Follicular lymphoma grade I, intra-abdominal lymph nodes: Secondary | ICD-10-CM

## 2015-12-27 DIAGNOSIS — Z85038 Personal history of other malignant neoplasm of large intestine: Secondary | ICD-10-CM

## 2015-12-27 DIAGNOSIS — M8580 Other specified disorders of bone density and structure, unspecified site: Secondary | ICD-10-CM

## 2015-12-27 DIAGNOSIS — M859 Disorder of bone density and structure, unspecified: Secondary | ICD-10-CM

## 2015-12-28 ENCOUNTER — Other Ambulatory Visit (HOSPITAL_BASED_OUTPATIENT_CLINIC_OR_DEPARTMENT_OTHER): Payer: PPO

## 2015-12-28 ENCOUNTER — Telehealth: Payer: Self-pay | Admitting: Oncology

## 2015-12-28 ENCOUNTER — Ambulatory Visit (HOSPITAL_BASED_OUTPATIENT_CLINIC_OR_DEPARTMENT_OTHER): Payer: PPO | Admitting: Oncology

## 2015-12-28 ENCOUNTER — Telehealth: Payer: Self-pay

## 2015-12-28 VITALS — BP 147/63 | HR 78 | Temp 98.0°F | Resp 18 | Ht 69.0 in | Wt 240.7 lb

## 2015-12-28 DIAGNOSIS — Z85038 Personal history of other malignant neoplasm of large intestine: Secondary | ICD-10-CM

## 2015-12-28 DIAGNOSIS — M859 Disorder of bone density and structure, unspecified: Secondary | ICD-10-CM

## 2015-12-28 DIAGNOSIS — C8203 Follicular lymphoma grade I, intra-abdominal lymph nodes: Secondary | ICD-10-CM

## 2015-12-28 DIAGNOSIS — C8290 Follicular lymphoma, unspecified, unspecified site: Secondary | ICD-10-CM | POA: Diagnosis not present

## 2015-12-28 DIAGNOSIS — E119 Type 2 diabetes mellitus without complications: Secondary | ICD-10-CM

## 2015-12-28 DIAGNOSIS — Z862 Personal history of diseases of the blood and blood-forming organs and certain disorders involving the immune mechanism: Secondary | ICD-10-CM | POA: Diagnosis not present

## 2015-12-28 DIAGNOSIS — D509 Iron deficiency anemia, unspecified: Secondary | ICD-10-CM

## 2015-12-28 DIAGNOSIS — E559 Vitamin D deficiency, unspecified: Secondary | ICD-10-CM

## 2015-12-28 DIAGNOSIS — D500 Iron deficiency anemia secondary to blood loss (chronic): Secondary | ICD-10-CM

## 2015-12-28 DIAGNOSIS — M8580 Other specified disorders of bone density and structure, unspecified site: Secondary | ICD-10-CM

## 2015-12-28 LAB — COMPREHENSIVE METABOLIC PANEL
ALBUMIN: 3.6 g/dL (ref 3.5–5.0)
ALT: 18 U/L (ref 0–55)
AST: 16 U/L (ref 5–34)
Alkaline Phosphatase: 66 U/L (ref 40–150)
Anion Gap: 9 mEq/L (ref 3–11)
BUN: 19.9 mg/dL (ref 7.0–26.0)
CHLORIDE: 111 meq/L — AB (ref 98–109)
CO2: 20 mEq/L — ABNORMAL LOW (ref 22–29)
Calcium: 9.2 mg/dL (ref 8.4–10.4)
Creatinine: 1.1 mg/dL (ref 0.7–1.3)
EGFR: 70 mL/min/{1.73_m2} — ABNORMAL LOW (ref >90)
GLUCOSE: 196 mg/dL — AB (ref 70–140)
POTASSIUM: 4.6 meq/L (ref 3.5–5.1)
SODIUM: 139 meq/L (ref 136–145)
Total Bilirubin: 0.3 mg/dL (ref 0.20–1.20)
Total Protein: 6.8 g/dL (ref 6.4–8.3)

## 2015-12-28 LAB — CBC WITH DIFFERENTIAL/PLATELET
BASO%: 0.3 % (ref 0.0–2.0)
BASOS ABS: 0 10*3/uL (ref 0.0–0.1)
EOS%: 0.7 % (ref 0.0–7.0)
Eosinophils Absolute: 0.1 10*3/uL (ref 0.0–0.5)
HCT: 39.5 % (ref 38.4–49.9)
HGB: 12.5 g/dL — ABNORMAL LOW (ref 13.0–17.1)
LYMPH%: 21.4 % (ref 14.0–49.0)
MCH: 26.7 pg — AB (ref 27.2–33.4)
MCHC: 31.6 g/dL — AB (ref 32.0–36.0)
MCV: 84.4 fL (ref 79.3–98.0)
MONO#: 0.7 10*3/uL (ref 0.1–0.9)
MONO%: 7.5 % (ref 0.0–14.0)
NEUT#: 6.1 10*3/uL (ref 1.5–6.5)
NEUT%: 70.1 % (ref 39.0–75.0)
Platelets: 221 10*3/uL (ref 140–400)
RBC: 4.68 10*6/uL (ref 4.20–5.82)
RDW: 15.4 % — AB (ref 11.0–14.6)
WBC: 8.6 10*3/uL (ref 4.0–10.3)
lymph#: 1.9 10*3/uL (ref 0.9–3.3)

## 2015-12-28 LAB — IRON AND TIBC
%SAT: 12 % — ABNORMAL LOW (ref 20–55)
IRON: 41 ug/dL — AB (ref 42–163)
TIBC: 334 ug/dL (ref 202–409)
UIBC: 293 ug/dL (ref 117–376)

## 2015-12-28 LAB — RETICULOCYTES (CHCC)
IMMATURE RETIC FRACT: 12.7 % — AB (ref 3.00–10.60)
RBC: 4.65 10*6/uL (ref 4.20–5.82)
RETIC %: 1.07 % (ref 0.80–1.80)
RETIC CT ABS: 49.76 10*3/uL (ref 34.80–93.90)

## 2015-12-28 LAB — CEA (IN HOUSE-CHCC): CEA (CHCC-In House): 2.16 ng/mL (ref 0.00–5.00)

## 2015-12-28 LAB — FERRITIN: Ferritin: 9 ng/ml — ABNORMAL LOW (ref 22–316)

## 2015-12-28 LAB — LACTATE DEHYDROGENASE: LDH: 135 U/L (ref 125–245)

## 2015-12-28 NOTE — Telephone Encounter (Signed)
Appointments scheduled per 12/21 LOS. Patient given AVS report and calendars with future scheduled appointments.  °
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
## 2015-12-28 NOTE — Telephone Encounter (Signed)
Faxed ROI to [facility] for Ct done in about October

## 2015-12-28 NOTE — Telephone Encounter (Signed)
Received CT report, copy to HIM, copy to Dr [name]

## 2015-12-29 DIAGNOSIS — M5124 Other intervertebral disc displacement, thoracic region: Secondary | ICD-10-CM | POA: Diagnosis not present

## 2015-12-29 DIAGNOSIS — M9901 Segmental and somatic dysfunction of cervical region: Secondary | ICD-10-CM | POA: Diagnosis not present

## 2015-12-29 DIAGNOSIS — M5136 Other intervertebral disc degeneration, lumbar region: Secondary | ICD-10-CM | POA: Diagnosis not present

## 2015-12-29 DIAGNOSIS — M9903 Segmental and somatic dysfunction of lumbar region: Secondary | ICD-10-CM | POA: Diagnosis not present

## 2015-12-29 DIAGNOSIS — M5137 Other intervertebral disc degeneration, lumbosacral region: Secondary | ICD-10-CM | POA: Diagnosis not present

## 2015-12-29 DIAGNOSIS — M9905 Segmental and somatic dysfunction of pelvic region: Secondary | ICD-10-CM | POA: Diagnosis not present

## 2015-12-29 LAB — HAPTOGLOBIN: HAPTOGLOBIN: 224 mg/dL — AB (ref 34–200)

## 2015-12-29 LAB — VITAMIN D 25 HYDROXY (VIT D DEFICIENCY, FRACTURES): Vitamin D, 25-Hydroxy: 23.9 ng/mL — ABNORMAL LOW (ref 30.0–100.0)

## 2016-01-02 DIAGNOSIS — M9903 Segmental and somatic dysfunction of lumbar region: Secondary | ICD-10-CM | POA: Diagnosis not present

## 2016-01-02 DIAGNOSIS — M9905 Segmental and somatic dysfunction of pelvic region: Secondary | ICD-10-CM | POA: Diagnosis not present

## 2016-01-02 DIAGNOSIS — M5136 Other intervertebral disc degeneration, lumbar region: Secondary | ICD-10-CM | POA: Diagnosis not present

## 2016-01-02 DIAGNOSIS — M5124 Other intervertebral disc displacement, thoracic region: Secondary | ICD-10-CM | POA: Diagnosis not present

## 2016-01-02 DIAGNOSIS — M9901 Segmental and somatic dysfunction of cervical region: Secondary | ICD-10-CM | POA: Diagnosis not present

## 2016-01-02 DIAGNOSIS — M5137 Other intervertebral disc degeneration, lumbosacral region: Secondary | ICD-10-CM | POA: Diagnosis not present

## 2016-01-09 DIAGNOSIS — M5137 Other intervertebral disc degeneration, lumbosacral region: Secondary | ICD-10-CM | POA: Diagnosis not present

## 2016-01-09 DIAGNOSIS — M5136 Other intervertebral disc degeneration, lumbar region: Secondary | ICD-10-CM | POA: Diagnosis not present

## 2016-01-09 DIAGNOSIS — M9901 Segmental and somatic dysfunction of cervical region: Secondary | ICD-10-CM | POA: Diagnosis not present

## 2016-01-09 DIAGNOSIS — M5124 Other intervertebral disc displacement, thoracic region: Secondary | ICD-10-CM | POA: Diagnosis not present

## 2016-01-09 DIAGNOSIS — M9905 Segmental and somatic dysfunction of pelvic region: Secondary | ICD-10-CM | POA: Diagnosis not present

## 2016-01-09 DIAGNOSIS — M1712 Unilateral primary osteoarthritis, left knee: Secondary | ICD-10-CM | POA: Diagnosis not present

## 2016-01-09 DIAGNOSIS — M9903 Segmental and somatic dysfunction of lumbar region: Secondary | ICD-10-CM | POA: Diagnosis not present

## 2016-01-09 DIAGNOSIS — M25562 Pain in left knee: Secondary | ICD-10-CM | POA: Diagnosis not present

## 2016-01-09 DIAGNOSIS — H401122 Primary open-angle glaucoma, left eye, moderate stage: Secondary | ICD-10-CM | POA: Diagnosis not present

## 2016-01-10 ENCOUNTER — Encounter: Payer: Self-pay | Admitting: Oncology

## 2016-01-10 DIAGNOSIS — E559 Vitamin D deficiency, unspecified: Secondary | ICD-10-CM | POA: Insufficient documentation

## 2016-01-15 ENCOUNTER — Telehealth: Payer: Self-pay | Admitting: *Deleted

## 2016-01-15 DIAGNOSIS — M1712 Unilateral primary osteoarthritis, left knee: Secondary | ICD-10-CM | POA: Diagnosis not present

## 2016-01-15 NOTE — Telephone Encounter (Signed)
Notified pt of Dr. Mariana Kaufman recommendations below. He agrees to resume daily iron tablet. Pt stated he will complete hemoccult cards at Dr. Vevelyn Francois office. Informed him he will need follow up with Dr. Amedeo Plenty if hemoccult positive. He voiced understanding. Informed pt of low VitD level, he reports he currently takes D3 2,000 units QHS. Instructed him to continue that and follow up with PCP for any further recommendations about Vit D level.  Pt stated several times he is very appreciative of the care from Dr. [name].

## 2016-01-15 NOTE — Telephone Encounter (Signed)
-----   Message from Baruch Merl, RN sent at 01/15/2016  5:03 PM EST ----- Regarding: FW: lab results   ----- Message ----- From: Gordy Levan, MD Sent: 01/10/2016  10:40 PM To: Baruch Merl, RN, Janace Hoard, RN Subject: lab results                                    Please let patient know 1.iron is low again. Please have him resume previous iron, best absorption on empty stomach with Vit C (or OJ if his diabetes allows). Needs to take iron daily from now until he sees Dr Lysle Rubens [phone], to recheck then 2.needs to do hemoccult cards, either from this office in next 2-3 weeks or Dr Lysle Rubens. If + needs to see Dr Teena Irani 3.Vit D level is low. Suggest he start Vit D ~ 800 units daily until he sees Dr Lysle Rubens, discuss with him then. 4. CT report from [facility] showed no enlarged lymph nodes or concerns from standpoint of the low grade lymphoma. It had no findings that would be concerning for possible jaundice, + labs bilirubin was not elevated, nothing else that I can tell for possible jaundice. The CT had nothing on outside of bowel or liver etc of concern from the colon cancer history.  Tell him that I have sent all information to Drs Lysle Rubens and Amedeo Plenty Thank you

## 2016-01-19 DIAGNOSIS — M9905 Segmental and somatic dysfunction of pelvic region: Secondary | ICD-10-CM | POA: Diagnosis not present

## 2016-01-19 DIAGNOSIS — M5136 Other intervertebral disc degeneration, lumbar region: Secondary | ICD-10-CM | POA: Diagnosis not present

## 2016-01-19 DIAGNOSIS — M9903 Segmental and somatic dysfunction of lumbar region: Secondary | ICD-10-CM | POA: Diagnosis not present

## 2016-01-19 DIAGNOSIS — M5137 Other intervertebral disc degeneration, lumbosacral region: Secondary | ICD-10-CM | POA: Diagnosis not present

## 2016-01-19 DIAGNOSIS — M9901 Segmental and somatic dysfunction of cervical region: Secondary | ICD-10-CM | POA: Diagnosis not present

## 2016-01-19 DIAGNOSIS — M5124 Other intervertebral disc displacement, thoracic region: Secondary | ICD-10-CM | POA: Diagnosis not present

## 2016-01-22 DIAGNOSIS — M25562 Pain in left knee: Secondary | ICD-10-CM | POA: Diagnosis not present

## 2016-02-09 DIAGNOSIS — M5124 Other intervertebral disc displacement, thoracic region: Secondary | ICD-10-CM | POA: Diagnosis not present

## 2016-02-09 DIAGNOSIS — M9905 Segmental and somatic dysfunction of pelvic region: Secondary | ICD-10-CM | POA: Diagnosis not present

## 2016-02-09 DIAGNOSIS — M9901 Segmental and somatic dysfunction of cervical region: Secondary | ICD-10-CM | POA: Diagnosis not present

## 2016-02-09 DIAGNOSIS — M5136 Other intervertebral disc degeneration, lumbar region: Secondary | ICD-10-CM | POA: Diagnosis not present

## 2016-02-09 DIAGNOSIS — M9903 Segmental and somatic dysfunction of lumbar region: Secondary | ICD-10-CM | POA: Diagnosis not present

## 2016-02-09 DIAGNOSIS — M5137 Other intervertebral disc degeneration, lumbosacral region: Secondary | ICD-10-CM | POA: Diagnosis not present

## 2016-02-22 DIAGNOSIS — J111 Influenza due to unidentified influenza virus with other respiratory manifestations: Secondary | ICD-10-CM | POA: Diagnosis not present

## 2016-02-22 DIAGNOSIS — R05 Cough: Secondary | ICD-10-CM | POA: Diagnosis not present

## 2016-03-08 DIAGNOSIS — M9905 Segmental and somatic dysfunction of pelvic region: Secondary | ICD-10-CM | POA: Diagnosis not present

## 2016-03-08 DIAGNOSIS — M9901 Segmental and somatic dysfunction of cervical region: Secondary | ICD-10-CM | POA: Diagnosis not present

## 2016-03-08 DIAGNOSIS — M9903 Segmental and somatic dysfunction of lumbar region: Secondary | ICD-10-CM | POA: Diagnosis not present

## 2016-03-08 DIAGNOSIS — M5136 Other intervertebral disc degeneration, lumbar region: Secondary | ICD-10-CM | POA: Diagnosis not present

## 2016-03-08 DIAGNOSIS — M5137 Other intervertebral disc degeneration, lumbosacral region: Secondary | ICD-10-CM | POA: Diagnosis not present

## 2016-03-08 DIAGNOSIS — M5124 Other intervertebral disc displacement, thoracic region: Secondary | ICD-10-CM | POA: Diagnosis not present

## 2016-03-26 DIAGNOSIS — E119 Type 2 diabetes mellitus without complications: Secondary | ICD-10-CM | POA: Diagnosis not present

## 2016-03-26 DIAGNOSIS — H35372 Puckering of macula, left eye: Secondary | ICD-10-CM | POA: Diagnosis not present

## 2016-03-26 DIAGNOSIS — H353122 Nonexudative age-related macular degeneration, left eye, intermediate dry stage: Secondary | ICD-10-CM | POA: Diagnosis not present

## 2016-04-15 DIAGNOSIS — M9901 Segmental and somatic dysfunction of cervical region: Secondary | ICD-10-CM | POA: Diagnosis not present

## 2016-04-15 DIAGNOSIS — M9905 Segmental and somatic dysfunction of pelvic region: Secondary | ICD-10-CM | POA: Diagnosis not present

## 2016-04-15 DIAGNOSIS — M5137 Other intervertebral disc degeneration, lumbosacral region: Secondary | ICD-10-CM | POA: Diagnosis not present

## 2016-04-15 DIAGNOSIS — M5124 Other intervertebral disc displacement, thoracic region: Secondary | ICD-10-CM | POA: Diagnosis not present

## 2016-04-15 DIAGNOSIS — M9903 Segmental and somatic dysfunction of lumbar region: Secondary | ICD-10-CM | POA: Diagnosis not present

## 2016-04-15 DIAGNOSIS — M5136 Other intervertebral disc degeneration, lumbar region: Secondary | ICD-10-CM | POA: Diagnosis not present

## 2016-04-29 DIAGNOSIS — I2510 Atherosclerotic heart disease of native coronary artery without angina pectoris: Secondary | ICD-10-CM | POA: Diagnosis not present

## 2016-04-29 DIAGNOSIS — R05 Cough: Secondary | ICD-10-CM | POA: Diagnosis not present

## 2016-04-29 DIAGNOSIS — I10 Essential (primary) hypertension: Secondary | ICD-10-CM | POA: Diagnosis not present

## 2016-04-29 DIAGNOSIS — Z1389 Encounter for screening for other disorder: Secondary | ICD-10-CM | POA: Diagnosis not present

## 2016-04-29 DIAGNOSIS — C189 Malignant neoplasm of colon, unspecified: Secondary | ICD-10-CM | POA: Diagnosis not present

## 2016-04-29 DIAGNOSIS — Z7984 Long term (current) use of oral hypoglycemic drugs: Secondary | ICD-10-CM | POA: Diagnosis not present

## 2016-04-29 DIAGNOSIS — E1142 Type 2 diabetes mellitus with diabetic polyneuropathy: Secondary | ICD-10-CM | POA: Diagnosis not present

## 2016-04-29 DIAGNOSIS — E782 Mixed hyperlipidemia: Secondary | ICD-10-CM | POA: Diagnosis not present

## 2016-04-29 DIAGNOSIS — I252 Old myocardial infarction: Secondary | ICD-10-CM | POA: Diagnosis not present

## 2016-04-29 DIAGNOSIS — I739 Peripheral vascular disease, unspecified: Secondary | ICD-10-CM | POA: Diagnosis not present

## 2016-05-20 DIAGNOSIS — M5124 Other intervertebral disc displacement, thoracic region: Secondary | ICD-10-CM | POA: Diagnosis not present

## 2016-05-20 DIAGNOSIS — M5136 Other intervertebral disc degeneration, lumbar region: Secondary | ICD-10-CM | POA: Diagnosis not present

## 2016-05-20 DIAGNOSIS — M5137 Other intervertebral disc degeneration, lumbosacral region: Secondary | ICD-10-CM | POA: Diagnosis not present

## 2016-05-20 DIAGNOSIS — M9901 Segmental and somatic dysfunction of cervical region: Secondary | ICD-10-CM | POA: Diagnosis not present

## 2016-05-20 DIAGNOSIS — M9903 Segmental and somatic dysfunction of lumbar region: Secondary | ICD-10-CM | POA: Diagnosis not present

## 2016-05-20 DIAGNOSIS — M9905 Segmental and somatic dysfunction of pelvic region: Secondary | ICD-10-CM | POA: Diagnosis not present

## 2016-05-29 DIAGNOSIS — I10 Essential (primary) hypertension: Secondary | ICD-10-CM | POA: Diagnosis not present

## 2016-06-07 DIAGNOSIS — L723 Sebaceous cyst: Secondary | ICD-10-CM | POA: Diagnosis not present

## 2016-06-24 DIAGNOSIS — M5124 Other intervertebral disc displacement, thoracic region: Secondary | ICD-10-CM | POA: Diagnosis not present

## 2016-06-24 DIAGNOSIS — M9901 Segmental and somatic dysfunction of cervical region: Secondary | ICD-10-CM | POA: Diagnosis not present

## 2016-06-24 DIAGNOSIS — M5136 Other intervertebral disc degeneration, lumbar region: Secondary | ICD-10-CM | POA: Diagnosis not present

## 2016-06-24 DIAGNOSIS — M5137 Other intervertebral disc degeneration, lumbosacral region: Secondary | ICD-10-CM | POA: Diagnosis not present

## 2016-06-24 DIAGNOSIS — M9905 Segmental and somatic dysfunction of pelvic region: Secondary | ICD-10-CM | POA: Diagnosis not present

## 2016-06-24 DIAGNOSIS — M9903 Segmental and somatic dysfunction of lumbar region: Secondary | ICD-10-CM | POA: Diagnosis not present

## 2016-06-27 ENCOUNTER — Ambulatory Visit: Payer: PPO | Admitting: Hematology

## 2016-06-27 ENCOUNTER — Other Ambulatory Visit: Payer: PPO

## 2016-07-26 ENCOUNTER — Other Ambulatory Visit: Payer: Self-pay | Admitting: *Deleted

## 2016-07-26 ENCOUNTER — Telehealth: Payer: Self-pay | Admitting: Interventional Cardiology

## 2016-07-26 DIAGNOSIS — C8203 Follicular lymphoma grade I, intra-abdominal lymph nodes: Secondary | ICD-10-CM

## 2016-07-26 NOTE — Telephone Encounter (Signed)
Spoke with pt and he states that he has been out of town for a week and ankles and feet are terribly swollen up into his calves.  Asked about salt intake.  Pt states they ate out a lot while on vacation and he's been eating a lot of watermelon and cantaloupe lately and putting a lot of salt on it.  Slightly SOB.  No compression stockings.  Advised pt to avoid salt, increase water intake to help flush out salt, elevate legs and try compression stockings.  Advised if anything worsens to call on Monday.  Will route to Dr. Tamala Julian to see if further recommendations.  Pt appreciative for call.

## 2016-07-26 NOTE — Telephone Encounter (Signed)
New Message  Pt call requesting to speak with RN about getting an appt with Dr. Tamala Julian as soon as possible. Pt states his feet and ankles are swollen he can barely bend his toes. Please call back to discuss

## 2016-07-29 ENCOUNTER — Ambulatory Visit (HOSPITAL_BASED_OUTPATIENT_CLINIC_OR_DEPARTMENT_OTHER): Payer: PPO | Admitting: Hematology

## 2016-07-29 ENCOUNTER — Telehealth: Payer: Self-pay | Admitting: Hematology

## 2016-07-29 ENCOUNTER — Encounter: Payer: Self-pay | Admitting: Hematology

## 2016-07-29 ENCOUNTER — Other Ambulatory Visit: Payer: Self-pay | Admitting: *Deleted

## 2016-07-29 ENCOUNTER — Other Ambulatory Visit (HOSPITAL_BASED_OUTPATIENT_CLINIC_OR_DEPARTMENT_OTHER): Payer: PPO

## 2016-07-29 VITALS — BP 146/69 | HR 58 | Temp 97.6°F | Resp 18 | Ht 69.0 in | Wt 250.6 lb

## 2016-07-29 DIAGNOSIS — Z862 Personal history of diseases of the blood and blood-forming organs and certain disorders involving the immune mechanism: Secondary | ICD-10-CM | POA: Diagnosis not present

## 2016-07-29 DIAGNOSIS — Z86010 Personal history of colonic polyps: Secondary | ICD-10-CM

## 2016-07-29 DIAGNOSIS — C8290 Follicular lymphoma, unspecified, unspecified site: Secondary | ICD-10-CM

## 2016-07-29 DIAGNOSIS — K625 Hemorrhage of anus and rectum: Secondary | ICD-10-CM | POA: Diagnosis not present

## 2016-07-29 DIAGNOSIS — C8203 Follicular lymphoma grade I, intra-abdominal lymph nodes: Secondary | ICD-10-CM

## 2016-07-29 DIAGNOSIS — D500 Iron deficiency anemia secondary to blood loss (chronic): Secondary | ICD-10-CM

## 2016-07-29 DIAGNOSIS — Z85038 Personal history of other malignant neoplasm of large intestine: Secondary | ICD-10-CM

## 2016-07-29 LAB — COMPREHENSIVE METABOLIC PANEL
ALBUMIN: 3.8 g/dL (ref 3.5–5.0)
ALK PHOS: 51 U/L (ref 40–150)
ALT: 25 U/L (ref 0–55)
AST: 20 U/L (ref 5–34)
Anion Gap: 8 mEq/L (ref 3–11)
BILIRUBIN TOTAL: 0.44 mg/dL (ref 0.20–1.20)
BUN: 21 mg/dL (ref 7.0–26.0)
CALCIUM: 9.8 mg/dL (ref 8.4–10.4)
CO2: 25 mEq/L (ref 22–29)
CREATININE: 1.2 mg/dL (ref 0.7–1.3)
Chloride: 106 mEq/L (ref 98–109)
EGFR: 58 mL/min/{1.73_m2} — ABNORMAL LOW (ref >90)
GLUCOSE: 116 mg/dL (ref 70–140)
Potassium: 5 mEq/L (ref 3.5–5.1)
SODIUM: 139 meq/L (ref 136–145)
TOTAL PROTEIN: 6.9 g/dL (ref 6.4–8.3)

## 2016-07-29 LAB — CBC WITH DIFFERENTIAL/PLATELET
BASO%: 0.7 % (ref 0.0–2.0)
BASOS ABS: 0 10*3/uL (ref 0.0–0.1)
EOS ABS: 0.1 10*3/uL (ref 0.0–0.5)
EOS%: 0.9 % (ref 0.0–7.0)
HEMATOCRIT: 38.1 % — AB (ref 38.4–49.9)
HEMOGLOBIN: 12.3 g/dL — AB (ref 13.0–17.1)
LYMPH#: 1.3 10*3/uL (ref 0.9–3.3)
LYMPH%: 20.6 % (ref 14.0–49.0)
MCH: 28.1 pg (ref 27.2–33.4)
MCHC: 32.3 g/dL (ref 32.0–36.0)
MCV: 87 fL (ref 79.3–98.0)
MONO#: 0.7 10*3/uL (ref 0.1–0.9)
MONO%: 11.3 % (ref 0.0–14.0)
NEUT%: 66.5 % (ref 39.0–75.0)
NEUTROS ABS: 4.1 10*3/uL (ref 1.5–6.5)
Platelets: 184 10*3/uL (ref 140–400)
RBC: 4.39 10*6/uL (ref 4.20–5.82)
RDW: 17.2 % — AB (ref 11.0–14.6)
WBC: 6.1 10*3/uL (ref 4.0–10.3)

## 2016-07-29 LAB — FERRITIN: Ferritin: 13 ng/ml — ABNORMAL LOW (ref 22–316)

## 2016-07-29 LAB — LACTATE DEHYDROGENASE: LDH: 153 U/L (ref 125–245)

## 2016-07-29 NOTE — Telephone Encounter (Signed)
Pt states swelling is much improved, almost resolved.  Legs were evenly swollen.  Denies pain.  Says only has minimal SOB when exerting but otherwise is fine.  Will route to Dr. Tamala Julian to make him aware.

## 2016-07-29 NOTE — Telephone Encounter (Signed)
Okay 

## 2016-07-29 NOTE — Patient Instructions (Signed)
Thank you for choosing [facility] to provide your oncology and hematology care.  To afford each patient quality time with our providers, please arrive 30 minutes before your scheduled appointment time.  If you arrive late for your appointment, you may be asked to reschedule.  We strive to give you quality time with our providers, and arriving late affects you and other patients whose appointments are after yours.  If you are a no show for multiple scheduled visits, you may be dismissed from the clinic at the providers discretion.   Again, thank you for choosing [facility], our hope is that these requests will decrease the amount of time that you wait before being seen by our physicians.  ______________________________________________________________________ Should you have questions after your visit to the [facility], please contact our office at [phone] between the hours of 8:30 and 4:30 p.m.    Voicemails left after 4:30p.m will not be returned until the following business day.   For prescription refill requests, please have your pharmacy contact us directly.  Please also try to allow 48 hours for prescription requests.   Please contact the scheduling department for questions regarding scheduling.  For scheduling of procedures such as PET scans, CT scans, MRI, Ultrasound, etc please contact central scheduling at [phone].   Resources For Cancer Patients and Caregivers:  American Cancer Society:  800-227-2345  Can help patients locate various types of support and financial assistance Cancer Care: 1-800-813-HOPE (4673) Provides financial assistance, online support groups, medication/co-pay assistance.   [place] DSS:  336-641-3447 Where to apply for food stamps, Medicaid, and utility assistance Medicare Rights Center: 800-333-4114 Helps people with Medicare understand their rights and benefits, navigate the Medicare system, and secure the  quality healthcare they deserve SCAT: 336-333-6589 [place] [employer]'s shared-ride transportation service for eligible riders who have a disability that prevents them from riding the fixed route bus.   For additional information on assistance programs please contact our social worker:   Grier Hock/Abigail Elmore:  [phone] 

## 2016-07-29 NOTE — Telephone Encounter (Signed)
Gave patient avs report and appointments for January 2019. Central radiology will call re scan.  °

## 2016-07-29 NOTE — Telephone Encounter (Signed)
If the legs are swollen equally, I agree with the recommendations. If all leg is larger than the other, we need to rule out DVT. If no improvement in lower extremity swelling with elevation, decreased fluid intake, and some restriction he should come into be evaluated. If short of breath he should come into be evaluated.

## 2016-08-26 DIAGNOSIS — M5137 Other intervertebral disc degeneration, lumbosacral region: Secondary | ICD-10-CM | POA: Diagnosis not present

## 2016-08-26 DIAGNOSIS — M5124 Other intervertebral disc displacement, thoracic region: Secondary | ICD-10-CM | POA: Diagnosis not present

## 2016-08-26 DIAGNOSIS — M5136 Other intervertebral disc degeneration, lumbar region: Secondary | ICD-10-CM | POA: Diagnosis not present

## 2016-08-26 DIAGNOSIS — M9905 Segmental and somatic dysfunction of pelvic region: Secondary | ICD-10-CM | POA: Diagnosis not present

## 2016-08-26 DIAGNOSIS — M9901 Segmental and somatic dysfunction of cervical region: Secondary | ICD-10-CM | POA: Diagnosis not present

## 2016-08-26 DIAGNOSIS — M9903 Segmental and somatic dysfunction of lumbar region: Secondary | ICD-10-CM | POA: Diagnosis not present

## 2016-08-26 NOTE — Progress Notes (Signed)
Marland Kitchen  HEMATOLOGY ONCOLOGY PROGRESS NOTE  Date of service: .07/29/2016  Patient Care Team: Wenda Low, MD as PCP - General (Internal Medicine)   CC: f/u for  history of colon cancer  low grade follicular NHL   INTERVAL HISTORY:  Patient is here for follow-up after his last clinic visit with Dr. [name] on 12/28/2015 for his history of colon cancer and low-grade follicular non-Hodgkin's lymphoma which has not required treatment. He has also had history of previous iron deficiency anemia thought to be from colonic polyps.  His last colonoscopy with Dr. Amedeo Plenty was on 10/10/2014 and he is due for another one. Notes mild intermittent rectal bleeding. Continues to be on aspirin and Plavix.  No abdominal pain. No chest pain. No shortness of breath. No fevers no chills no night sweats no unexpected acute weight loss.   REVIEW OF SYSTEMS:    10 Point review of systems of done and is negative except as noted above.  . Past Medical History:  Diagnosis Date  . Anemia, iron deficiency 01/26/2011  . Arthritis   . Asthma   . Cancer (HCC)    nhl, colon ca  . Carotid artery occlusion    right occlusion of carotid with moderate left plaque  . Coronary artery disease August 2005   MI with stent  . Diabetes mellitus   . Hyperlipidemia   . Hypertension   . Kidney stones 05/2010   lithotripsy  . Myocardial infarction ([place])     . Past Surgical History:  Procedure Laterality Date  . CARPAL TUNNEL RELEASE Left 05/08/2015   Procedure: CARPAL TUNNEL RELEASE;  Surgeon: Charlotte Crumb, MD;  Location: [facility];  Service: Orthopedics;  Laterality: Left;  . COLON SURGERY    . COLONOSCOPY    . CORONARY STENT PLACEMENT    . HERNIA REPAIR    . OPEN REDUCTION INTERNAL FIXATION (ORIF) DISTAL RADIAL FRACTURE Left 05/08/2015   Procedure: OPEN REDUCTION INTERNAL FIXATION (ORIF) DISTAL RADIAL FRACTURE, CARPAL TUNNEL RELEASE;  Surgeon: Charlotte Crumb, MD;  Location: [facility];  Service: Orthopedics;   Laterality: Left;  . REPAIR OF COMPLEX TRACTION RETINAL DETACHMENT      . Social History  Substance Use Topics  . Smoking status: Former Smoker    Packs/day: 1.00    Years: 20.00    Quit date: 01/08/1980  . Smokeless tobacco: Never Used  . Alcohol use No    ALLERGIES:  is allergic to iodine.  MEDICATIONS:  Current Outpatient Prescriptions  Medication Sig Dispense Refill  . aspirin 81 MG tablet Take 81 mg by mouth daily.      Marland Kitchen atorvastatin (LIPITOR) 40 MG tablet Take 40 mg by mouth daily.      . clopidogrel (PLAVIX) 75 MG tablet Take 75 mg by mouth daily.      . Coenzyme Q10 (CO Q 10 PO) Take 1 capsule by mouth daily.     . dorzolamide-timolol (COSOPT) 22.3-6.8 MG/ML ophthalmic solution Place 1 drop into both eyes 2 (two) times daily.    . fentaNYL (SUBLIMAZE) 100 MCG/2ML injection Inject 200 mcg into the vein once.    Marland Kitchen glucosamine-chondroitin 500-400 MG tablet Take 1 tablet by mouth daily.     Marland Kitchen lisinopril (PRINIVIL,ZESTRIL) 5 MG tablet Take 5 mg by mouth daily.     . Lutein 10 MG TABS Take 1 tablet by mouth daily.     Marland Kitchen LYCOPENE PO Take 1 tablet by mouth daily.     . metFORMIN (GLUCOPHAGE) 500 MG tablet Take  1,000 mg by mouth 2 (two) times daily with a meal.    . metoprolol (LOPRESSOR) 50 MG tablet Take 25 mg by mouth 2 (two) times daily.    . nitroGLYCERIN (NITROSTAT) 0.4 MG SL tablet Place 1 tablet (0.4 mg total) under the tongue every 5 (five) minutes as needed for chest pain. 25 tablet 3  . Omega-3 Fatty Acids (FISH OIL) 1000 MG CAPS Take 1 capsule by mouth daily.    Marland Kitchen oxyCODONE-acetaminophen (ROXICET) 5-325 MG tablet Take 1 tablet by mouth every 4 (four) hours as needed for severe pain. 30 tablet 0  . pantoprazole (PROTONIX) 40 MG tablet Take 40 mg by mouth daily.     . sitaGLIPtin (JANUVIA) 100 MG tablet Take 100 mg by mouth daily.       No current facility-administered medications for this visit.     PHYSICAL EXAMINATION: ECOG PERFORMANCE STATUS: 2 - Symptomatic,  <50% confined to bed  . Vitals:   07/29/16 1322  BP: (!) 146/69  Pulse: (!) 58  Resp: 18  Temp: 97.6 F (36.4 C)  SpO2: 98%    Filed Weights   07/29/16 1322  Weight: 250 lb 9.6 oz (113.7 kg)   .Body mass index is 37.01 kg/m.  GENERAL:alert, in no acute distress and comfortable SKIN: no acute rashes, no significant lesions EYES: conjunctiva are pink and non-injected, sclera anicteric OROPHARYNX: MMM, no exudates, no oropharyngeal erythema or ulceration NECK: supple, no JVD LYMPH:  no palpable lymphadenopathy in the cervical, axillary or inguinal regions LUNGS: clear to auscultation b/l with normal respiratory effort HEART: regular rate & rhythm ABDOMEN:  normoactive bowel sounds , non tender, not distended. No hepato-splenomegaly. Extremity: no pedal edema PSYCH: alert & oriented x 3 with fluent speech NEURO: no focal motor/sensory deficits  LABORATORY DATA:   I have reviewed the data as listed  . CBC Latest Ref Rng & Units 07/29/2016 12/28/2015 01/05/2015  WBC 4.0 - 10.3 10e3/uL 6.1 8.6 5.6  Hemoglobin 13.0 - 17.1 g/dL 12.3(L) 12.5(L) 13.2  Hematocrit 38.4 - 49.9 % 38.1(L) 39.5 42.4  Platelets 140 - 400 10e3/uL 184 221 199   . CBC    Component Value Date/Time   WBC 6.1 07/29/2016 1304   WBC 5.6 01/05/2015 0914   RBC 4.39 07/29/2016 1304   RBC 4.64 01/05/2015 0914   HGB 12.3 (L) 07/29/2016 1304   HCT 38.1 (L) 07/29/2016 1304   PLT 184 07/29/2016 1304   MCV 87.0 07/29/2016 1304   MCH 28.1 07/29/2016 1304   MCH 28.4 01/05/2015 0914   MCHC 32.3 07/29/2016 1304   MCHC 31.1 01/05/2015 0914   RDW 17.2 (H) 07/29/2016 1304   LYMPHSABS 1.3 07/29/2016 1304   MONOABS 0.7 07/29/2016 1304   EOSABS 0.1 07/29/2016 1304   BASOSABS 0.0 07/29/2016 1304    . CMP Latest Ref Rng & Units 07/29/2016 12/28/2015 01/05/2015  Glucose 70 - 140 mg/dl 116 196(H) 183(H)  BUN 7.0 - 26.0 mg/dL 21.0 19.9 20  Creatinine 0.7 - 1.3 mg/dL 1.2 1.1 0.88  Sodium 136 - 145 mEq/L 139 139  140  Potassium 3.5 - 5.1 mEq/L 5.0 4.6 4.7  Chloride 101 - 111 mmol/L - - 105  CO2 22 - 29 mEq/L 25 20(L) 23  Calcium 8.4 - 10.4 mg/dL 9.8 9.2 9.2  Total Protein 6.4 - 8.3 g/dL 6.9 6.8 7.0  Total Bilirubin 0.20 - 1.20 mg/dL 0.44 0.30 0.7  Alkaline Phos 40 - 150 U/L 51 66 61  AST 5 - 34  U/L 20 16 25   ALT 0 - 55 U/L 25 18 27    Component     Latest Ref Rng & Units 07/29/2016  LDH     125 - 245 U/L 153  Ferritin     22 - 316 ng/ml 13 (L)    RADIOGRAPHIC STUDIES: I have personally reviewed the radiological images as listed and agreed with the findings in the report. No results found.  ASSESSMENT & PLAN:   1.Follicular NHL Patient has never required treatment for this.  no obvious progression by CT [facility] 11-2015 Plan -Patient has no clinical or lab evidence of progression at this time. -No overtly palpable significantly enlarged peripheral lymphadenopathy. LDH level is within normal limits. No reported constitutional symptoms. No other bothersome focal symptoms are related to his for recurrent non-Hodgkin's lymphoma. -Repeat CT chest abdomen pelvis in 5-6 months.  2. Iron deficiency anemia previously related to GI bleeding from nonmalignant inflammatory anastomotic colon polyp.    3.remote history of synchronous colon cancers. Colonoscopy planned 06-2016 by Dr Candis Musa not been done. CEA level in 12/2015 WNL PLAN -patient will need f/u with Dr Amedeo Plenty for his scheduled colonoscopy that was planned in July 2018 - patient was recommended to f/u with PCP and Dr Amedeo Plenty regarding this. -he is still on dual antiplatelet therapy which increase his risk of chronic GI losses. -reasonable to consider PO Iron replacement with Iron polysaccharide 150mg  daily with PCP once colonoscopy/GI workup completed.  CT chest/abd/pelvis in 5.5 months RTC with Dr Irene Limbo in 6 months with labs   I spent 20 minutes counseling the patient face to face. The total time spent in the appointment was  25 minutes and more than 50% was on counseling and direct patient cares.    Sullivan Lone MD [place] AAHIVMS [facility] Hematology/Oncology Physician [facility]  (Office):       [phone] (Work cell):  [phone] (Fax):           [phone]

## 2016-09-16 DIAGNOSIS — H40013 Open angle with borderline findings, low risk, bilateral: Secondary | ICD-10-CM | POA: Diagnosis not present

## 2016-09-20 ENCOUNTER — Telehealth: Payer: Self-pay

## 2016-09-20 NOTE — Telephone Encounter (Signed)
About 3-4 weeks ago pt was given 2 bottles of contrast for CT. He is asking when CT is to be done. CT is due 12/29/16. Explained about 30 day window for PA for CT.  Requested of pt that he call the week after thanksgiving to ask about PA for his CT. He was agreeable.

## 2016-09-23 DIAGNOSIS — H409 Unspecified glaucoma: Secondary | ICD-10-CM | POA: Diagnosis not present

## 2016-09-23 DIAGNOSIS — E1142 Type 2 diabetes mellitus with diabetic polyneuropathy: Secondary | ICD-10-CM | POA: Diagnosis not present

## 2016-09-23 DIAGNOSIS — M1990 Unspecified osteoarthritis, unspecified site: Secondary | ICD-10-CM | POA: Diagnosis not present

## 2016-09-23 DIAGNOSIS — Z85038 Personal history of other malignant neoplasm of large intestine: Secondary | ICD-10-CM | POA: Diagnosis not present

## 2016-09-23 DIAGNOSIS — M9905 Segmental and somatic dysfunction of pelvic region: Secondary | ICD-10-CM | POA: Diagnosis not present

## 2016-09-23 DIAGNOSIS — E782 Mixed hyperlipidemia: Secondary | ICD-10-CM | POA: Diagnosis not present

## 2016-09-23 DIAGNOSIS — M5136 Other intervertebral disc degeneration, lumbar region: Secondary | ICD-10-CM | POA: Diagnosis not present

## 2016-09-23 DIAGNOSIS — I252 Old myocardial infarction: Secondary | ICD-10-CM | POA: Diagnosis not present

## 2016-09-23 DIAGNOSIS — N400 Enlarged prostate without lower urinary tract symptoms: Secondary | ICD-10-CM | POA: Diagnosis not present

## 2016-09-23 DIAGNOSIS — M5137 Other intervertebral disc degeneration, lumbosacral region: Secondary | ICD-10-CM | POA: Diagnosis not present

## 2016-09-23 DIAGNOSIS — I2510 Atherosclerotic heart disease of native coronary artery without angina pectoris: Secondary | ICD-10-CM | POA: Diagnosis not present

## 2016-09-23 DIAGNOSIS — C189 Malignant neoplasm of colon, unspecified: Secondary | ICD-10-CM | POA: Diagnosis not present

## 2016-09-23 DIAGNOSIS — M9903 Segmental and somatic dysfunction of lumbar region: Secondary | ICD-10-CM | POA: Diagnosis not present

## 2016-09-23 DIAGNOSIS — M9901 Segmental and somatic dysfunction of cervical region: Secondary | ICD-10-CM | POA: Diagnosis not present

## 2016-09-23 DIAGNOSIS — M5124 Other intervertebral disc displacement, thoracic region: Secondary | ICD-10-CM | POA: Diagnosis not present

## 2016-09-23 DIAGNOSIS — I10 Essential (primary) hypertension: Secondary | ICD-10-CM | POA: Diagnosis not present

## 2016-10-21 DIAGNOSIS — M5137 Other intervertebral disc degeneration, lumbosacral region: Secondary | ICD-10-CM | POA: Diagnosis not present

## 2016-10-21 DIAGNOSIS — M9903 Segmental and somatic dysfunction of lumbar region: Secondary | ICD-10-CM | POA: Diagnosis not present

## 2016-10-21 DIAGNOSIS — M9901 Segmental and somatic dysfunction of cervical region: Secondary | ICD-10-CM | POA: Diagnosis not present

## 2016-10-21 DIAGNOSIS — M9905 Segmental and somatic dysfunction of pelvic region: Secondary | ICD-10-CM | POA: Diagnosis not present

## 2016-10-21 DIAGNOSIS — M5124 Other intervertebral disc displacement, thoracic region: Secondary | ICD-10-CM | POA: Diagnosis not present

## 2016-10-21 DIAGNOSIS — M5136 Other intervertebral disc degeneration, lumbar region: Secondary | ICD-10-CM | POA: Diagnosis not present

## 2016-10-31 ENCOUNTER — Other Ambulatory Visit: Payer: Self-pay | Admitting: *Deleted

## 2016-10-31 DIAGNOSIS — I6523 Occlusion and stenosis of bilateral carotid arteries: Secondary | ICD-10-CM

## 2016-11-18 DIAGNOSIS — M9903 Segmental and somatic dysfunction of lumbar region: Secondary | ICD-10-CM | POA: Diagnosis not present

## 2016-11-18 DIAGNOSIS — M9905 Segmental and somatic dysfunction of pelvic region: Secondary | ICD-10-CM | POA: Diagnosis not present

## 2016-11-18 DIAGNOSIS — M5137 Other intervertebral disc degeneration, lumbosacral region: Secondary | ICD-10-CM | POA: Diagnosis not present

## 2016-11-18 DIAGNOSIS — M5136 Other intervertebral disc degeneration, lumbar region: Secondary | ICD-10-CM | POA: Diagnosis not present

## 2016-11-18 DIAGNOSIS — M5124 Other intervertebral disc displacement, thoracic region: Secondary | ICD-10-CM | POA: Diagnosis not present

## 2016-11-18 DIAGNOSIS — S138XXA Sprain of joints and ligaments of other parts of neck, initial encounter: Secondary | ICD-10-CM | POA: Diagnosis not present

## 2016-11-18 DIAGNOSIS — M9901 Segmental and somatic dysfunction of cervical region: Secondary | ICD-10-CM | POA: Diagnosis not present

## 2016-11-19 DIAGNOSIS — Z23 Encounter for immunization: Secondary | ICD-10-CM | POA: Diagnosis not present

## 2016-11-19 DIAGNOSIS — I2510 Atherosclerotic heart disease of native coronary artery without angina pectoris: Secondary | ICD-10-CM | POA: Diagnosis not present

## 2016-11-19 DIAGNOSIS — C189 Malignant neoplasm of colon, unspecified: Secondary | ICD-10-CM | POA: Diagnosis not present

## 2016-11-19 DIAGNOSIS — N400 Enlarged prostate without lower urinary tract symptoms: Secondary | ICD-10-CM | POA: Diagnosis not present

## 2016-11-19 DIAGNOSIS — Z1389 Encounter for screening for other disorder: Secondary | ICD-10-CM | POA: Diagnosis not present

## 2016-11-19 DIAGNOSIS — I10 Essential (primary) hypertension: Secondary | ICD-10-CM | POA: Diagnosis not present

## 2016-11-19 DIAGNOSIS — I779 Disorder of arteries and arterioles, unspecified: Secondary | ICD-10-CM | POA: Diagnosis not present

## 2016-11-19 DIAGNOSIS — I739 Peripheral vascular disease, unspecified: Secondary | ICD-10-CM | POA: Diagnosis not present

## 2016-11-19 DIAGNOSIS — Z0000 Encounter for general adult medical examination without abnormal findings: Secondary | ICD-10-CM | POA: Diagnosis not present

## 2016-11-19 DIAGNOSIS — E782 Mixed hyperlipidemia: Secondary | ICD-10-CM | POA: Diagnosis not present

## 2016-11-19 DIAGNOSIS — D649 Anemia, unspecified: Secondary | ICD-10-CM | POA: Diagnosis not present

## 2016-11-19 DIAGNOSIS — K219 Gastro-esophageal reflux disease without esophagitis: Secondary | ICD-10-CM | POA: Diagnosis not present

## 2016-11-19 DIAGNOSIS — E1142 Type 2 diabetes mellitus with diabetic polyneuropathy: Secondary | ICD-10-CM | POA: Diagnosis not present

## 2016-11-21 DIAGNOSIS — M9905 Segmental and somatic dysfunction of pelvic region: Secondary | ICD-10-CM | POA: Diagnosis not present

## 2016-11-21 DIAGNOSIS — S138XXA Sprain of joints and ligaments of other parts of neck, initial encounter: Secondary | ICD-10-CM | POA: Diagnosis not present

## 2016-11-21 DIAGNOSIS — M5124 Other intervertebral disc displacement, thoracic region: Secondary | ICD-10-CM | POA: Diagnosis not present

## 2016-11-21 DIAGNOSIS — M5136 Other intervertebral disc degeneration, lumbar region: Secondary | ICD-10-CM | POA: Diagnosis not present

## 2016-11-21 DIAGNOSIS — M5137 Other intervertebral disc degeneration, lumbosacral region: Secondary | ICD-10-CM | POA: Diagnosis not present

## 2016-11-21 DIAGNOSIS — M9903 Segmental and somatic dysfunction of lumbar region: Secondary | ICD-10-CM | POA: Diagnosis not present

## 2016-11-21 DIAGNOSIS — M9901 Segmental and somatic dysfunction of cervical region: Secondary | ICD-10-CM | POA: Diagnosis not present

## 2016-11-22 DIAGNOSIS — Z85038 Personal history of other malignant neoplasm of large intestine: Secondary | ICD-10-CM | POA: Diagnosis not present

## 2016-11-22 DIAGNOSIS — I779 Disorder of arteries and arterioles, unspecified: Secondary | ICD-10-CM | POA: Diagnosis not present

## 2016-11-27 ENCOUNTER — Ambulatory Visit (HOSPITAL_COMMUNITY)
Admission: RE | Admit: 2016-11-27 | Discharge: 2016-11-27 | Disposition: A | Discharge status: Home / Self Care | Payer: PPO | Source: Ambulatory Visit | Attending: Hematology | Admitting: Hematology

## 2016-11-27 ENCOUNTER — Telehealth: Payer: Self-pay | Admitting: *Deleted

## 2016-11-27 DIAGNOSIS — I2510 Atherosclerotic heart disease of native coronary artery without angina pectoris: Secondary | ICD-10-CM | POA: Diagnosis not present

## 2016-11-27 DIAGNOSIS — I700 Atherosclerosis of aorta: Secondary | ICD-10-CM | POA: Diagnosis not present

## 2016-11-27 DIAGNOSIS — R918 Other nonspecific abnormal finding of lung field: Secondary | ICD-10-CM | POA: Insufficient documentation

## 2016-11-27 DIAGNOSIS — Z9049 Acquired absence of other specified parts of digestive tract: Secondary | ICD-10-CM | POA: Diagnosis not present

## 2016-11-27 DIAGNOSIS — C8203 Follicular lymphoma grade I, intra-abdominal lymph nodes: Secondary | ICD-10-CM | POA: Diagnosis not present

## 2016-11-27 DIAGNOSIS — K635 Polyp of colon: Secondary | ICD-10-CM | POA: Insufficient documentation

## 2016-11-27 DIAGNOSIS — R1084 Generalized abdominal pain: Secondary | ICD-10-CM | POA: Diagnosis not present

## 2016-11-27 NOTE — Telephone Encounter (Signed)
"  I'm reading letter from insurance my test today are approved without contrast.  I was given two bottles of contrast to start drinking soon.  Has the wrong test been ordered/authorized."   Advised he  Must drink In order for abdominal ara to show up with CT scan.  Orders and appointment scheduled are without contrast.  Confirmed with CT Tedra Coupe) He will not receive IV contrast but must drink  Oral barium.  Patient asked if what they're looking for will be seen without contrast.  Varies dependng on what provider is looking for with this test.  Your provider and Radiologist will make sure the correct test is performed, Kidneys are functioning well and other issues that could interfere with testing or results.of a CT scans.   Denies further questions or needs at this time.  "I will begin to drink the barium in a few minutes."

## 2016-12-02 ENCOUNTER — Encounter: Payer: Self-pay | Admitting: Hematology

## 2016-12-02 ENCOUNTER — Other Ambulatory Visit: Payer: Self-pay | Admitting: *Deleted

## 2016-12-02 ENCOUNTER — Ambulatory Visit (HOSPITAL_BASED_OUTPATIENT_CLINIC_OR_DEPARTMENT_OTHER): Payer: PPO

## 2016-12-02 ENCOUNTER — Telehealth: Payer: Self-pay | Admitting: Hematology

## 2016-12-02 ENCOUNTER — Ambulatory Visit (HOSPITAL_BASED_OUTPATIENT_CLINIC_OR_DEPARTMENT_OTHER): Payer: PPO | Admitting: Hematology

## 2016-12-02 VITALS — BP 144/57 | HR 83 | Temp 98.3°F | Resp 18 | Ht 69.0 in | Wt 242.0 lb

## 2016-12-02 DIAGNOSIS — C8290 Follicular lymphoma, unspecified, unspecified site: Secondary | ICD-10-CM

## 2016-12-02 DIAGNOSIS — R197 Diarrhea, unspecified: Secondary | ICD-10-CM

## 2016-12-02 DIAGNOSIS — Z85038 Personal history of other malignant neoplasm of large intestine: Secondary | ICD-10-CM

## 2016-12-02 DIAGNOSIS — D500 Iron deficiency anemia secondary to blood loss (chronic): Secondary | ICD-10-CM

## 2016-12-02 DIAGNOSIS — C8203 Follicular lymphoma grade I, intra-abdominal lymph nodes: Secondary | ICD-10-CM

## 2016-12-02 LAB — CBC WITH DIFFERENTIAL/PLATELET
BASO%: 0.3 % (ref 0.0–2.0)
BASOS ABS: 0 10*3/uL (ref 0.0–0.1)
EOS%: 0.7 % (ref 0.0–7.0)
Eosinophils Absolute: 0.1 10*3/uL (ref 0.0–0.5)
HCT: 40.3 % (ref 38.4–49.9)
HGB: 12.9 g/dL — ABNORMAL LOW (ref 13.0–17.1)
LYMPH%: 19.1 % (ref 14.0–49.0)
MCH: 28.5 pg (ref 27.2–33.4)
MCHC: 32 g/dL (ref 32.0–36.0)
MCV: 89 fL (ref 79.3–98.0)
MONO#: 0.5 10*3/uL (ref 0.1–0.9)
MONO%: 7 % (ref 0.0–14.0)
NEUT#: 5.1 10*3/uL (ref 1.5–6.5)
NEUT%: 72.9 % (ref 39.0–75.0)
Platelets: 206 10*3/uL (ref 140–400)
RBC: 4.53 10*6/uL (ref 4.20–5.82)
RDW: 14.2 % (ref 11.0–14.6)
WBC: 7 10*3/uL (ref 4.0–10.3)
lymph#: 1.3 10*3/uL (ref 0.9–3.3)

## 2016-12-02 LAB — COMPREHENSIVE METABOLIC PANEL
ALT: 26 U/L (ref 0–55)
AST: 18 U/L (ref 5–34)
Albumin: 3.7 g/dL (ref 3.5–5.0)
Alkaline Phosphatase: 62 U/L (ref 40–150)
Anion Gap: 10 mEq/L (ref 3–11)
BUN: 22 mg/dL (ref 7.0–26.0)
CO2: 20 mEq/L — ABNORMAL LOW (ref 22–29)
Calcium: 9.3 mg/dL (ref 8.4–10.4)
Chloride: 110 mEq/L — ABNORMAL HIGH (ref 98–109)
Creatinine: 1.4 mg/dL — ABNORMAL HIGH (ref 0.7–1.3)
EGFR: 50 mL/min/{1.73_m2} — ABNORMAL LOW (ref >60)
Glucose: 175 mg/dl — ABNORMAL HIGH (ref 70–140)
Potassium: 4.7 mEq/L (ref 3.5–5.1)
Sodium: 140 mEq/L (ref 136–145)
Total Bilirubin: 0.41 mg/dL (ref 0.20–1.20)
Total Protein: 7 g/dL (ref 6.4–8.3)

## 2016-12-02 LAB — CEA (IN HOUSE-CHCC): CEA (CHCC-In House): 3.47 ng/mL (ref 0.00–5.00)

## 2016-12-02 LAB — LACTATE DEHYDROGENASE: LDH: 124 U/L — AB (ref 125–245)

## 2016-12-02 LAB — FERRITIN: Ferritin: 13 ng/ml — ABNORMAL LOW (ref 22–316)

## 2016-12-02 NOTE — Patient Instructions (Signed)
Thank you for choosing [facility] to provide your oncology and hematology care.  To afford each patient quality time with our providers, please arrive 30 minutes before your scheduled appointment time.  If you arrive late for your appointment, you may be asked to reschedule.  We strive to give you quality time with our providers, and arriving late affects you and other patients whose appointments are after yours.   If you are a no show for multiple scheduled visits, you may be dismissed from the clinic at the providers discretion.    Again, thank you for choosing [facility], our hope is that these requests will decrease the amount of time that you wait before being seen by our physicians.  ______________________________________________________________________  Should you have questions after your visit to the [facility], please contact our office at [phone] between the hours of 8:30 and 4:30 p.m.    Voicemails left after 4:30p.m will not be returned until the following business day.    For prescription refill requests, please have your pharmacy contact us directly.  Please also try to allow 48 hours for prescription requests.    Please contact the scheduling department for questions regarding scheduling.  For scheduling of procedures such as PET scans, CT scans, MRI, Ultrasound, etc please contact central scheduling at [phone].    Resources For Cancer Patients and Caregivers:   Oncolink.org:  A wonderful resource for patients and healthcare providers for information regarding your disease, ways to tract your treatment, what to expect, etc.     American Cancer Society:  800-227-2345  Can help patients locate various types of support and financial assistance  Cancer Care: 1-800-813-HOPE (4673) Provides financial assistance, online support groups, medication/co-pay assistance.    [place] DSS:  336-641-3447 Where to apply for food  stamps, Medicaid, and utility assistance  Medicare Rights Center: 800-333-4114 Helps people with Medicare understand their rights and benefits, navigate the Medicare system, and secure the quality healthcare they deserve  SCAT: 336-333-6589 [place] [employer]'s shared-ride transportation service for eligible riders who have a disability that prevents them from riding the fixed route bus.    For additional information on assistance programs please contact our social worker:   Grier Hock/Abigail Elmore:  [phone]            

## 2016-12-02 NOTE — Progress Notes (Signed)
Marland Kitchen  HEMATOLOGY ONCOLOGY PROGRESS NOTE  Date of service: 12/02/16  Patient Care Team: Wenda Low, MD as PCP - General (Internal Medicine)   CC: f/u for  history of colon cancer  low grade follicular NHL   INTERVAL HISTORY:  Patient is here for follow-up  for his history of colon cancer and low-grade follicular non-Hodgkin's lymphoma which has not required treatment. He has also had history of previous iron deficiency anemia thought to be from colonic polyps.   Of note since his last visit, he has had CT ABD on 11/27/2016 with results showing: IMPRESSION: 1. Status post right hemicolectomy with circumferential wall thickening in the neo terminal ileum extending into the ileocolic anastomosis and having asymmetric wall thickening in the proximal post anastomotic colon. There is pericolonic edema/inflammation tracking down the remaining segment of the colon to the rectum with interval increase in size of previously seen mesenteric lymph nodes adjacent to the anastomosis and interval development of multiple tiny perirectal nodules. These imaging features may be related to inflammatory bowel disease although infectious etiology neoplasm could have this appearance. 2. Apparent wall thickening in the rectum. 3. Potential pedunculated polyp in the neo terminal ileum. 4.  Aortic Atherosclerois (ICD10-170.0) 5. Multiple tiny pulmonary nodules in the lower lungs. Many of these were seen on the previous abdomen and pelvis CT and are stable in the interval. One particular 3 mm nodule in the left lower lobe was not included on the prior study and attention to this on follow-up imaging is recommended.  He states he is doing well overall. He is scheduled for a colonoscopy on 12/24/2016. He reports he's had intermittent loose stools since a trip to [place] and usually this happens 8 times a day. He reports bright red blood in the stool once without any mucus or cramping.   On review of systems, pt reports  intermittent loose stools/diarrhea, fatigue, weight loss likely due to diarrhea, blood in the stool (once) and denies cramping, fever, chills, night sweats and any other accompanying symptoms.   REVIEW OF SYSTEMS:    10 Point review of systems of done and is negative except as noted above.  . Past Medical History:  Diagnosis Date  . Anemia, iron deficiency 01/26/2011  . Arthritis   . Asthma   . Cancer (HCC)    nhl, colon ca  . Carotid artery occlusion    right occlusion of carotid with moderate left plaque  . Coronary artery disease August 2005   MI with stent  . Diabetes mellitus   . Hyperlipidemia   . Hypertension   . Kidney stones 05/2010   lithotripsy  . Myocardial infarction ([place])     . Past Surgical History:  Procedure Laterality Date  . CARPAL TUNNEL RELEASE Left 05/08/2015   Procedure: CARPAL TUNNEL RELEASE;  Surgeon: Charlotte Crumb, MD;  Location: [facility];  Service: Orthopedics;  Laterality: Left;  . COLON SURGERY    . COLONOSCOPY    . CORONARY STENT PLACEMENT    . HERNIA REPAIR    . OPEN REDUCTION INTERNAL FIXATION (ORIF) DISTAL RADIAL FRACTURE Left 05/08/2015   Procedure: OPEN REDUCTION INTERNAL FIXATION (ORIF) DISTAL RADIAL FRACTURE, CARPAL TUNNEL RELEASE;  Surgeon: Charlotte Crumb, MD;  Location: [facility];  Service: Orthopedics;  Laterality: Left;  . REPAIR OF COMPLEX TRACTION RETINAL DETACHMENT      . Social History   Tobacco Use  . Smoking status: Former Smoker    Packs/day: 1.00    Years: 20.00    Pack  years: 20.00    Last attempt to quit: 01/08/1980    Years since quitting: 36.9  . Smokeless tobacco: Never Used  Substance Use Topics  . Alcohol use: No    Alcohol/week: 0.0 oz  . Drug use: No    ALLERGIES:  is allergic to iodine.  MEDICATIONS:  Current Outpatient Medications  Medication Sig Dispense Refill  . aspirin 81 MG tablet Take 81 mg by mouth daily.      Marland Kitchen atorvastatin (LIPITOR) 40 MG tablet Take 40 mg by mouth daily.      . clopidogrel  (PLAVIX) 75 MG tablet Take 75 mg by mouth daily.      . Coenzyme Q10 (CO Q 10 PO) Take 1 capsule by mouth daily.     . dorzolamide-timolol (COSOPT) 22.3-6.8 MG/ML ophthalmic solution Place 1 drop into both eyes 2 (two) times daily.    . fentaNYL (SUBLIMAZE) 100 MCG/2ML injection Inject 200 mcg into the vein once.    Marland Kitchen glucosamine-chondroitin 500-400 MG tablet Take 1 tablet by mouth daily.     Marland Kitchen lisinopril (PRINIVIL,ZESTRIL) 5 MG tablet Take 5 mg by mouth daily.     . Lutein 10 MG TABS Take 1 tablet by mouth daily.     Marland Kitchen LYCOPENE PO Take 1 tablet by mouth daily.     . metFORMIN (GLUCOPHAGE) 500 MG tablet Take 1,000 mg by mouth 2 (two) times daily with a meal.    . metoprolol (LOPRESSOR) 50 MG tablet Take 25 mg by mouth 2 (two) times daily.    . Omega-3 Fatty Acids (FISH OIL) 1000 MG CAPS Take 1 capsule by mouth daily.    Marland Kitchen oxyCODONE-acetaminophen (ROXICET) 5-325 MG tablet Take 1 tablet by mouth every 4 (four) hours as needed for severe pain. 30 tablet 0  . pantoprazole (PROTONIX) 40 MG tablet Take 40 mg by mouth daily.     . sitaGLIPtin (JANUVIA) 100 MG tablet Take 100 mg by mouth daily.      Marland Kitchen levofloxacin (LEVAQUIN) 500 MG tablet Take 1 tablet (500 mg total) by mouth daily. 7 tablet 0  . nitroGLYCERIN (NITROSTAT) 0.4 MG SL tablet Place 1 tablet (0.4 mg total) under the tongue every 5 (five) minutes as needed for chest pain. 25 tablet 3   No current facility-administered medications for this visit.     PHYSICAL EXAMINATION: ECOG PERFORMANCE STATUS: 2 - Symptomatic, <50% confined to bed  . Vitals:   12/02/16 1219  BP: (!) 144/57  Pulse: 83  Resp: 18  Temp: 98.3 F (36.8 C)  SpO2: 99%    Filed Weights   12/02/16 1219  Weight: 242 lb (109.8 kg)   .Body mass index is 35.74 kg/m.  GENERAL:alert, in no acute distress and comfortable SKIN: no acute rashes, no significant lesions EYES: conjunctiva are pink and non-injected, sclera anicteric OROPHARYNX: MMM, no exudates, no  oropharyngeal erythema or ulceration NECK: supple, no JVD LYMPH:  no palpable lymphadenopathy in the cervical, axillary or inguinal regions LUNGS: clear to auscultation b/l with normal respiratory effort HEART: regular rate & rhythm ABDOMEN:  normoactive bowel sounds , non tender, not distended. No hepato-splenomegaly. Extremity: no pedal edema PSYCH: alert & oriented x 3 with fluent speech NEURO: no focal motor/sensory deficits  LABORATORY DATA:   I have reviewed the data as listed  . CBC Latest Ref Rng & Units 12/02/2016 07/29/2016 12/28/2015  WBC 4.0 - 10.3 10e3/uL 7.0 6.1 8.6  Hemoglobin 13.0 - 17.1 g/dL 12.9(L) 12.3(L) 12.5(L)  Hematocrit 38.4 -  49.9 % 40.3 38.1(L) 39.5  Platelets 140 - 400 10e3/uL 206 184 221   . CBC    Component Value Date/Time   WBC 7.0 12/02/2016 1203   WBC 5.6 01/05/2015 0914   RBC 4.53 12/02/2016 1203   RBC 4.64 01/05/2015 0914   HGB 12.9 (L) 12/02/2016 1203   HCT 40.3 12/02/2016 1203   PLT 206 12/02/2016 1203   MCV 89.0 12/02/2016 1203   MCH 28.5 12/02/2016 1203   MCH 28.4 01/05/2015 0914   MCHC 32.0 12/02/2016 1203   MCHC 31.1 01/05/2015 0914   RDW 14.2 12/02/2016 1203   LYMPHSABS 1.3 12/02/2016 1203   MONOABS 0.5 12/02/2016 1203   EOSABS 0.1 12/02/2016 1203   BASOSABS 0.0 12/02/2016 1203    . CMP Latest Ref Rng & Units 12/02/2016 07/29/2016 12/28/2015  Glucose 70 - 140 mg/dl 175(H) 116 196(H)  BUN 7.0 - 26.0 mg/dL 22.0 21.0 19.9  Creatinine 0.7 - 1.3 mg/dL 1.4(H) 1.2 1.1  Sodium 136 - 145 mEq/L 140 139 139  Potassium 3.5 - 5.1 mEq/L 4.7 5.0 4.6  Chloride 101 - 111 mmol/L - - -  CO2 22 - 29 mEq/L 20(L) 25 20(L)  Calcium 8.4 - 10.4 mg/dL 9.3 9.8 9.2  Total Protein 6.4 - 8.3 g/dL 7.0 6.9 6.8  Total Bilirubin 0.20 - 1.20 mg/dL 0.41 0.44 0.30  Alkaline Phos 40 - 150 U/L 62 51 66  AST 5 - 34 U/L 18 20 16   ALT 0 - 55 U/L 26 25 18          RADIOGRAPHIC STUDIES: I have personally reviewed the radiological images as listed and  agreed with the findings in the report. Ct Abdomen Pelvis Wo Contrast  Result Date: 11/27/2016 CLINICAL DATA:  Fatigue and diarrhea. Generalized abdominal pain for 1 month. Remote history of follicular lymphoma and colon cancer. EXAM: CT CHEST, ABDOMEN AND PELVIS WITHOUT CONTRAST TECHNIQUE: Multidetector CT imaging of the chest, abdomen and pelvis was performed following the standard protocol without IV contrast. COMPARISON:  Abdomen and pelvis CT from 07/01/2013.    Marland Kitchen FINDINGS: CT CHEST FINDINGS Cardiovascular: The heart size is normal. No pericardial effusion. Coronary artery calcification is evident. Atherosclerotic calcification is noted in the wall of the thoracic aorta. Mediastinum/Nodes: No mediastinal lymphadenopathy. No evidence for gross hilar lymphadenopathy although assessment is limited by the lack of intravenous contrast on today's study. The esophagus has normal imaging features. There is no axillary lymphadenopathy. Lungs/Pleura: 5 mm right middle lobe pulmonary nodule seen image 100 series 4. 4 mm right middle lobe pulmonary nodule seen on the same image. These are both stable since prior 2015 exam. 5 mm right lower lobe pulmonary nodule seen image 119 was also present on the previous study. 5 mm right perifissural nodule seen on image 94, unchanged. 6 mm left lower lobe nodule on image 103 is stable. 3 mm left lower lobe nodule on image 80 was not included on the prior study. Musculoskeletal: Bone windows reveal no worrisome lytic or sclerotic osseous lesions. CT ABDOMEN PELVIS FINDINGS Hepatobiliary: The liver shows diffusely decreased attenuation suggesting steatosis. There is no evidence for gallstones, gallbladder wall thickening, or pericholecystic fluid. No intrahepatic or extrahepatic biliary dilation. Pancreas: No focal mass lesion. No dilatation of the main duct. No intraparenchymal cyst. No peripancreatic edema. Spleen: No splenomegaly. No focal mass lesion. Adrenals/Urinary Tract: No  adrenal nodule or mass. Kidneys unremarkable. No evidence for hydroureter. The urinary bladder appears normal for the degree of distention. Stomach/Bowel: Stomach is nondistended.  No gastric wall thickening. No evidence of outlet obstruction. Duodenum is normally positioned as is the ligament of Treitz. Proximal small bowel unremarkable. Patient is status post right hemicolectomy. There is wall thickening in the neo terminal ileum with a potential intraluminal soft tissue pedunculated polyp visible on image 109 series 2. This wall thickening extends into the ileocolic anastomosis and there is asymmetric wall thickening in the colon just distal to the anastomosis. Edema/inflammation is present in the mesentery around the anastomosis and small lymph nodes in this region that were present previously have progressed in the interval with a 15 mm lymph node seen on image 91 series 2. The pericolonic stranding extends down the sigmoid colon to the region of the rectum were there are multiple tiny perirectal nodules and potential thickening in the anterior and right rectal wall. Vascular/Lymphatic: There is abdominal aortic atherosclerosis without aneurysm. No gastrohepatic or hepatoduodenal ligament lymphadenopathy. See above for mild mesenteric Lymphadenopathy. No retroperitoneal lymphadenopathy. No pelvic sidewall lymphadenopathy. Reproductive: The prostate gland and seminal vesicles have normal imaging features. Other: No intraperitoneal free fluid. Musculoskeletal: Bone windows reveal no worrisome lytic or sclerotic osseous lesions. IMPRESSION: 1. Status post right hemicolectomy with circumferential wall thickening in the neo terminal ileum extending into the ileocolic anastomosis and having asymmetric wall thickening in the proximal post anastomotic colon. There is pericolonic edema/inflammation tracking down the remaining segment of the colon to the rectum with interval increase in size of previously seen mesenteric  lymph nodes adjacent to the anastomosis and interval development of multiple tiny perirectal nodules. These imaging features may be related to inflammatory bowel disease although infectious etiology neoplasm could have this appearance. 2. Apparent wall thickening in the rectum. 3. Potential pedunculated polyp in the neo terminal ileum. 4.  Aortic Atherosclerois (ICD10-170.0) 5. Multiple tiny pulmonary nodules in the lower lungs. Many of these were seen on the previous abdomen and pelvis CT and are stable in the interval. One particular 3 mm nodule in the left lower lobe was not included on the prior study and attention to this on follow-up imaging is recommended. Electronically Signed   By: Misty Stanley M.D.   On: 11/27/2016 15:40   Ct Chest Wo Contrast  Result Date: 11/27/2016 CLINICAL DATA:  Fatigue and diarrhea. Generalized abdominal pain for 1 month. Remote history of follicular lymphoma and colon cancer. EXAM: CT CHEST, ABDOMEN AND PELVIS WITHOUT CONTRAST TECHNIQUE: Multidetector CT imaging of the chest, abdomen and pelvis was performed following the standard protocol without IV contrast. COMPARISON:  Abdomen and pelvis CT from 07/01/2013.    Marland Kitchen FINDINGS: CT CHEST FINDINGS Cardiovascular: The heart size is normal. No pericardial effusion. Coronary artery calcification is evident. Atherosclerotic calcification is noted in the wall of the thoracic aorta. Mediastinum/Nodes: No mediastinal lymphadenopathy. No evidence for gross hilar lymphadenopathy although assessment is limited by the lack of intravenous contrast on today's study. The esophagus has normal imaging features. There is no axillary lymphadenopathy. Lungs/Pleura: 5 mm right middle lobe pulmonary nodule seen image 100 series 4. 4 mm right middle lobe pulmonary nodule seen on the same image. These are both stable since prior 2015 exam. 5 mm right lower lobe pulmonary nodule seen image 119 was also present on the previous study. 5 mm right  perifissural nodule seen on image 94, unchanged. 6 mm left lower lobe nodule on image 103 is stable. 3 mm left lower lobe nodule on image 80 was not included on the prior study. Musculoskeletal: Bone windows reveal no worrisome lytic or  sclerotic osseous lesions. CT ABDOMEN PELVIS FINDINGS Hepatobiliary: The liver shows diffusely decreased attenuation suggesting steatosis. There is no evidence for gallstones, gallbladder wall thickening, or pericholecystic fluid. No intrahepatic or extrahepatic biliary dilation. Pancreas: No focal mass lesion. No dilatation of the main duct. No intraparenchymal cyst. No peripancreatic edema. Spleen: No splenomegaly. No focal mass lesion. Adrenals/Urinary Tract: No adrenal nodule or mass. Kidneys unremarkable. No evidence for hydroureter. The urinary bladder appears normal for the degree of distention. Stomach/Bowel: Stomach is nondistended. No gastric wall thickening. No evidence of outlet obstruction. Duodenum is normally positioned as is the ligament of Treitz. Proximal small bowel unremarkable. Patient is status post right hemicolectomy. There is wall thickening in the neo terminal ileum with a potential intraluminal soft tissue pedunculated polyp visible on image 109 series 2. This wall thickening extends into the ileocolic anastomosis and there is asymmetric wall thickening in the colon just distal to the anastomosis. Edema/inflammation is present in the mesentery around the anastomosis and small lymph nodes in this region that were present previously have progressed in the interval with a 15 mm lymph node seen on image 91 series 2. The pericolonic stranding extends down the sigmoid colon to the region of the rectum were there are multiple tiny perirectal nodules and potential thickening in the anterior and right rectal wall. Vascular/Lymphatic: There is abdominal aortic atherosclerosis without aneurysm. No gastrohepatic or hepatoduodenal ligament lymphadenopathy. See above for  mild mesenteric Lymphadenopathy. No retroperitoneal lymphadenopathy. No pelvic sidewall lymphadenopathy. Reproductive: The prostate gland and seminal vesicles have normal imaging features. Other: No intraperitoneal free fluid. Musculoskeletal: Bone windows reveal no worrisome lytic or sclerotic osseous lesions. IMPRESSION: 1. Status post right hemicolectomy with circumferential wall thickening in the neo terminal ileum extending into the ileocolic anastomosis and having asymmetric wall thickening in the proximal post anastomotic colon. There is pericolonic edema/inflammation tracking down the remaining segment of the colon to the rectum with interval increase in size of previously seen mesenteric lymph nodes adjacent to the anastomosis and interval development of multiple tiny perirectal nodules. These imaging features may be related to inflammatory bowel disease although infectious etiology neoplasm could have this appearance. 2. Apparent wall thickening in the rectum. 3. Potential pedunculated polyp in the neo terminal ileum. 4.  Aortic Atherosclerois (ICD10-170.0) 5. Multiple tiny pulmonary nodules in the lower lungs. Many of these were seen on the previous abdomen and pelvis CT and are stable in the interval. One particular 3 mm nodule in the left lower lobe was not included on the prior study and attention to this on follow-up imaging is recommended. Electronically Signed   By: Misty Stanley M.D.   On: 11/27/2016 15:40    ASSESSMENT & PLAN:   1.Follicular NHL Patient has never required treatment for this.  no obvious progression by CT [facility] 11-2015 -CT findings were discussed in great detail with the pt- could potentially be inflammation, infection or other . Lab Results  Component Value Date   LDH 124 (L) 12/02/2016   Plan -Patient has no clinical or lab or overt radiographic evidence of lymphoma progression at this time. -No overtly palpable significantly enlarged peripheral  lymphadenopathy. LDH level is within normal limits. No reported constitutional symptoms. No other bothersome focal symptoms are related to his for recurrent non-Hodgkin's lymphoma.   2. Iron deficiency anemia previously related to GI bleeding from nonmalignant inflammatory anastomotic colon polyp.  Iron deficiency persistent. No significant anemia at this time.  3.remote history of synchronous colon cancers. Colonoscopy planned  12/08-2016 by Dr Candis Musa not been done.  CT abd -- showed  PLAN -patient will need f/u with Dr Amedeo Plenty for his scheduled colonoscopy that was planned in 12/14/2016 - patient was recommended to f/u with PCP and Dr Amedeo Plenty regarding this. -he is still on dual antiplatelet therapy which increase his risk of chronic GI losses. -reasonable to consider PO Iron replacement with Iron polysaccharide 150mg  daily with PCP once colonoscopy/GI workup completed. -Plan to obtain stool studies to discover reason for loose stools/diarrhea - done and showed enteropathogenic E.Coli -- given prescription for levofloxacin    To lab again today for stool studies RTC with labs in 4 weeks with Dr Irene Limbo and after colonoscopy   I spent 20 minutes counseling the patient face to face. The total time spent in the appointment was 25 minutes and more than 50% was on counseling and direct patient cares.    Sullivan Lone MD [place] AAHIVMS [facility] Hematology/Oncology Physician [facility]  (Office):       [phone] (Work cell):  [phone] (Fax):           [phone]  This document serves as a record of services personally performed by Sullivan Lone, MD. It was created on his behalf by Alean Rinne, a trained medical scribe. The creation of this record is based on the scribe's personal observations and the provider's statements to them.   .I have reviewed the above documentation for accuracy and completeness, and I agree with the above. Brunetta Genera MD MS

## 2016-12-02 NOTE — Telephone Encounter (Signed)
Gave avs and calendar for December  °

## 2016-12-03 ENCOUNTER — Other Ambulatory Visit: Payer: Self-pay | Admitting: *Deleted

## 2016-12-03 ENCOUNTER — Other Ambulatory Visit (HOSPITAL_COMMUNITY)
Admission: AD | Admit: 2016-12-03 | Discharge: 2016-12-03 | Disposition: A | Discharge status: Home / Self Care | Payer: PPO | Source: Ambulatory Visit | Attending: Hematology | Admitting: Hematology

## 2016-12-03 DIAGNOSIS — R197 Diarrhea, unspecified: Secondary | ICD-10-CM | POA: Insufficient documentation

## 2016-12-03 LAB — C DIFFICILE QUICK SCREEN W PCR REFLEX
C DIFFICILE (CDIFF) INTERP: NOT DETECTED
C DIFFICILE (CDIFF) TOXIN: NEGATIVE
C Diff antigen: NEGATIVE

## 2016-12-05 ENCOUNTER — Encounter: Payer: Self-pay | Admitting: *Deleted

## 2016-12-05 ENCOUNTER — Telehealth: Payer: Self-pay | Admitting: Interventional Cardiology

## 2016-12-05 LAB — GASTROINTESTINAL PANEL BY PCR, STOOL (REPLACES STOOL CULTURE)
ADENOVIRUS F40/41: NOT DETECTED
ASTROVIRUS: NOT DETECTED
CRYPTOSPORIDIUM: NOT DETECTED
Campylobacter species: NOT DETECTED
Cyclospora cayetanensis: NOT DETECTED
ENTEROAGGREGATIVE E COLI (EAEC): NOT DETECTED
ENTEROPATHOGENIC E COLI (EPEC): DETECTED — AB
Entamoeba histolytica: NOT DETECTED
Enterotoxigenic E coli (ETEC): NOT DETECTED
GIARDIA LAMBLIA: NOT DETECTED
NOROVIRUS GI/GII: NOT DETECTED
Plesimonas shigelloides: NOT DETECTED
ROTAVIRUS A: NOT DETECTED
Salmonella species: NOT DETECTED
Sapovirus (I, II, IV, and V): NOT DETECTED
Shiga like toxin producing E coli (STEC): NOT DETECTED
Shigella/Enteroinvasive E coli (EIEC): NOT DETECTED
Vibrio cholerae: NOT DETECTED
Vibrio species: NOT DETECTED
YERSINIA ENTEROCOLITICA: NOT DETECTED

## 2016-12-05 NOTE — Telephone Encounter (Signed)
New message     Call from [facility] Pre-operative Risk Assessment    Request for surgical clearance:  1. What type of surgery is being performed? colonoscopy  2. When is this surgery scheduled? 12/13/2016  3. Are there any medications that need to be held prior to surgery and how long? Plavix  4. Practice name and name of physician performing surgery? Dr Watt Climes  5. What is your office phone and fax number? Janett Billow [phone] and fax [phone]   6. Anesthesia type (None, local, MAC, general) ? general   Laurier Nancy 12/05/2016, 4:09 PM  _________________________________________________________________   (provider comments below)

## 2016-12-05 NOTE — Progress Notes (Unsigned)
Results of GI Panel given to [facility]

## 2016-12-06 ENCOUNTER — Other Ambulatory Visit: Payer: Self-pay | Admitting: *Deleted

## 2016-12-06 ENCOUNTER — Telehealth: Payer: Self-pay | Admitting: *Deleted

## 2016-12-06 MED ORDER — LEVOFLOXACIN 500 MG PO TABS: 500.0000 mg | ORAL_TABLET | Freq: Every day | ORAL | 0 refills | Status: DC

## 2016-12-06 NOTE — Telephone Encounter (Signed)
Follow up    [facility] calling  for status on clearance. Requesting response by 4pm today.

## 2016-12-06 NOTE — Telephone Encounter (Signed)
Per Dr. Irene Limbo, recent stool sample positive for E.coli.  Rx for levofloxacin 500mg  po daily x 7 days sent to pt's pharmacy.  Called pt to inform him and verify correct pharmacy. Pt verbalized understanding.  Pt stated he has upcoming colonoscopy scheduled.  Per pt request.  Lab results for stool sample faxed to Dr. Perley Jain office at [phone] (fax transmission verified).

## 2016-12-06 NOTE — Telephone Encounter (Signed)
F/u Call:  Patient calling for update on clearance. Requesting a call to [facility] at [place]

## 2016-12-09 NOTE — Telephone Encounter (Signed)
   Primary Cardiologist: Dr. Tamala Julian  Chart reviewed as part of pre-operative protocol coverage. Because of Mark SCHNECK Jr.'s past medical history and time since last visit, he/she will require a follow-up visit in order to better assess preoperative cardiovascular risk.  Last seen by Dr. Tamala Julian 09/2014 Last seen by APP: 10/2015  Reviewed with Dr. Tamala Julian. Patient needs office visit per protocol. Dr. Tamala Julian is unsure why he is still on Plavix. Seems last PCI on 2005.  Pre-op covering staff: - Please schedule appointment and call patient to inform them. - Please contact requesting surgeon's office via preferred method (i.e, phone, fax) to inform them of need for appointment prior to surgery.  [place], [place]  12/09/2016, 2:36 PM

## 2016-12-09 NOTE — Telephone Encounter (Signed)
Left message for Janett Billow [phone], detailed, advising that patient has OV 12/12/16 and clearance will be determined at that time.

## 2016-12-09 NOTE — Telephone Encounter (Signed)
Spoke with patient and explained that per pre-operative protocol, since patient was last seen by Dr. Tamala Julian in 09/2014, a follow up visit is required prior to clearing him for colonoscopy. He reports that he was diagnosed with E-coli and so his procedure has been postponed until 12/20/16.   He has been scheduled with Cecilie Kicks, APP on 12/12/16 at 8:30 am.  Pt aware of location of office as well as appt date and time.

## 2016-12-10 NOTE — Progress Notes (Signed)
Cardiology Office Note   Date:  12/12/2016   ID:  Mark Nickel., DOB 07-13-1940, MRN [id number]  PCP:  Mark Low, MD  Cardiologist:  Dr. Tamala Julian    Chief Complaint  Patient presents with  . Medical Clearance      History of Present Illness: Mark West. is a 76 y.o. male who presents for pre-op exam.  Last seen in office 10/2015  This is for colonoscopy need to hold plavix.   He has known CAD with stent 2005 (location unknown and no records available), hypertension, hyperlipidemia, right carotid occlusion, diastolic heart failure, and diabetes mellitus.  Patient is sedentary. He has left knee trouble that prevents exercise.  He had a West risk myocardial perfusion study in July 2015 and a carotid Doppler study done in December 2015.  There is total occlusion of the right internal carotid artery. Left internal carotid is less than 40% obstructed. He is followed by Dr. Marko West and now Dr. Irene West with her retirement for iron deficiency anemia and follicular lymphoma and hx colo cancer.   Today he has no chest pain and no SOB with singing but at times with walking.  No lightheadedness or dizziness.  Recently dx with E. Coli enteropathogenic.  He has been treated and needs colonoscopy.  It has been over a year since he has been seen so here today for eval.  He is to have carotid dopplers soon as well.  He continues to work and travels by car for his job.  He occ has lower ext edema.  He has stopped eating salt.   He has leg cramps at times but this is not associated with activity.     Past Medical History:  Diagnosis Date  . Anemia, iron deficiency 01/26/2011  . Arthritis   . Asthma   . Cancer (HCC)    nhl, colon ca  . Carotid artery occlusion    right occlusion of carotid with moderate left plaque  . Coronary artery disease August 2005   MI with stent  . Diabetes mellitus   . Hyperlipidemia   . Hypertension   . Kidney stones 05/2010   lithotripsy  . Myocardial  infarction [facility])     Past Surgical History:  Procedure Laterality Date  . CARPAL TUNNEL RELEASE Left 05/08/2015   Procedure: CARPAL TUNNEL RELEASE;  Surgeon: Charlotte Crumb, MD;  Location: [facility];  Service: Orthopedics;  Laterality: Left;  . COLON SURGERY    . COLONOSCOPY    . CORONARY STENT PLACEMENT    . HERNIA REPAIR    . OPEN REDUCTION INTERNAL FIXATION (ORIF) DISTAL RADIAL FRACTURE Left 05/08/2015   Procedure: OPEN REDUCTION INTERNAL FIXATION (ORIF) DISTAL RADIAL FRACTURE, CARPAL TUNNEL RELEASE;  Surgeon: Charlotte Crumb, MD;  Location: [facility];  Service: Orthopedics;  Laterality: Left;  . REPAIR OF COMPLEX TRACTION RETINAL DETACHMENT       Current Outpatient Medications  Medication Sig Dispense Refill  . aspirin 81 MG tablet Take 81 mg by mouth daily.      Marland Kitchen atorvastatin (LIPITOR) 40 MG tablet Take 40 mg by mouth daily.      . Coenzyme Q10 (CO Q 10 PO) Take 1 capsule by mouth daily.     . dorzolamide-timolol (COSOPT) 22.3-6.8 MG/ML ophthalmic solution Place 1 drop into both eyes 2 (two) times daily.    . fentaNYL (SUBLIMAZE) 100 MCG/2ML injection Inject 200 mcg into the vein once.    Marland Kitchen glucosamine-chondroitin 500-400 MG tablet Take 1 tablet  by mouth daily.     Marland Kitchen levofloxacin (LEVAQUIN) 500 MG tablet Take 1 tablet (500 mg total) by mouth daily. 7 tablet 0  . lisinopril (PRINIVIL,ZESTRIL) 5 MG tablet Take 5 mg by mouth daily.     . Lutein 10 MG TABS Take 1 tablet by mouth daily.     Marland Kitchen LYCOPENE PO Take 1 tablet by mouth daily.     . metFORMIN (GLUCOPHAGE) 500 MG tablet Take 1,000 mg by mouth 2 (two) times daily with a meal.    . metoprolol (LOPRESSOR) 50 MG tablet Take 25 mg by mouth 2 (two) times daily.    . Omega-3 Fatty Acids (FISH OIL) 1000 MG CAPS Take 1 capsule by mouth daily.    Marland Kitchen oxyCODONE-acetaminophen (ROXICET) 5-325 MG tablet Take 1 tablet by mouth every 4 (four) hours as needed for severe pain. 30 tablet 0  . pantoprazole (PROTONIX) 40 MG tablet Take 40 mg by mouth daily.      . sitaGLIPtin (JANUVIA) 100 MG tablet Take 100 mg by mouth daily.      . clopidogrel (PLAVIX) 75 MG tablet Take 75 mg by mouth daily.      . nitroGLYCERIN (NITROSTAT) 0.4 MG SL tablet Place 1 tablet (0.4 mg total) under the tongue every 5 (five) minutes as needed for chest pain. 25 tablet 3   No current facility-administered medications for this visit.     Allergies:   Iodine    Social History:  The patient  reports that he quit smoking about 36 years ago. He has a 20.00 pack-year smoking history. he has never used smokeless tobacco. He reports that he does not drink alcohol or use drugs.   Family History:  The patient's family history includes Cancer in his brother, father, maternal aunt, and mother; Healthy in his sister.    ROS:  General:no colds or fevers, no weight changes Skin:no rashes or ulcers HEENT:no blurred vision, no congestion CV:see HPI PUL:see HPI GI:++diarrhea treated, no constipation or melena, no indigestion GU:no hematuria, no dysuria MS:no joint pain, no claudication Neuro:no syncope, no lightheadedness Endo:no diabetes, no thyroid disease  Wt Readings from Last 3 Encounters:  12/12/16 245 lb 12.8 oz (111.5 kg)  12/02/16 242 lb (109.8 kg)  07/29/16 250 lb 9.6 oz (113.7 kg)     PHYSICAL EXAM: VS:  BP 132/72   Pulse 89   Resp 16   Ht 5\' 9"  (1.753 m)   Wt 245 lb 12.8 oz (111.5 kg)   SpO2 98%   BMI 36.30 kg/m  , BMI Body mass index is 36.3 kg/m. General:Pleasant affect, NAD Skin:Warm and dry, brisk capillary refill HEENT:normocephalic, sclera clear, mucus membranes moist Neck:supple, no JVD, no bruits  Heart:S1S2 RRR without murmur, gallup, rub or click Lungs:clear without rales, rhonchi, or wheezes UJW:JXBJY, soft, non tender, + BS, do not palpate liver spleen or masses Ext:no to tr lower ext edema, 2+ pedal pulses, 2+ radial pulses Neuro:alert and oriented X 3, MAE, follows commands, + facial symmetry    EKG:  EKG is ordered today. The ekg  ordered today demonstrates SR normal EKG   Recent Labs: 12/02/2016: ALT 26; BUN 22.0; Creatinine 1.4; HGB 12.9; Platelets 206; Potassium 4.7; Sodium 140    Lipid Panel    Component Value Date/Time   CHOL 139 05/08/2005 1216   TRIG 97 05/08/2005 1216   HDL 31 (L) 05/08/2005 1216   CHOLHDL 4.5 05/08/2005 1216   VLDL 19 05/08/2005 1216   LDLCALC 89 05/08/2005 1216  Other studies Reviewed: Additional studies/ records that were reviewed today include: last carotids with occluded R ICA and 40 % [place].  . Myoview Impression 07/2013 Exercise Capacity: Lexiscan with no exercise. BP Response: Normal blood pressure response. Clinical Symptoms: No significant symptoms noted. ECG Impression: No significant ST segment change suggestive of ischemia. Comparison with Prior Nuclear Study: No images to compare  Overall Impression: Normal stress nuclear study.  LV Ejection Fraction: 58%. LV Wall Motion: NL LV Function; NL Wall Motion.  Echo Study Conclusions from 09/2014  - Left ventricle: The cavity size was normal. Wall thickness was normal. Systolic function was normal. The estimated ejection fraction was in the range of 55% to 60%. Wall motion was normal; there were no regional wall motion abnormalities. Doppler parameters are consistent with abnormal left ventricular relaxation (grade 1 diastolic dysfunction).  ASSESSMENT AND PLAN:  1.  Pre-op exam.  West risk with West risk procedure, he will hold plavix for 5-7 days for procedure.  Discussed with Dr. Tamala Julian.  No chest pain and no SOB.  He will follow up with Dr. Tamala Julian in 3-4 months.  Chart reviewed as part of pre-operative protocol coverage. Given past medical history and time since last visit, based on ACC/AHA guidelines, Srihan Brutus. would be at acceptable risk for the planned procedure without further cardiovascular testing.   I will route this recommendation to the requesting party via Epic fax  function and remove from pre-op pool.  Please call with questions.  2.  CAD stable with hx stent no chest pain and meets 4 METs without any symptoms  3.  Carotid disease stable. No dizziness. For carotid dopplers today.  4.  HTN controlled   Current medicines are reviewed with the patient today.  The patient Has no concerns regarding medicines.  The following changes have been made:  See above Labs/ tests ordered today include:see above  Disposition:   FU:  see above  Signed, Cecilie Kicks, NP  12/12/2016 9:31 AM    [facility] [address], [place], [place], [place] Phone: [phone]; Fax: [phone]

## 2016-12-12 ENCOUNTER — Ambulatory Visit (HOSPITAL_COMMUNITY)
Admission: RE | Admit: 2016-12-12 | Discharge: 2016-12-12 | Disposition: A | Discharge status: Home / Self Care | Payer: PPO | Source: Ambulatory Visit | Attending: Cardiology | Admitting: Cardiology

## 2016-12-12 ENCOUNTER — Ambulatory Visit: Payer: PPO | Admitting: Cardiology

## 2016-12-12 ENCOUNTER — Encounter: Payer: Self-pay | Admitting: Cardiology

## 2016-12-12 VITALS — BP 132/72 | HR 89 | Resp 16 | Ht 69.0 in | Wt 245.8 lb

## 2016-12-12 DIAGNOSIS — I708 Atherosclerosis of other arteries: Secondary | ICD-10-CM | POA: Diagnosis not present

## 2016-12-12 DIAGNOSIS — Z01810 Encounter for preprocedural cardiovascular examination: Secondary | ICD-10-CM | POA: Diagnosis not present

## 2016-12-12 DIAGNOSIS — I6523 Occlusion and stenosis of bilateral carotid arteries: Secondary | ICD-10-CM | POA: Diagnosis not present

## 2016-12-12 DIAGNOSIS — I252 Old myocardial infarction: Secondary | ICD-10-CM

## 2016-12-12 DIAGNOSIS — I2510 Atherosclerotic heart disease of native coronary artery without angina pectoris: Secondary | ICD-10-CM

## 2016-12-12 DIAGNOSIS — I10 Essential (primary) hypertension: Secondary | ICD-10-CM | POA: Diagnosis not present

## 2016-12-12 NOTE — Patient Instructions (Signed)
Medication Instructions:  1. HOLD PLAVIX 5 DAYS PRIOR TO COLONOSCOPY  2. Your physician recommends that you continue on your current medications as directed. Please refer to the Current Medication list given to you today.    Labwork: NONE ORDERED   Testing/Procedures: YOU WILL HAVE YOUR CAROTID ULTRASOUND TODAY AT THE [place] AT [facility] ON THE 2ND FLOOR. I HAVE CANCELLED CAROTID APPT SCHEDULED FOR 12/20/16.   Follow-Up: Your physician wants you to follow-up in: 6-8 MONTHS WITH DR. Gaspar Bidding will receive a reminder letter in the mail two months in advance. If you don't receive a letter, please call our office to schedule the follow-up appointment.   Any Other Special Instructions Will Be Listed Below (If Applicable).     If you need a refill on your cardiac medications before your next appointment, please call your pharmacy.

## 2016-12-20 ENCOUNTER — Inpatient Hospital Stay (HOSPITAL_COMMUNITY): Admission: RE | Admit: 2016-12-20 | Payer: PPO | Source: Ambulatory Visit

## 2016-12-20 DIAGNOSIS — Z980 Intestinal bypass and anastomosis status: Secondary | ICD-10-CM | POA: Diagnosis not present

## 2016-12-20 DIAGNOSIS — K5140 Inflammatory polyps of colon without complications: Secondary | ICD-10-CM | POA: Diagnosis not present

## 2016-12-20 DIAGNOSIS — Z85038 Personal history of other malignant neoplasm of large intestine: Secondary | ICD-10-CM | POA: Diagnosis not present

## 2016-12-21 ENCOUNTER — Encounter: Payer: Self-pay | Admitting: Interventional Cardiology

## 2016-12-21 DIAGNOSIS — I771 Stricture of artery: Secondary | ICD-10-CM | POA: Insufficient documentation

## 2016-12-24 ENCOUNTER — Telehealth: Payer: Self-pay | Admitting: Interventional Cardiology

## 2016-12-24 NOTE — Telephone Encounter (Signed)
Spoke with pt and went over carotid results.  Pt verbalized understanding.

## 2016-12-24 NOTE — Telephone Encounter (Signed)
Follow up     Patient returning call for carotid results

## 2016-12-25 DIAGNOSIS — K5140 Inflammatory polyps of colon without complications: Secondary | ICD-10-CM | POA: Diagnosis not present

## 2016-12-26 ENCOUNTER — Telehealth: Payer: Self-pay

## 2016-12-26 ENCOUNTER — Telehealth: Payer: Self-pay | Admitting: Interventional Cardiology

## 2016-12-26 ENCOUNTER — Ambulatory Visit (HOSPITAL_BASED_OUTPATIENT_CLINIC_OR_DEPARTMENT_OTHER): Payer: PPO | Admitting: Hematology

## 2016-12-26 ENCOUNTER — Telehealth: Payer: Self-pay | Admitting: Hematology

## 2016-12-26 ENCOUNTER — Encounter: Payer: Self-pay | Admitting: Hematology

## 2016-12-26 ENCOUNTER — Other Ambulatory Visit (HOSPITAL_BASED_OUTPATIENT_CLINIC_OR_DEPARTMENT_OTHER): Payer: PPO

## 2016-12-26 VITALS — BP 159/61 | HR 66 | Temp 98.3°F | Resp 17 | Ht 66.0 in | Wt 249.5 lb

## 2016-12-26 DIAGNOSIS — M25561 Pain in right knee: Secondary | ICD-10-CM

## 2016-12-26 DIAGNOSIS — C8203 Follicular lymphoma grade I, intra-abdominal lymph nodes: Secondary | ICD-10-CM

## 2016-12-26 DIAGNOSIS — D509 Iron deficiency anemia, unspecified: Secondary | ICD-10-CM | POA: Diagnosis not present

## 2016-12-26 DIAGNOSIS — C8290 Follicular lymphoma, unspecified, unspecified site: Secondary | ICD-10-CM

## 2016-12-26 DIAGNOSIS — A044 Other intestinal Escherichia coli infections: Secondary | ICD-10-CM

## 2016-12-26 DIAGNOSIS — D500 Iron deficiency anemia secondary to blood loss (chronic): Secondary | ICD-10-CM

## 2016-12-26 DIAGNOSIS — Z85038 Personal history of other malignant neoplasm of large intestine: Secondary | ICD-10-CM

## 2016-12-26 LAB — CBC & DIFF AND RETIC
BASO%: 0.5 % (ref 0.0–2.0)
BASOS ABS: 0 10*3/uL (ref 0.0–0.1)
EOS ABS: 0.1 10*3/uL (ref 0.0–0.5)
EOS%: 1.3 % (ref 0.0–7.0)
HCT: 35 % — ABNORMAL LOW (ref 38.4–49.9)
HGB: 10.9 g/dL — ABNORMAL LOW (ref 13.0–17.1)
Immature Retic Fract: 10.8 % — ABNORMAL HIGH (ref 3.00–10.60)
LYMPH%: 26.4 % (ref 14.0–49.0)
MCH: 28.4 pg (ref 27.2–33.4)
MCHC: 31.1 g/dL — AB (ref 32.0–36.0)
MCV: 91.1 fL (ref 79.3–98.0)
MONO#: 0.4 10*3/uL (ref 0.1–0.9)
MONO%: 8 % (ref 0.0–14.0)
NEUT#: 3.5 10*3/uL (ref 1.5–6.5)
NEUT%: 63.8 % (ref 39.0–75.0)
Platelets: 196 10*3/uL (ref 140–400)
RBC: 3.84 10*6/uL — AB (ref 4.20–5.82)
RDW: 14.6 % (ref 11.0–14.6)
RETIC %: 1.35 % (ref 0.80–1.80)
RETIC CT ABS: 51.84 10*3/uL (ref 34.80–93.90)
WBC: 5.5 10*3/uL (ref 4.0–10.3)
lymph#: 1.5 10*3/uL (ref 0.9–3.3)

## 2016-12-26 LAB — COMPREHENSIVE METABOLIC PANEL
ALT: 25 U/L (ref 0–55)
AST: 22 U/L (ref 5–34)
Albumin: 3.4 g/dL — ABNORMAL LOW (ref 3.5–5.0)
Alkaline Phosphatase: 56 U/L (ref 40–150)
Anion Gap: 8 mEq/L (ref 3–11)
BUN: 16.4 mg/dL (ref 7.0–26.0)
CHLORIDE: 112 meq/L — AB (ref 98–109)
CO2: 20 mEq/L — ABNORMAL LOW (ref 22–29)
CREATININE: 1.1 mg/dL (ref 0.7–1.3)
Calcium: 8.6 mg/dL (ref 8.4–10.4)
EGFR: >60 mL/min/{1.73_m2} (ref >60)
GLUCOSE: 151 mg/dL — AB (ref 70–140)
POTASSIUM: 4.4 meq/L (ref 3.5–5.1)
SODIUM: 139 meq/L (ref 136–145)
Total Bilirubin: 0.32 mg/dL (ref 0.20–1.20)
Total Protein: 6.2 g/dL — ABNORMAL LOW (ref 6.4–8.3)

## 2016-12-26 MED ORDER — POLYSACCHARIDE IRON COMPLEX 150 MG PO CAPS: 150.0000 mg | ORAL_CAPSULE | Freq: Every day | ORAL | 1 refills | Status: DC

## 2016-12-26 NOTE — Telephone Encounter (Signed)
Spoke with Ebony Hail, [facility] at [facility] on [address] in [place], [place]. Phone order placed for iron polysaccharides (Niferex) 150mg  daily PO. Ebony Hail, [facility] verbalized order received and would begin processing.

## 2016-12-26 NOTE — Telephone Encounter (Signed)
Gave avs and calendar for January- april °

## 2016-12-26 NOTE — Telephone Encounter (Signed)
New message ° °Pt verbalized that he is returning call for the rn  °

## 2016-12-26 NOTE — Progress Notes (Signed)
Marland Kitchen  HEMATOLOGY ONCOLOGY PROGRESS NOTE  Date of service: 12/26/16  Patient Care Team: Wenda Low, MD as PCP - General (Internal Medicine)  CC: f/u for  history of colon cancer  low grade follicular NHL   INTERVAL HISTORY:  Patient is here for follow-up for his history of colon cancer and low-grade follicular non-Hodgkin's lymphoma which has not required treatment. He has also had history of previous iron deficiency anemia thought to be from colonic polyps. Since we last saw him he reports that he is doing well. He was experiencing a bout of diarrhea which tested positive for E-coli. He has completed abx treatment with levofloxacin and his diarrhea and abdominal discomfort has resolved. He also had a colonoscopy performed as well which states his GI feels comfortable with (report has not been sent over yet). He also endorses some pain within his right knee recently and he has been using a cane to ambulate.   REVIEW OF SYSTEMS:   A 10+ POINT REVIEW OF SYSTEMS WAS OBTAINED including neurology, dermatology, psychiatry, cardiac, respiratory, lymph, extremities, GI, GU, Musculoskeletal, constitutional, breasts, reproductive, HEENT.  All pertinent positives are noted in the HPI.  All others are negative. . Past Medical History:  Diagnosis Date  . Anemia, iron deficiency 01/26/2011  . Arthritis   . Asthma   . Cancer (HCC)    nhl, colon ca  . Carotid artery occlusion    right occlusion of carotid with moderate left plaque  . Coronary artery disease August 2005   MI with stent  . Diabetes mellitus   . Hyperlipidemia   . Hypertension   . Kidney stones 05/2010   lithotripsy  . Myocardial infarction ([place])     . Past Surgical History:  Procedure Laterality Date  . CARPAL TUNNEL RELEASE Left 05/08/2015   Procedure: CARPAL TUNNEL RELEASE;  Surgeon: Charlotte Crumb, MD;  Location: [facility];  Service: Orthopedics;  Laterality: Left;  . COLON SURGERY    . COLONOSCOPY    . CORONARY STENT  PLACEMENT    . HERNIA REPAIR    . OPEN REDUCTION INTERNAL FIXATION (ORIF) DISTAL RADIAL FRACTURE Left 05/08/2015   Procedure: OPEN REDUCTION INTERNAL FIXATION (ORIF) DISTAL RADIAL FRACTURE, CARPAL TUNNEL RELEASE;  Surgeon: Charlotte Crumb, MD;  Location: [facility];  Service: Orthopedics;  Laterality: Left;  . REPAIR OF COMPLEX TRACTION RETINAL DETACHMENT      . Social History   Tobacco Use  . Smoking status: Former Smoker    Packs/day: 1.00    Years: 20.00    Pack years: 20.00    Last attempt to quit: 01/08/1980    Years since quitting: 36.9  . Smokeless tobacco: Never Used  Substance Use Topics  . Alcohol use: No    Alcohol/week: 0.0 oz  . Drug use: No    ALLERGIES:  is allergic to iodine.  MEDICATIONS:  Current Outpatient Medications  Medication Sig Dispense Refill  . aspirin 81 MG tablet Take 81 mg by mouth daily.      Marland Kitchen atorvastatin (LIPITOR) 40 MG tablet Take 40 mg by mouth daily.      . Coenzyme Q10 (CO Q 10 PO) Take 1 capsule by mouth daily.     . dorzolamide-timolol (COSOPT) 22.3-6.8 MG/ML ophthalmic solution Place 1 drop into both eyes 2 (two) times daily.    . fentaNYL (SUBLIMAZE) 100 MCG/2ML injection Inject 200 mcg into the vein once.    Marland Kitchen glucosamine-chondroitin 500-400 MG tablet Take 1 tablet by mouth daily.     Marland Kitchen  lisinopril (PRINIVIL,ZESTRIL) 5 MG tablet Take 5 mg by mouth daily.     . Lutein 10 MG TABS Take 1 tablet by mouth daily.     Marland Kitchen LYCOPENE PO Take 1 tablet by mouth daily.     . metFORMIN (GLUCOPHAGE) 500 MG tablet Take 1,000 mg by mouth 2 (two) times daily with a meal.    . metoprolol (LOPRESSOR) 50 MG tablet Take 25 mg by mouth 2 (two) times daily.    . Omega-3 Fatty Acids (FISH OIL) 1000 MG CAPS Take 1 capsule by mouth daily.    Marland Kitchen oxyCODONE-acetaminophen (ROXICET) 5-325 MG tablet Take 1 tablet by mouth every 4 (four) hours as needed for severe pain. 30 tablet 0  . pantoprazole (PROTONIX) 40 MG tablet Take 40 mg by mouth daily.     . sitaGLIPtin (JANUVIA)  100 MG tablet Take 100 mg by mouth daily.      . clopidogrel (PLAVIX) 75 MG tablet Take 75 mg by mouth daily.      Marland Kitchen levofloxacin (LEVAQUIN) 500 MG tablet Take 1 tablet (500 mg total) by mouth daily. (Patient not taking: Reported on 12/26/2016) 7 tablet 0  . nitroGLYCERIN (NITROSTAT) 0.4 MG SL tablet Place 1 tablet (0.4 mg total) under the tongue every 5 (five) minutes as needed for chest pain. 25 tablet 3   No current facility-administered medications for this visit.     PHYSICAL EXAMINATION: ECOG PERFORMANCE STATUS: 2 - Symptomatic, <50% confined to bed  . Vitals:   12/26/16 1417  BP: (!) 159/61  Pulse: 66  Resp: 17  Temp: 98.3 F (36.8 C)  SpO2: 99%    Filed Weights   12/26/16 1417  Weight: 249 lb 8 oz (113.2 kg)   .Body mass index is 40.27 kg/m.  GENERAL:alert, in no acute distress and comfortable SKIN: no acute rashes, no significant lesions EYES: conjunctiva are pink and non-injected, sclera anicteric OROPHARYNX: MMM, no exudates, no oropharyngeal erythema or ulceration NECK: supple, no JVD LYMPH:  no palpable lymphadenopathy in the cervical, axillary or inguinal regions LUNGS: clear to auscultation b/l with normal respiratory effort HEART: regular rate & rhythm ABDOMEN:  normoactive bowel sounds , non tender, not distended. No hepato-splenomegaly. Extremity: no pedal edema PSYCH: alert & oriented x 3 with fluent speech NEURO: no focal motor/sensory deficits  LABORATORY DATA:   I have reviewed the data as listed  . CBC Latest Ref Rng & Units 12/26/2016 12/02/2016 07/29/2016  WBC 4.0 - 10.3 10e3/uL 5.5 7.0 6.1  Hemoglobin 13.0 - 17.1 g/dL 10.9(L) 12.9(L) 12.3(L)  Hematocrit 38.4 - 49.9 % 35.0(L) 40.3 38.1(L)  Platelets 140 - 400 10e3/uL 196 206 184   . CBC    Component Value Date/Time   WBC 5.5 12/26/2016 1400   WBC 5.6 01/05/2015 0914   RBC 3.84 (L) 12/26/2016 1400   RBC 4.64 01/05/2015 0914   HGB 10.9 (L) 12/26/2016 1400   HCT 35.0 (L) 12/26/2016  1400   PLT 196 12/26/2016 1400   MCV 91.1 12/26/2016 1400   MCH 28.4 12/26/2016 1400   MCH 28.4 01/05/2015 0914   MCHC 31.1 (L) 12/26/2016 1400   MCHC 31.1 01/05/2015 0914   RDW 14.6 12/26/2016 1400   LYMPHSABS 1.5 12/26/2016 1400   MONOABS 0.4 12/26/2016 1400   EOSABS 0.1 12/26/2016 1400   BASOSABS 0.0 12/26/2016 1400    . CMP Latest Ref Rng & Units 12/26/2016 12/02/2016 07/29/2016  Glucose 70 - 140 mg/dl 151(H) 175(H) 116  BUN 7.0 - 26.0 mg/dL  16.4 22.0 21.0  Creatinine 0.7 - 1.3 mg/dL 1.1 1.4(H) 1.2  Sodium 136 - 145 mEq/L 139 140 139  Potassium 3.5 - 5.1 mEq/L 4.4 4.7 5.0  Chloride 101 - 111 mmol/L - - -  CO2 22 - 29 mEq/L 20(L) 20(L) 25  Calcium 8.4 - 10.4 mg/dL 8.6 9.3 9.8  Total Protein 6.4 - 8.3 g/dL 6.2(L) 7.0 6.9  Total Bilirubin 0.20 - 1.20 mg/dL 0.32 0.41 0.44  Alkaline Phos 40 - 150 U/L 56 62 51  AST 5 - 34 U/L 22 18 20   ALT 0 - 55 U/L 25 26 25          RADIOGRAPHIC STUDIES: I have personally reviewed the radiological images as listed and agreed with the findings in the report. Ct Abdomen Pelvis Wo Contrast  Result Date: 11/27/2016 CLINICAL DATA:  Fatigue and diarrhea. Generalized abdominal pain for 1 month. Remote history of follicular lymphoma and colon cancer. EXAM: CT CHEST, ABDOMEN AND PELVIS WITHOUT CONTRAST TECHNIQUE: Multidetector CT imaging of the chest, abdomen and pelvis was performed following the standard protocol without IV contrast. COMPARISON:  Abdomen and pelvis CT from 07/01/2013.    Marland Kitchen FINDINGS: CT CHEST FINDINGS Cardiovascular: The heart size is normal. No pericardial effusion. Coronary artery calcification is evident. Atherosclerotic calcification is noted in the wall of the thoracic aorta. Mediastinum/Nodes: No mediastinal lymphadenopathy. No evidence for gross hilar lymphadenopathy although assessment is limited by the lack of intravenous contrast on today's study. The esophagus has normal imaging features. There is no axillary  lymphadenopathy. Lungs/Pleura: 5 mm right middle lobe pulmonary nodule seen image 100 series 4. 4 mm right middle lobe pulmonary nodule seen on the same image. These are both stable since prior 2015 exam. 5 mm right lower lobe pulmonary nodule seen image 119 was also present on the previous study. 5 mm right perifissural nodule seen on image 94, unchanged. 6 mm left lower lobe nodule on image 103 is stable. 3 mm left lower lobe nodule on image 80 was not included on the prior study. Musculoskeletal: Bone windows reveal no worrisome lytic or sclerotic osseous lesions. CT ABDOMEN PELVIS FINDINGS Hepatobiliary: The liver shows diffusely decreased attenuation suggesting steatosis. There is no evidence for gallstones, gallbladder wall thickening, or pericholecystic fluid. No intrahepatic or extrahepatic biliary dilation. Pancreas: No focal mass lesion. No dilatation of the main duct. No intraparenchymal cyst. No peripancreatic edema. Spleen: No splenomegaly. No focal mass lesion. Adrenals/Urinary Tract: No adrenal nodule or mass. Kidneys unremarkable. No evidence for hydroureter. The urinary bladder appears normal for the degree of distention. Stomach/Bowel: Stomach is nondistended. No gastric wall thickening. No evidence of outlet obstruction. Duodenum is normally positioned as is the ligament of Treitz. Proximal small bowel unremarkable. Patient is status post right hemicolectomy. There is wall thickening in the neo terminal ileum with a potential intraluminal soft tissue pedunculated polyp visible on image 109 series 2. This wall thickening extends into the ileocolic anastomosis and there is asymmetric wall thickening in the colon just distal to the anastomosis. Edema/inflammation is present in the mesentery around the anastomosis and small lymph nodes in this region that were present previously have progressed in the interval with a 15 mm lymph node seen on image 91 series 2. The pericolonic stranding extends down  the sigmoid colon to the region of the rectum were there are multiple tiny perirectal nodules and potential thickening in the anterior and right rectal wall. Vascular/Lymphatic: There is abdominal aortic atherosclerosis without aneurysm. No gastrohepatic or hepatoduodenal ligament  lymphadenopathy. See above for mild mesenteric Lymphadenopathy. No retroperitoneal lymphadenopathy. No pelvic sidewall lymphadenopathy. Reproductive: The prostate gland and seminal vesicles have normal imaging features. Other: No intraperitoneal free fluid. Musculoskeletal: Bone windows reveal no worrisome lytic or sclerotic osseous lesions. IMPRESSION: 1. Status post right hemicolectomy with circumferential wall thickening in the neo terminal ileum extending into the ileocolic anastomosis and having asymmetric wall thickening in the proximal post anastomotic colon. There is pericolonic edema/inflammation tracking down the remaining segment of the colon to the rectum with interval increase in size of previously seen mesenteric lymph nodes adjacent to the anastomosis and interval development of multiple tiny perirectal nodules. These imaging features may be related to inflammatory bowel disease although infectious etiology neoplasm could have this appearance. 2. Apparent wall thickening in the rectum. 3. Potential pedunculated polyp in the neo terminal ileum. 4.  Aortic Atherosclerois (ICD10-170.0) 5. Multiple tiny pulmonary nodules in the lower lungs. Many of these were seen on the previous abdomen and pelvis CT and are stable in the interval. One particular 3 mm nodule in the left lower lobe was not included on the prior study and attention to this on follow-up imaging is recommended. Electronically Signed   By: Misty Stanley M.D.   On: 11/27/2016 15:40   Ct Chest Wo Contrast  Result Date: 11/27/2016 CLINICAL DATA:  Fatigue and diarrhea. Generalized abdominal pain for 1 month. Remote history of follicular lymphoma and colon cancer.  EXAM: CT CHEST, ABDOMEN AND PELVIS WITHOUT CONTRAST TECHNIQUE: Multidetector CT imaging of the chest, abdomen and pelvis was performed following the standard protocol without IV contrast. COMPARISON:  Abdomen and pelvis CT from 07/01/2013.    Marland Kitchen FINDINGS: CT CHEST FINDINGS Cardiovascular: The heart size is normal. No pericardial effusion. Coronary artery calcification is evident. Atherosclerotic calcification is noted in the wall of the thoracic aorta. Mediastinum/Nodes: No mediastinal lymphadenopathy. No evidence for gross hilar lymphadenopathy although assessment is limited by the lack of intravenous contrast on today's study. The esophagus has normal imaging features. There is no axillary lymphadenopathy. Lungs/Pleura: 5 mm right middle lobe pulmonary nodule seen image 100 series 4. 4 mm right middle lobe pulmonary nodule seen on the same image. These are both stable since prior 2015 exam. 5 mm right lower lobe pulmonary nodule seen image 119 was also present on the previous study. 5 mm right perifissural nodule seen on image 94, unchanged. 6 mm left lower lobe nodule on image 103 is stable. 3 mm left lower lobe nodule on image 80 was not included on the prior study. Musculoskeletal: Bone windows reveal no worrisome lytic or sclerotic osseous lesions. CT ABDOMEN PELVIS FINDINGS Hepatobiliary: The liver shows diffusely decreased attenuation suggesting steatosis. There is no evidence for gallstones, gallbladder wall thickening, or pericholecystic fluid. No intrahepatic or extrahepatic biliary dilation. Pancreas: No focal mass lesion. No dilatation of the main duct. No intraparenchymal cyst. No peripancreatic edema. Spleen: No splenomegaly. No focal mass lesion. Adrenals/Urinary Tract: No adrenal nodule or mass. Kidneys unremarkable. No evidence for hydroureter. The urinary bladder appears normal for the degree of distention. Stomach/Bowel: Stomach is nondistended. No gastric wall thickening. No evidence of outlet  obstruction. Duodenum is normally positioned as is the ligament of Treitz. Proximal small bowel unremarkable. Patient is status post right hemicolectomy. There is wall thickening in the neo terminal ileum with a potential intraluminal soft tissue pedunculated polyp visible on image 109 series 2. This wall thickening extends into the ileocolic anastomosis and there is asymmetric wall thickening in the colon just  distal to the anastomosis. Edema/inflammation is present in the mesentery around the anastomosis and small lymph nodes in this region that were present previously have progressed in the interval with a 15 mm lymph node seen on image 91 series 2. The pericolonic stranding extends down the sigmoid colon to the region of the rectum were there are multiple tiny perirectal nodules and potential thickening in the anterior and right rectal wall. Vascular/Lymphatic: There is abdominal aortic atherosclerosis without aneurysm. No gastrohepatic or hepatoduodenal ligament lymphadenopathy. See above for mild mesenteric Lymphadenopathy. No retroperitoneal lymphadenopathy. No pelvic sidewall lymphadenopathy. Reproductive: The prostate gland and seminal vesicles have normal imaging features. Other: No intraperitoneal free fluid. Musculoskeletal: Bone windows reveal no worrisome lytic or sclerotic osseous lesions. IMPRESSION: 1. Status post right hemicolectomy with circumferential wall thickening in the neo terminal ileum extending into the ileocolic anastomosis and having asymmetric wall thickening in the proximal post anastomotic colon. There is pericolonic edema/inflammation tracking down the remaining segment of the colon to the rectum with interval increase in size of previously seen mesenteric lymph nodes adjacent to the anastomosis and interval development of multiple tiny perirectal nodules. These imaging features may be related to inflammatory bowel disease although infectious etiology neoplasm could have this  appearance. 2. Apparent wall thickening in the rectum. 3. Potential pedunculated polyp in the neo terminal ileum. 4.  Aortic Atherosclerois (ICD10-170.0) 5. Multiple tiny pulmonary nodules in the lower lungs. Many of these were seen on the previous abdomen and pelvis CT and are stable in the interval. One particular 3 mm nodule in the left lower lobe was not included on the prior study and attention to this on follow-up imaging is recommended. Electronically Signed   By: Misty Stanley M.D.   On: 11/27/2016 15:40    ASSESSMENT & PLAN:   1.Follicular NHL Patient has never required treatment for this.  no obvious progression by CT [facility] 11-2015 -CT abd findings were likely related to his E Coli colonic infection and has clinically resolved with levofloxacin rx from our clinic. . Lab Results  Component Value Date   LDH 124 (L) 12/02/2016   Plan -Patient has no clinical or lab or overt radiographic evidence of lymphoma progression at this time. -No overtly palpable significantly enlarged peripheral lymphadenopathy. LDH level is within normal limits. No reported constitutional symptoms.  -will need to be monitored with interval imaging.   2. Iron deficiency anemia previously related to GI bleeding from nonmalignant inflammatory anastomotic colon polyp.  Iron deficiency persistent. Mildly worsened Hgb today -will setup for IV injectafer weekly x 2 doses beginning at the end of Jan 2019 per patient preference. -continue maintenance Iron polysaccharide 150mg  daily  3.remote history of synchronous colon cancers. Colonoscopy planned 12/08-2016 by Dr Amedeo Plenty, has been performed. We do not have records yet, however, the patient reported that Dr Amedeo Plenty felt positive about his results. We will follow up on this.   CT abd -- showed inflammation  PLAN -he is still on dual antiplatelet therapy which increase his risk of chronic GI losses. -reasonable to consider PO Iron replacement with with  PCP. We will check his counts again in 3-5mo. Also to begin IV Iron at the end of Jan 2019.  -Plan to obtain stool studies to discover reason for loose stools/diarrhea - done and showed enteropathogenic E.Coli -- given prescription for levofloxacin, resolved. =recent colonoscopy- no evidence of malignancy per patient -- report not available to [place]. -We will continue to f/u 63mo   IV Injectafer  weekly x 2 doses (Starting end of January 2019) RTC with Dr Irene Limbo in 4 months with labs  I spent 20 minutes counseling the patient face to face. The total time spent in the appointment was 25 minutes and more than 50% was on counseling and direct patient cares.    Sullivan Lone MD [place] AAHIVMS [facility] Hematology/Oncology Physician [facility]  (Office):       [phone] (Work cell):  [phone] (Fax):           [phone]  This document serves as a record of services personally performed by Sullivan Lone, MD. It was created on his behalf by Reola Mosher, a trained medical scribe. The creation of this record is based on the scribe's personal observations and the provider's statements to them.   .I have reviewed the above documentation for accuracy and completeness, and I agree with the above. Brunetta Genera MD MS

## 2016-12-26 NOTE — Telephone Encounter (Signed)
Pt has been made aware that Dr. Tamala Julian had no further recommendations.

## 2016-12-26 NOTE — Telephone Encounter (Signed)
Called and left pt a message letting him know that I have not called since we spoke yesterday and I was waiting to hear back from Dr. Tamala Julian about what we talked about yesterday.  Advised to call back if any questions, otherwise I would call once I hear back from Dr. Tamala Julian.

## 2017-01-20 DIAGNOSIS — H40013 Open angle with borderline findings, low risk, bilateral: Secondary | ICD-10-CM | POA: Diagnosis not present

## 2017-01-27 ENCOUNTER — Inpatient Hospital Stay: Payer: PPO

## 2017-01-27 ENCOUNTER — Telehealth: Payer: Self-pay | Admitting: Hematology

## 2017-01-27 ENCOUNTER — Inpatient Hospital Stay: Payer: PPO | Attending: Hematology | Admitting: Hematology

## 2017-01-27 ENCOUNTER — Other Ambulatory Visit: Payer: Self-pay | Admitting: Hematology

## 2017-01-27 DIAGNOSIS — Z85038 Personal history of other malignant neoplasm of large intestine: Secondary | ICD-10-CM

## 2017-01-27 DIAGNOSIS — D509 Iron deficiency anemia, unspecified: Secondary | ICD-10-CM | POA: Diagnosis not present

## 2017-01-27 DIAGNOSIS — C8203 Follicular lymphoma grade I, intra-abdominal lymph nodes: Secondary | ICD-10-CM

## 2017-01-27 DIAGNOSIS — D500 Iron deficiency anemia secondary to blood loss (chronic): Secondary | ICD-10-CM

## 2017-01-27 LAB — COMPREHENSIVE METABOLIC PANEL
ALBUMIN: 3.5 g/dL (ref 3.5–5.0)
ALK PHOS: 50 U/L (ref 40–150)
ALT: 23 U/L (ref 0–55)
AST: 18 U/L (ref 5–34)
Anion gap: 8 (ref 3–11)
BUN: 13 mg/dL (ref 7–26)
CO2: 22 mmol/L (ref 22–29)
Calcium: 8.8 mg/dL (ref 8.4–10.4)
Chloride: 111 mmol/L — ABNORMAL HIGH (ref 98–109)
Creatinine, Ser: 1.13 mg/dL (ref 0.70–1.30)
GFR calc Af Amer: >60 mL/min (ref >60)
GFR calc non Af Amer: >60 mL/min (ref >60)
Glucose, Bld: 148 mg/dL — ABNORMAL HIGH (ref 70–140)
Potassium: 4.7 mmol/L (ref 3.5–5.1)
SODIUM: 141 mmol/L (ref 136–145)
TOTAL PROTEIN: 6.2 g/dL — AB (ref 6.4–8.3)
Total Bilirubin: 0.5 mg/dL (ref 0.2–1.2)

## 2017-01-27 LAB — CBC WITH DIFFERENTIAL/PLATELET
BASOS ABS: 0 10*3/uL (ref 0.0–0.1)
Basophils Relative: 1 %
Eosinophils Absolute: 0.1 10*3/uL (ref 0.0–0.5)
Eosinophils Relative: 1 %
HCT: 36.7 % — ABNORMAL LOW (ref 38.4–49.9)
Hemoglobin: 11.1 g/dL — ABNORMAL LOW (ref 13.0–17.1)
LYMPHS PCT: 27 %
Lymphs Abs: 1.7 10*3/uL (ref 0.9–3.3)
MCH: 27.6 pg (ref 27.2–33.4)
MCHC: 30.2 g/dL — ABNORMAL LOW (ref 32.0–36.0)
MCV: 91.3 fL (ref 79.3–98.0)
Monocytes Absolute: 0.7 10*3/uL (ref 0.1–0.9)
Monocytes Relative: 11 %
NEUTROS PCT: 60 %
Neutro Abs: 3.8 10*3/uL (ref 1.5–6.5)
Platelets: 177 10*3/uL (ref 140–400)
RBC: 4.02 MIL/uL — AB (ref 4.20–5.82)
RDW: 14.8 % (ref 11.0–15.6)
WBC: 6.3 10*3/uL (ref 4.0–10.3)

## 2017-01-27 LAB — RETICULOCYTES
RBC.: 4.02 MIL/uL — ABNORMAL LOW (ref 4.20–5.82)
RETIC COUNT ABSOLUTE: 60.3 10*3/uL (ref 34.8–93.9)
RETIC CT PCT: 1.5 % (ref 0.8–1.8)

## 2017-01-27 LAB — FERRITIN: Ferritin: 12 ng/mL — ABNORMAL LOW (ref 22–316)

## 2017-01-27 LAB — CEA (IN HOUSE-CHCC): CEA (CHCC-IN HOUSE): 2.16 ng/mL (ref 0.00–5.00)

## 2017-01-27 LAB — LACTATE DEHYDROGENASE: LDH: 143 U/L (ref 125–245)

## 2017-01-27 NOTE — Telephone Encounter (Signed)
Gave avs and calendar for may  °

## 2017-01-27 NOTE — Progress Notes (Signed)
This encounter was created in error - please disregard.

## 2017-02-03 ENCOUNTER — Inpatient Hospital Stay: Payer: PPO

## 2017-02-03 VITALS — BP 151/71 | HR 64 | Temp 98.0°F | Resp 18

## 2017-02-03 DIAGNOSIS — D500 Iron deficiency anemia secondary to blood loss (chronic): Secondary | ICD-10-CM

## 2017-02-03 DIAGNOSIS — D509 Iron deficiency anemia, unspecified: Secondary | ICD-10-CM | POA: Diagnosis not present

## 2017-02-03 DIAGNOSIS — I2510 Atherosclerotic heart disease of native coronary artery without angina pectoris: Secondary | ICD-10-CM | POA: Diagnosis not present

## 2017-02-03 DIAGNOSIS — I252 Old myocardial infarction: Secondary | ICD-10-CM | POA: Diagnosis not present

## 2017-02-03 DIAGNOSIS — E782 Mixed hyperlipidemia: Secondary | ICD-10-CM | POA: Diagnosis not present

## 2017-02-03 DIAGNOSIS — E1142 Type 2 diabetes mellitus with diabetic polyneuropathy: Secondary | ICD-10-CM | POA: Diagnosis not present

## 2017-02-03 DIAGNOSIS — C189 Malignant neoplasm of colon, unspecified: Secondary | ICD-10-CM | POA: Diagnosis not present

## 2017-02-03 DIAGNOSIS — I10 Essential (primary) hypertension: Secondary | ICD-10-CM | POA: Diagnosis not present

## 2017-02-03 DIAGNOSIS — N400 Enlarged prostate without lower urinary tract symptoms: Secondary | ICD-10-CM | POA: Diagnosis not present

## 2017-02-03 DIAGNOSIS — M1990 Unspecified osteoarthritis, unspecified site: Secondary | ICD-10-CM | POA: Diagnosis not present

## 2017-02-03 MED ORDER — SODIUM CHLORIDE 0.9 % IV SOLN
750.0000 mg | Freq: Once | INTRAVENOUS | Status: AC
  Administered 2017-02-03: 750 mg via INTRAVENOUS
  Filled 2017-02-03: qty 15

## 2017-02-03 NOTE — Patient Instructions (Signed)
Ferric carboxymaltose injection What is this medicine? FERRIC CARBOXYMALTOSE (ferr-ik car-box-ee-mol-toes) is an iron complex. Iron is used to make healthy red blood cells, which carry oxygen and nutrients throughout the body. This medicine is used to treat anemia in people with chronic kidney disease or people who cannot take iron by mouth. This medicine may be used for other purposes; ask your health care provider or pharmacist if you have questions. COMMON BRAND NAME(S): Injectafer What should I tell my health care provider before I take this medicine? They need to know if you have any of these conditions: -anemia not caused by low iron levels -high levels of iron in the blood -liver disease -an unusual or allergic reaction to iron, other medicines, foods, dyes, or preservatives -pregnant or trying to get pregnant -breast-feeding How should I use this medicine? This medicine is for infusion into a vein. It is given by a health care professional in a hospital or clinic setting. Talk to your pediatrician regarding the use of this medicine in children. Special care may be needed. Overdosage: If you think you have taken too much of this medicine contact a poison control center or emergency room at once. NOTE: This medicine is only for you. Do not share this medicine with others. What if I miss a dose? It is important not to miss your dose. Call your doctor or health care professional if you are unable to keep an appointment. What may interact with this medicine? Do not take this medicine with any of the following medications: -deferoxamine -dimercaprol -other iron products This medicine may also interact with the following medications: -chloramphenicol -deferasirox This list may not describe all possible interactions. Give your health care provider a list of all the medicines, herbs, non-prescription drugs, or dietary supplements you use. Also tell them if you smoke, drink alcohol, or use  illegal drugs. Some items may interact with your medicine. What should I watch for while using this medicine? Visit your doctor or health care professional regularly. Tell your doctor if your symptoms do not start to get better or if they get worse. You may need blood work done while you are taking this medicine. You may need to follow a special diet. Talk to your doctor. Foods that contain iron include: whole grains/cereals, dried fruits, beans, or peas, leafy green vegetables, and organ meats (liver, kidney). What side effects may I notice from receiving this medicine? Side effects that you should report to your doctor or health care professional as soon as possible: -allergic reactions like skin rash, itching or hives, swelling of the face, lips, or tongue -breathing problems -changes in blood pressure -feeling faint or lightheaded, falls -flushing, sweating, or hot feelings Side effects that usually do not require medical attention (report to your doctor or health care professional if they continue or are bothersome): -changes in taste -constipation -dizziness -headache -nausea -pain, redness, or irritation at site where injected -vomiting This list may not describe all possible side effects. Call your doctor for medical advice about side effects. You may report side effects to FDA at 1-800-FDA-1088. Where should I keep my medicine? This drug is given in a hospital or clinic and will not be stored at home. NOTE: This sheet is a summary. It may not cover all possible information. If you have questions about this medicine, talk to your doctor, pharmacist, or health care provider.  2018 Elsevier/Gold Standard (2015-01-26 11:20:47)  

## 2017-02-10 ENCOUNTER — Inpatient Hospital Stay: Payer: PPO | Attending: Hematology

## 2017-02-10 VITALS — BP 144/64 | HR 61 | Temp 98.5°F | Resp 18

## 2017-02-10 DIAGNOSIS — D509 Iron deficiency anemia, unspecified: Secondary | ICD-10-CM

## 2017-02-10 DIAGNOSIS — D500 Iron deficiency anemia secondary to blood loss (chronic): Secondary | ICD-10-CM

## 2017-02-10 MED ORDER — SODIUM CHLORIDE 0.9 % IV SOLN
Freq: Once | INTRAVENOUS | Status: AC
  Administered 2017-02-10: 11:00:00 via INTRAVENOUS

## 2017-02-10 MED ORDER — FERRIC CARBOXYMALTOSE 750 MG/15ML IV SOLN
750.0000 mg | Freq: Once | INTRAVENOUS | Status: AC
  Administered 2017-02-10: 750 mg via INTRAVENOUS
  Filled 2017-02-10: qty 15

## 2017-02-10 NOTE — Patient Instructions (Signed)
Ferric carboxymaltose injection What is this medicine? FERRIC CARBOXYMALTOSE (ferr-ik car-box-ee-mol-toes) is an iron complex. Iron is used to make healthy red blood cells, which carry oxygen and nutrients throughout the body. This medicine is used to treat anemia in people with chronic kidney disease or people who cannot take iron by mouth. This medicine may be used for other purposes; ask your health care provider or pharmacist if you have questions. COMMON BRAND NAME(S): Injectafer What should I tell my health care provider before I take this medicine? They need to know if you have any of these conditions: -anemia not caused by low iron levels -high levels of iron in the blood -liver disease -an unusual or allergic reaction to iron, other medicines, foods, dyes, or preservatives -pregnant or trying to get pregnant -breast-feeding How should I use this medicine? This medicine is for infusion into a vein. It is given by a health care professional in a hospital or clinic setting. Talk to your pediatrician regarding the use of this medicine in children. Special care may be needed. Overdosage: If you think you have taken too much of this medicine contact a poison control center or emergency room at once. NOTE: This medicine is only for you. Do not share this medicine with others. What if I miss a dose? It is important not to miss your dose. Call your doctor or health care professional if you are unable to keep an appointment. What may interact with this medicine? Do not take this medicine with any of the following medications: -deferoxamine -dimercaprol -other iron products This medicine may also interact with the following medications: -chloramphenicol -deferasirox This list may not describe all possible interactions. Give your health care provider a list of all the medicines, herbs, non-prescription drugs, or dietary supplements you use. Also tell them if you smoke, drink alcohol, or use  illegal drugs. Some items may interact with your medicine. What should I watch for while using this medicine? Visit your doctor or health care professional regularly. Tell your doctor if your symptoms do not start to get better or if they get worse. You may need blood work done while you are taking this medicine. You may need to follow a special diet. Talk to your doctor. Foods that contain iron include: whole grains/cereals, dried fruits, beans, or peas, leafy green vegetables, and organ meats (liver, kidney). What side effects may I notice from receiving this medicine? Side effects that you should report to your doctor or health care professional as soon as possible: -allergic reactions like skin rash, itching or hives, swelling of the face, lips, or tongue -breathing problems -changes in blood pressure -feeling faint or lightheaded, falls -flushing, sweating, or hot feelings Side effects that usually do not require medical attention (report to your doctor or health care professional if they continue or are bothersome): -changes in taste -constipation -dizziness -headache -nausea -pain, redness, or irritation at site where injected -vomiting This list may not describe all possible side effects. Call your doctor for medical advice about side effects. You may report side effects to FDA at 1-800-FDA-1088. Where should I keep my medicine? This drug is given in a hospital or clinic and will not be stored at home. NOTE: This sheet is a summary. It may not cover all possible information. If you have questions about this medicine, talk to your doctor, pharmacist, or health care provider.  2018 Elsevier/Gold Standard (2015-01-26 11:20:47)  

## 2017-02-12 DIAGNOSIS — H3581 Retinal edema: Secondary | ICD-10-CM | POA: Diagnosis not present

## 2017-02-12 DIAGNOSIS — H35372 Puckering of macula, left eye: Secondary | ICD-10-CM | POA: Diagnosis not present

## 2017-02-12 DIAGNOSIS — E113292 Type 2 diabetes mellitus with mild nonproliferative diabetic retinopathy without macular edema, left eye: Secondary | ICD-10-CM | POA: Diagnosis not present

## 2017-02-12 DIAGNOSIS — H353122 Nonexudative age-related macular degeneration, left eye, intermediate dry stage: Secondary | ICD-10-CM | POA: Diagnosis not present

## 2017-02-26 ENCOUNTER — Telehealth: Payer: Self-pay | Admitting: Interventional Cardiology

## 2017-02-26 NOTE — Telephone Encounter (Signed)
New message       [facility] Pre-operative Risk Assessment    Request for surgical clearance:  1. What type of surgery is being performed? Membrane peel, vitrectomy  2. When is this surgery scheduled? 2/28  3. What type of clearance is required (medical clearance vs. Pharmacy clearance to hold med vs. Both)? medical  4. Are there any medications that need to be held prior to surgery and how long? Aspirin and Plavix  5. Practice name and name of physician performing surgery?Dr  Sherlynn Stalls  6. What is your office phone and fax number? FAX  [id number], phone [phone]  7. Anesthesia type (None, local, MAC, general) ? [place] 02/26/2017, 4:39 PM  _________________________________________________________________   (provider comments below)

## 2017-02-27 ENCOUNTER — Telehealth: Payer: Self-pay | Admitting: Interventional Cardiology

## 2017-02-27 NOTE — Telephone Encounter (Signed)
F/U Call:  [facility] calling, would like clearance faxed to [phone].

## 2017-02-27 NOTE — Telephone Encounter (Signed)
   Primary Cardiologist: Sinclair Grooms, MD  Chart reviewed as part of pre-operative protocol coverage. Patient was contacted 02/27/2017 in reference to pre-operative risk assessment for pending surgery as outlined below.  Mark Nickel. was last seen on 12/12/2016 by Molli Hazard, NP.  Since that day, Mark Nickel. has done well.  He has no changes in his condition or exercise tolerance.   He is to hold the aspirin and Plavix 7 days for the procedure, this is acceptable.  Restart the medications as soon after the procedure is possible.  Therefore, based on ACC/AHA guidelines, the patient would be at acceptable risk for the planned procedure without further cardiovascular testing.   I will route this recommendation to the requesting party via Epic fax function and remove from pre-op pool.  Please call with questions.  Rosaria Ferries, PA-C 02/27/2017, 2:07 PM

## 2017-02-27 NOTE — Telephone Encounter (Signed)
New Message   Pt calling to check on his surgical clearance to hold meds

## 2017-02-28 NOTE — Telephone Encounter (Signed)
Clearance faxed to Dr. Baird Cancer at [phone]

## 2017-03-06 DIAGNOSIS — H3581 Retinal edema: Secondary | ICD-10-CM | POA: Diagnosis not present

## 2017-03-06 DIAGNOSIS — H35372 Puckering of macula, left eye: Secondary | ICD-10-CM | POA: Diagnosis not present

## 2017-03-06 DIAGNOSIS — H3582 Retinal ischemia: Secondary | ICD-10-CM | POA: Diagnosis not present

## 2017-03-06 DIAGNOSIS — E113312 Type 2 diabetes mellitus with moderate nonproliferative diabetic retinopathy with macular edema, left eye: Secondary | ICD-10-CM | POA: Diagnosis not present

## 2017-03-07 DIAGNOSIS — E113292 Type 2 diabetes mellitus with mild nonproliferative diabetic retinopathy without macular edema, left eye: Secondary | ICD-10-CM | POA: Diagnosis not present

## 2017-03-13 ENCOUNTER — Other Ambulatory Visit: Payer: Self-pay | Admitting: Interventional Cardiology

## 2017-03-14 DIAGNOSIS — H3581 Retinal edema: Secondary | ICD-10-CM | POA: Diagnosis not present

## 2017-03-14 DIAGNOSIS — H35372 Puckering of macula, left eye: Secondary | ICD-10-CM | POA: Diagnosis not present

## 2017-04-08 DIAGNOSIS — H35372 Puckering of macula, left eye: Secondary | ICD-10-CM | POA: Diagnosis not present

## 2017-04-08 DIAGNOSIS — H3581 Retinal edema: Secondary | ICD-10-CM | POA: Diagnosis not present

## 2017-04-13 IMAGING — CR DG WRIST COMPLETE 3+V*L*
3 series · 3 of 3 positions shown · non-contrast
Comparison: None

CLINICAL DATA: Fell at home this morning, tried to catch himself
with LEFT hand, pain and deformity LEFT wrist, initial encounter

EXAM:
LEFT WRIST - COMPLETE 3+ VIEW

[x wrist pa left]
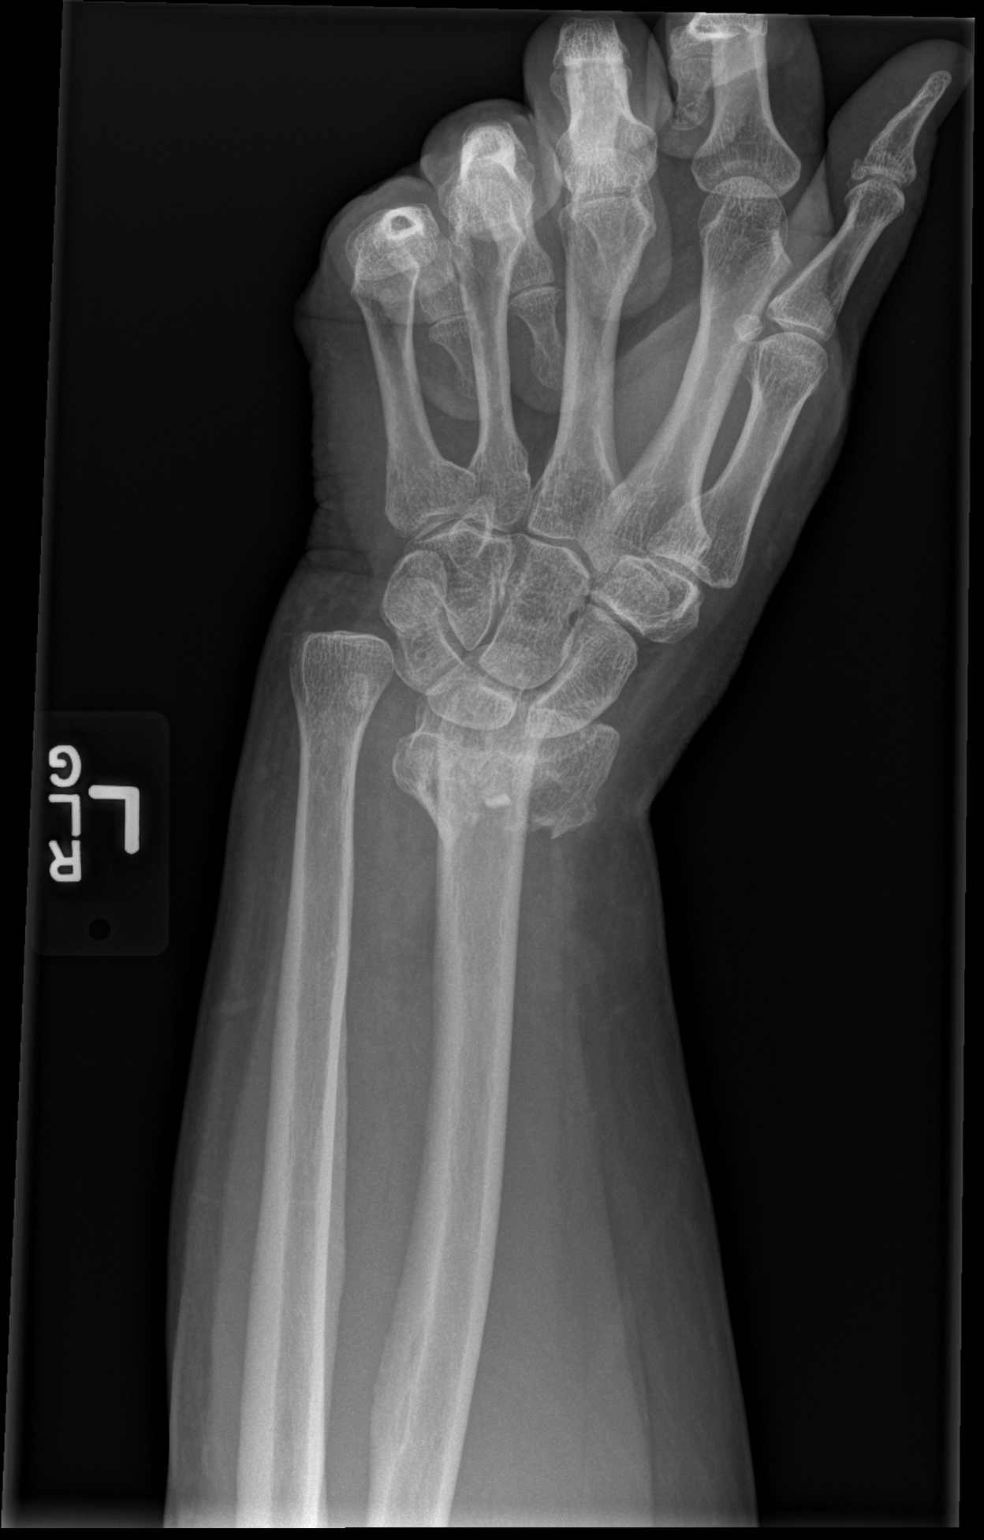

[x wrist obl left]
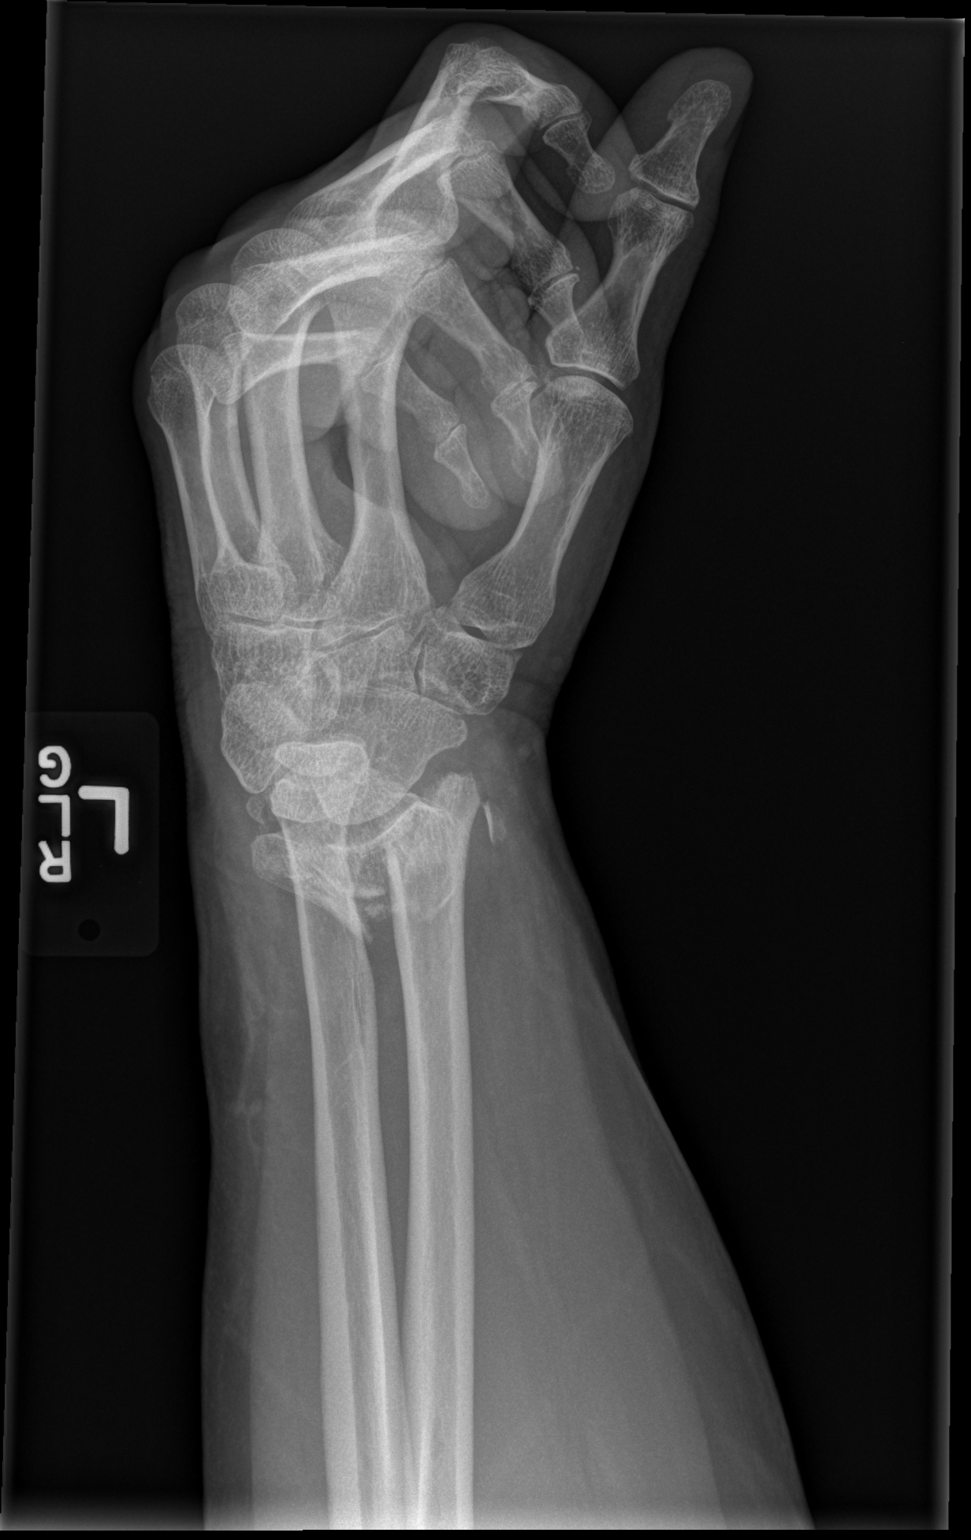

[x wrist lat left]
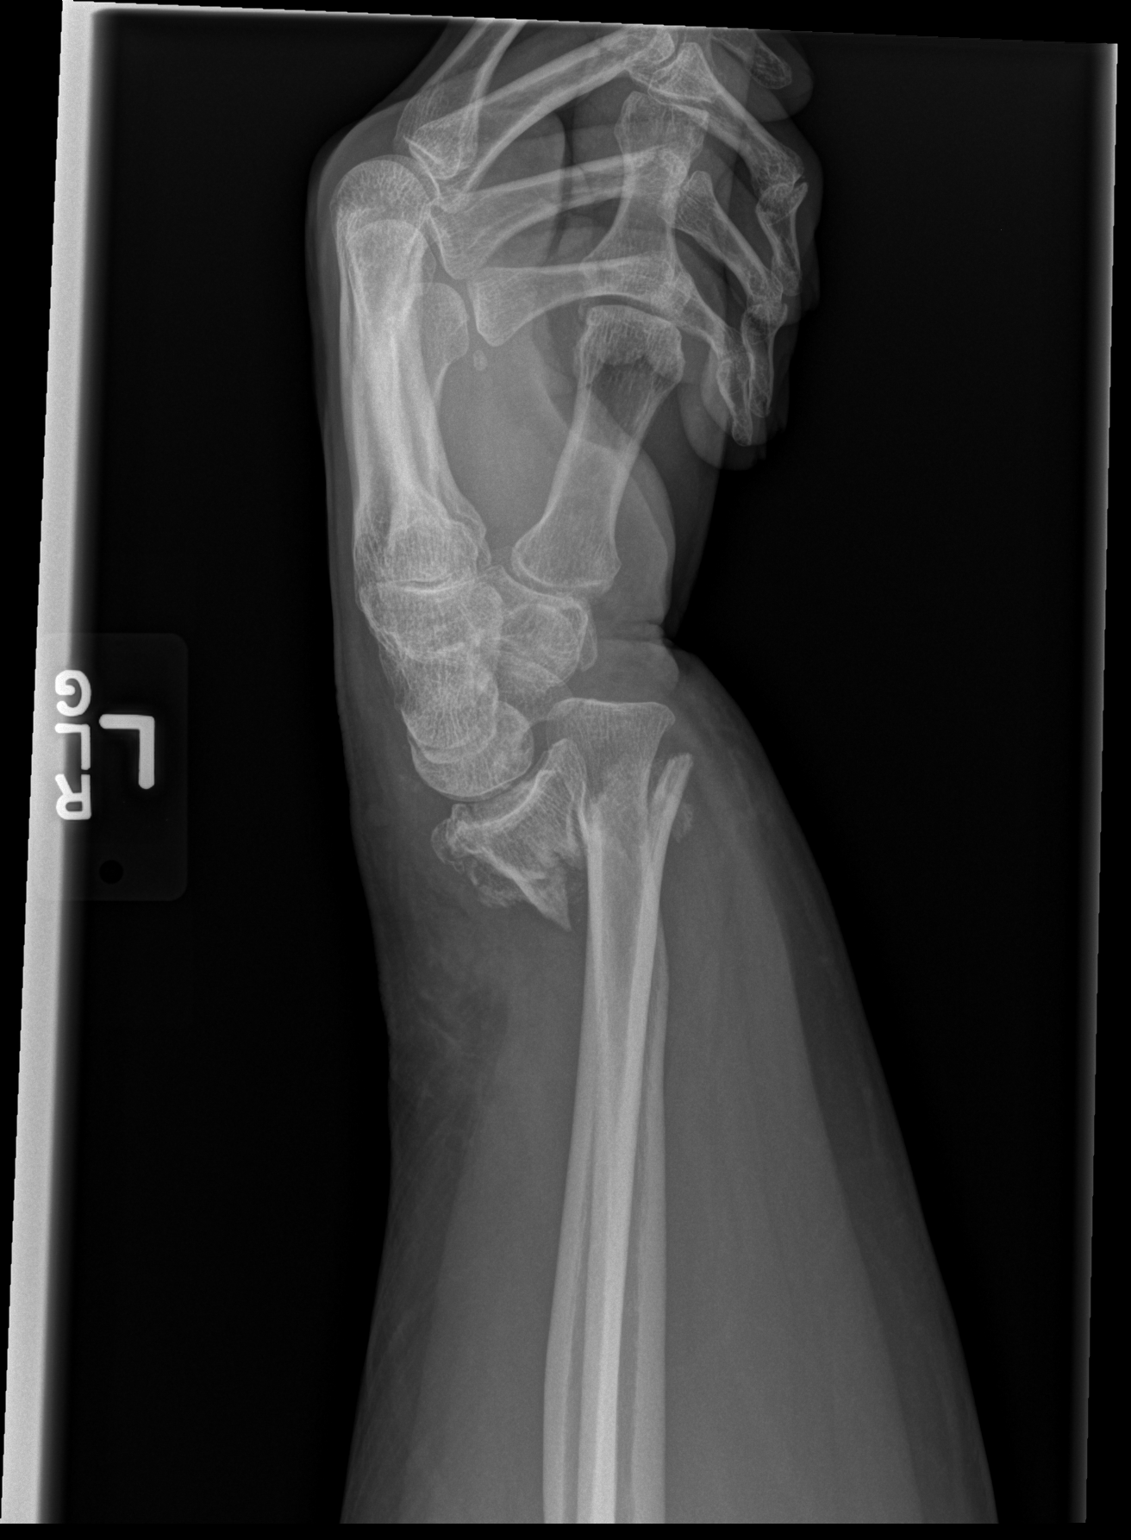

[3 of 3 positions shown; findings below may reference images not displayed]

FINDINGS: Osseous demineralization.

Comminuted distal LEFT radial metaphyseal fracture with
intra-articular extension into the radiocarpal joint.

Significant dorsal displacement with apex volar angulation and
overriding.

Displaced ulnar styloid fracture.

Associated soft tissue swelling and deformity.
IMPRESSION: Displaced LEFT ulnar styloid fracture.

Comminuted displaced angulated intra-articular distal LEFT radial
metaphyseal fracture.

## 2017-04-13 IMAGING — CR DG KNEE COMPLETE 4+V*L*
5 series · 5 of 5 positions shown · non-contrast
Comparison: None

CLINICAL DATA: Fell at home this morning, abrasion anterior LEFT
knee, pain, initial encounter

EXAM:
LEFT KNEE - COMPLETE 4+ VIEW

[t knee ap left]
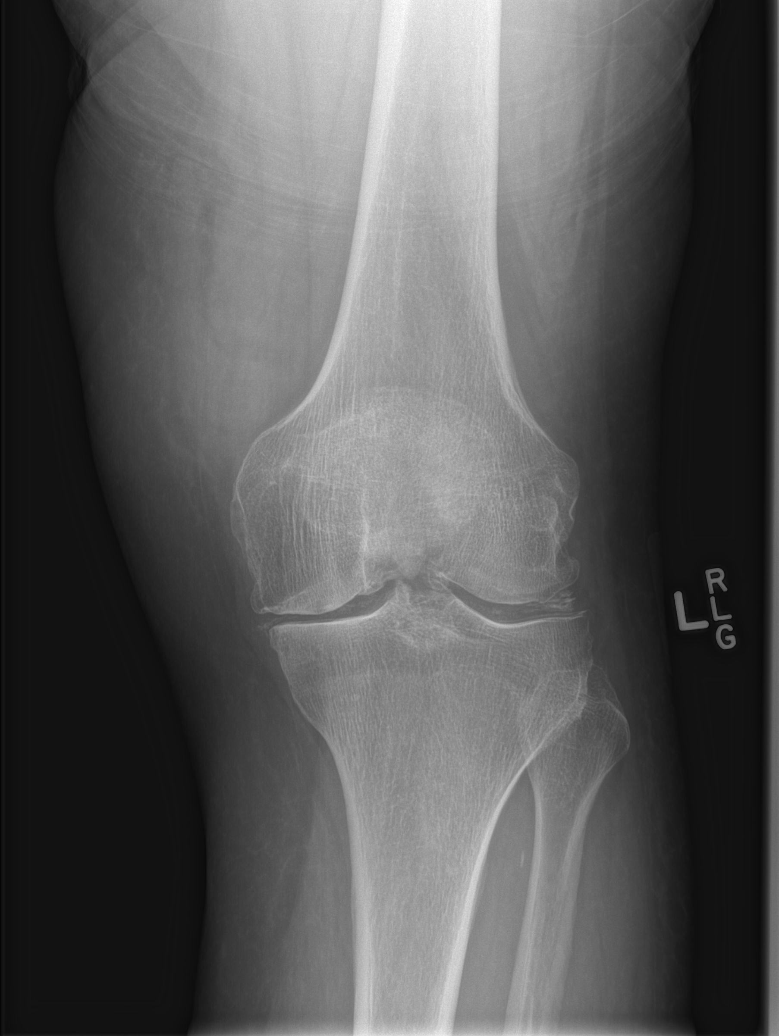

[t knee obl left (1 of 2)]
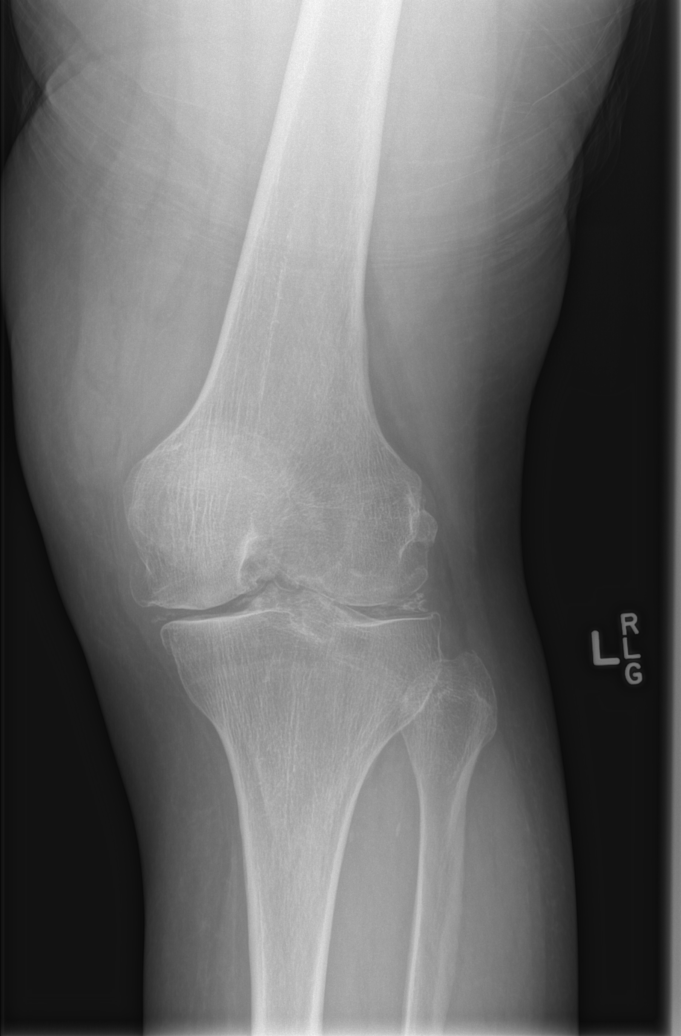

[t knee obl left (2 of 2)]
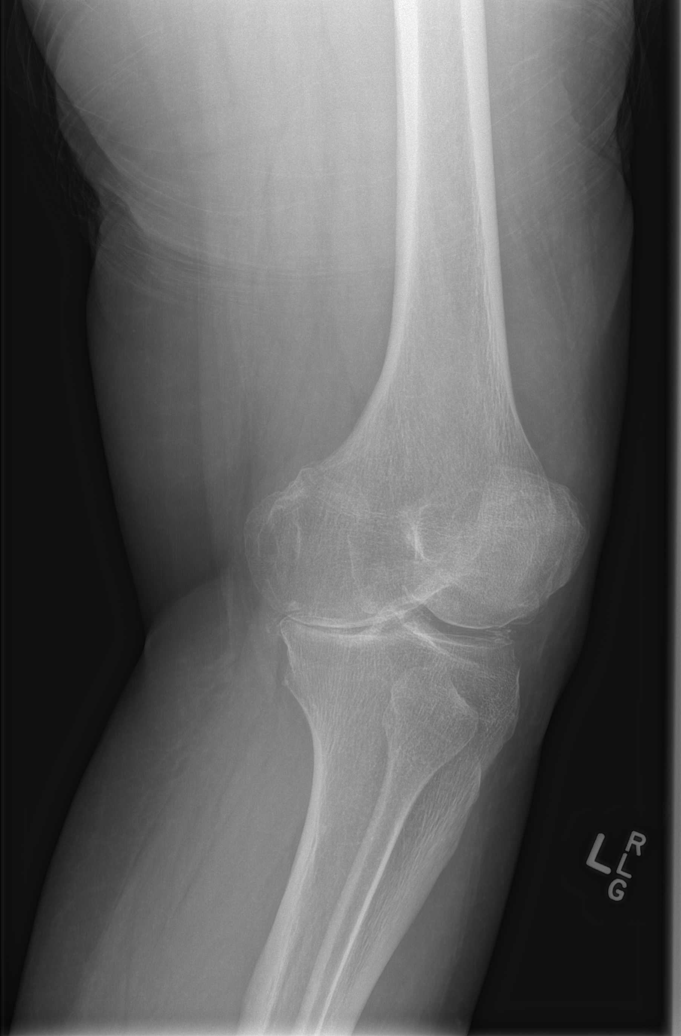

[t knee lat left (1 of 2)]
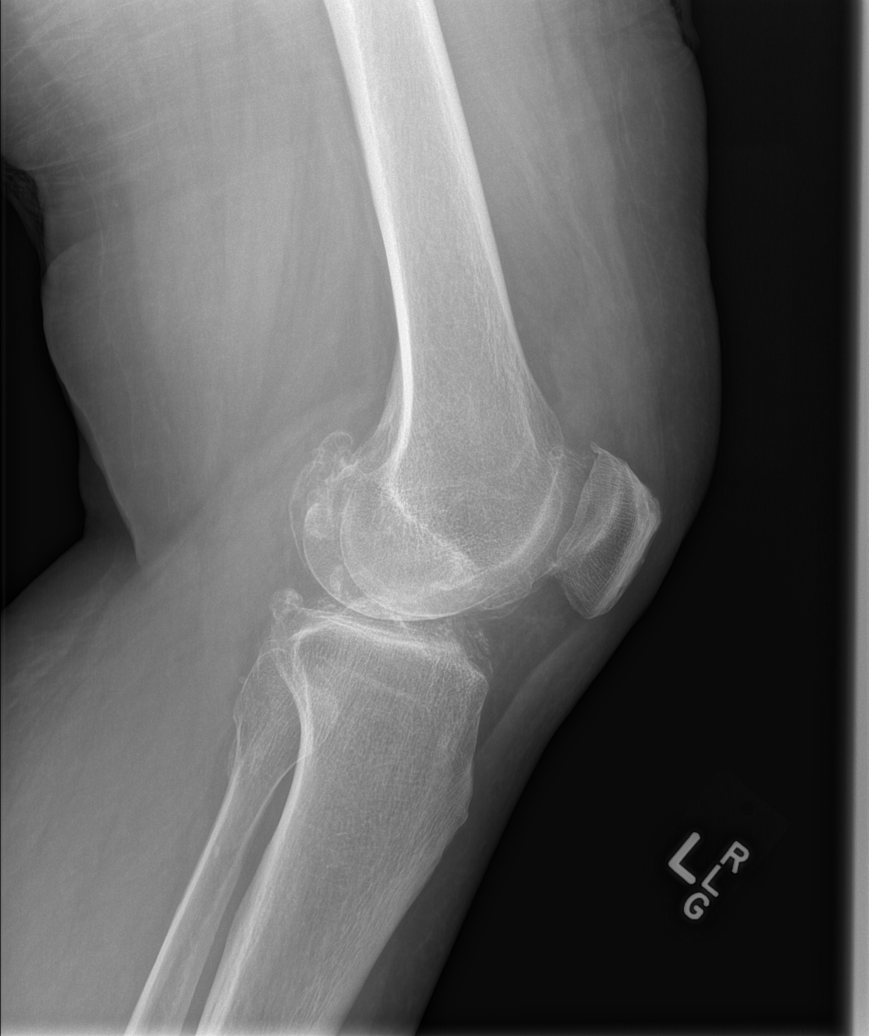

[t knee lat left (2 of 2)]
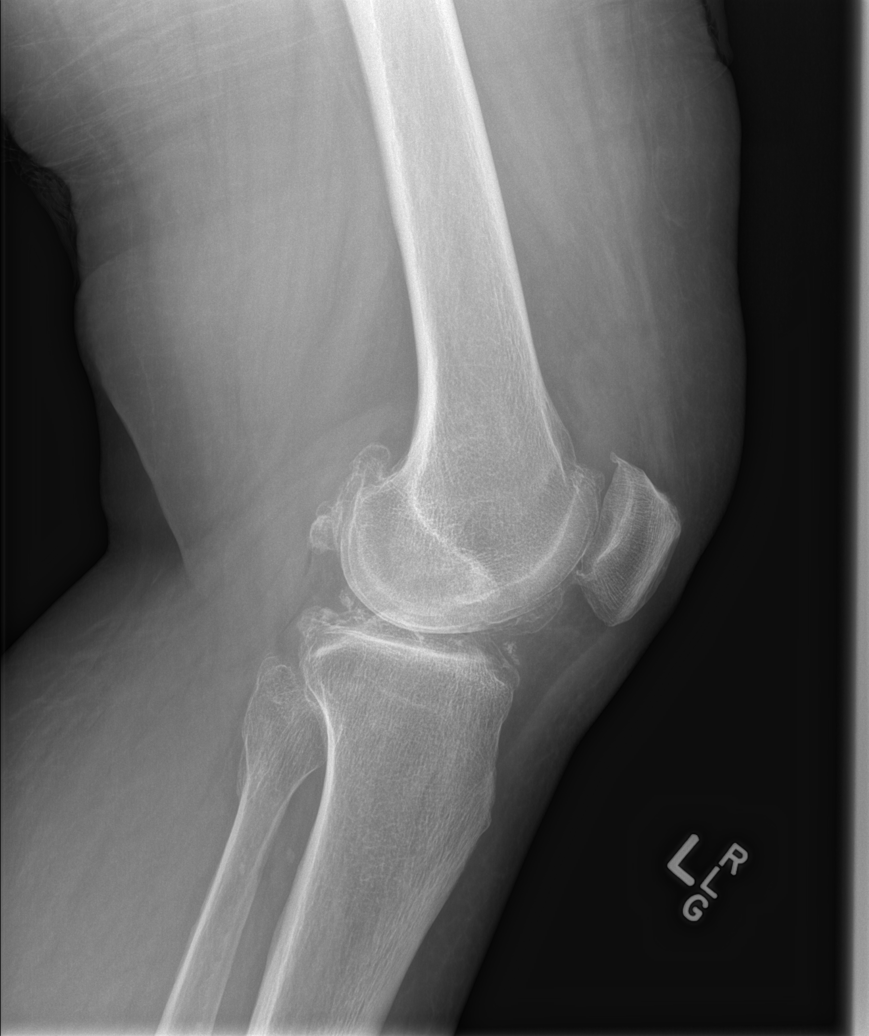

[5 of 5 positions shown; findings below may reference images not displayed]

FINDINGS: Osseous demineralization.

Tricompartmental osteoarthritic changes with joint space narrowing
and spur formation greatest at medial compartment.

Chondrocalcinosis question CPPD.

No significant knee joint effusion.

No acute fracture, dislocation or bone destruction.
IMPRESSION: Osseous demineralization with tricompartmental osteoarthritic
changes and question CPPD.

No acute abnormalities.

## 2017-04-17 NOTE — Progress Notes (Signed)
[facility]  HEMATOLOGY ONCOLOGY PROGRESS NOTE  Date of service: 04/18/17  Patient Care Team: Wenda Low, MD as PCP - General (Internal Medicine) Belva Crome, MD as PCP - Cardiology (Cardiology)  CC:  f/u for  history of colon cancer  low grade follicular NHL   INTERVAL HISTORY:  Mr Mark West" Goin is here for follow-up for his history of colon cancer and low-grade follicular non-Hodgkin's lymphoma which has not required treatment. He has also had history of previous iron deficiency anemia thought to be from colonic polyps. The patient's last visit with [place] was on 12/26/16. The pt reports that he is doing well overall.   The pt reports that he recently had bronchitis but notes that this is completely resolved. He continues Aspirin and Plavix. He notes that his last GI workup was this year.   Of note since the patient's last visit, pt has had CEA lab completed on 01/27/17 which was WNL at 2.16.  Lab results today (04/18/17) of CBC, and Reticulocytes is as follows: all values are WNL except for RDW at 15.8. LDH 04/18/17 is WLN at 153 Ferritin 04/18/17 is improved from 12 to 182.  On review of systems, pt reports eating well, resolved bronchitis, leg swelling, and denies fevers, chills, night sweats, blood in the stools, black stools, and any other symptoms.   REVIEW OF SYSTEMS:   .10 Point review of Systems was done is negative except as noted above.  . Past Medical History:  Diagnosis Date  . Anemia, iron deficiency 01/26/2011  . Arthritis   . Asthma   . Cancer (HCC)    nhl, colon ca  . Carotid artery occlusion    right occlusion of carotid with moderate left plaque  . Coronary artery disease August 2005   MI with stent  . Diabetes mellitus   . Hyperlipidemia   . Hypertension   . Kidney stones 05/2010   lithotripsy  . Myocardial infarction ([place])     . Past Surgical History:  Procedure Laterality Date  . CARPAL TUNNEL RELEASE Left 05/08/2015   Procedure: CARPAL  TUNNEL RELEASE;  Surgeon: Charlotte Crumb, MD;  Location: [place];  Service: Orthopedics;  Laterality: Left;  . COLON SURGERY    . COLONOSCOPY    . CORONARY STENT PLACEMENT    . HERNIA REPAIR    . OPEN REDUCTION INTERNAL FIXATION (ORIF) DISTAL RADIAL FRACTURE Left 05/08/2015   Procedure: OPEN REDUCTION INTERNAL FIXATION (ORIF) DISTAL RADIAL FRACTURE, CARPAL TUNNEL RELEASE;  Surgeon: Charlotte Crumb, MD;  Location: [place];  Service: Orthopedics;  Laterality: Left;  . REPAIR OF COMPLEX TRACTION RETINAL DETACHMENT      . Social History   Tobacco Use  . Smoking status: Former Smoker    Packs/day: 1.00    Years: 20.00    Pack years: 20.00    Last attempt to quit: 01/08/1980    Years since quitting: 37.3  . Smokeless tobacco: Never Used  Substance Use Topics  . Alcohol use: No    Alcohol/week: 0.0 oz  . Drug use: No    ALLERGIES:  is allergic to iodine.  MEDICATIONS:  Current Outpatient Medications  Medication Sig Dispense Refill  . aspirin 81 MG tablet Take 81 mg by mouth daily.      [facility] atorvastatin (LIPITOR) 40 MG tablet Take 40 mg by mouth daily.      . clopidogrel (PLAVIX) 75 MG tablet Take 75 mg by mouth daily.      . Coenzyme Q10 (CO Q  10 PO) Take 1 capsule by mouth daily.     . dorzolamide-timolol (COSOPT) 22.3-6.8 MG/ML ophthalmic solution Place 1 drop into both eyes 2 (two) times daily.    . fentaNYL (SUBLIMAZE) 100 MCG/2ML injection Inject 200 mcg into the vein once.    Marland Kitchen glucosamine-chondroitin 500-400 MG tablet Take 1 tablet by mouth daily.     . iron polysaccharides (NIFEREX) 150 MG capsule Take 1 capsule (150 mg total) by mouth daily. 30 capsule 1  . levofloxacin (LEVAQUIN) 500 MG tablet Take 1 tablet (500 mg total) by mouth daily. (Patient not taking: Reported on 12/26/2016) 7 tablet 0  . lisinopril (PRINIVIL,ZESTRIL) 5 MG tablet Take 5 mg by mouth daily.     . Lutein 10 MG TABS Take 1 tablet by mouth daily.     Marland Kitchen LYCOPENE PO Take 1 tablet by mouth daily.     .  metFORMIN (GLUCOPHAGE) 500 MG tablet Take 1,000 mg by mouth 2 (two) times daily with a meal.    . metoprolol (LOPRESSOR) 50 MG tablet Take 25 mg by mouth 2 (two) times daily.    . nitroGLYCERIN (NITROSTAT) 0.4 MG SL tablet Place 1 tablet (0.4 mg total) under the tongue every 5 (five) minutes as needed for chest pain. 25 tablet 3  . Omega-3 Fatty Acids (FISH OIL) 1000 MG CAPS Take 1 capsule by mouth daily.    Marland Kitchen oxyCODONE-acetaminophen (ROXICET) 5-325 MG tablet Take 1 tablet by mouth every 4 (four) hours as needed for severe pain. 30 tablet 0  . pantoprazole (PROTONIX) 40 MG tablet Take 40 mg by mouth daily.     . sitaGLIPtin (JANUVIA) 100 MG tablet Take 100 mg by mouth daily.       No current facility-administered medications for this visit.     PHYSICAL EXAMINATION: ECOG PERFORMANCE STATUS: 2 - Symptomatic, <50% confined to bed  . Vitals:   04/18/17 1005  BP: (!) 168/65  Pulse: 63  Resp: 19  Temp: 98.4 F (36.9 C)  SpO2: 98%    Filed Weights   04/18/17 1005  Weight: 250 lb 14.4 oz (113.8 kg)   .Body mass index is 40.5 kg/m. Marland Kitchen GENERAL:alert, in no acute distress and comfortable SKIN: no acute rashes, no significant lesions EYES: conjunctiva are pink and non-injected, sclera anicteric OROPHARYNX: MMM, no exudates, no oropharyngeal erythema or ulceration NECK: supple, no JVD LYMPH:  no palpable lymphadenopathy in the cervical, axillary or inguinal regions LUNGS: clear to auscultation b/l with normal respiratory effort HEART: regular rate & rhythm ABDOMEN:  normoactive bowel sounds , non tender, not distended. Extremity: no pedal edema PSYCH: alert & oriented x 3 with fluent speech NEURO: no focal motor/sensory deficits  LABORATORY DATA:   I have reviewed the data as listed  . CBC Latest Ref Rng & Units 04/18/2017 01/27/2017 12/26/2016  WBC 4.0 - 10.3 K/uL 5.4 6.3 5.5  Hemoglobin 13.0 - 17.1 g/dL 13.0 11.1(L) 10.9(L)  Hematocrit 38.4 - 49.9 % 40.6 36.7(L) 35.0(L)    Platelets 140 - 400 K/uL 151 177 196    CBC    Component Value Date/Time   WBC 5.4 04/18/2017 0939   RBC 4.39 04/18/2017 0939   RBC 4.39 04/18/2017 0939   HGB 13.0 04/18/2017 0939   HGB 10.9 (L) 12/26/2016 1400   HCT 40.6 04/18/2017 0939   HCT 35.0 (L) 12/26/2016 1400   PLT 151 04/18/2017 0939   PLT 196 12/26/2016 1400   MCV 92.5 04/18/2017 0939   MCV 91.1 12/26/2016 1400  MCH 29.6 04/18/2017 0939   MCHC 32.0 04/18/2017 0939   RDW 15.8 (H) 04/18/2017 0939   RDW 14.6 12/26/2016 1400   LYMPHSABS 1.3 04/18/2017 0939   LYMPHSABS 1.5 12/26/2016 1400   MONOABS 0.3 04/18/2017 0939   MONOABS 0.4 12/26/2016 1400   EOSABS 0.0 04/18/2017 0939   EOSABS 0.1 12/26/2016 1400   BASOSABS 0.0 04/18/2017 0939   BASOSABS 0.0 12/26/2016 1400    . CMP Latest Ref Rng & Units 04/18/2017 01/27/2017 12/26/2016  Glucose 70 - 140 mg/dL 188(H) 148(H) 151(H)  BUN 7 - 26 mg/dL 17 13 16.4  Creatinine 0.70 - 1.30 mg/dL 1.24 1.13 1.1  Sodium 136 - 145 mmol/L 141 141 139  Potassium 3.5 - 5.1 mmol/L 4.2 4.7 4.4  Chloride 98 - 109 mmol/L 109 111(H) -  CO2 22 - 29 mmol/L 22 22 20(L)  Calcium 8.4 - 10.4 mg/dL 9.1 8.8 8.6  Total Protein 6.4 - 8.3 g/dL 6.4 6.2(L) 6.2(L)  Total Bilirubin 0.2 - 1.2 mg/dL 0.5 0.5 0.32  Alkaline Phos 40 - 150 U/L 53 50 56  AST 5 - 34 U/L 20 18 22   ALT 0 - 55 U/L 28 23 25    . Lab Results  Component Value Date   IRON 41 (L) 12/28/2015   TIBC 334 12/28/2015   IRONPCTSAT 12 (L) 12/28/2015   (Iron and TIBC)  Lab Results  Component Value Date   FERRITIN 182 04/18/2017    Lab Results  Component Value Date   LDH 153 04/18/2017    RADIOGRAPHIC STUDIES: I have personally reviewed the radiological images as listed and agreed with the findings in the report.  Ct chest/abdpelvis12/2018: IMPRESSION: 1. Status post right hemicolectomy with circumferential wall thickening in the neo terminal ileum extending into the ileocolic anastomosis and having asymmetric wall  thickening in the proximal post anastomotic colon. There is pericolonic edema/inflammation tracking down the remaining segment of the colon to the rectum with interval increase in size of previously seen mesenteric lymph nodes adjacent to the anastomosis and interval development of multiple tiny perirectal nodules. These imaging features may be related to inflammatory bowel disease although infectious etiology neoplasm could have this appearance. 2. Apparent wall thickening in the rectum. 3. Potential pedunculated polyp in the neo terminal ileum. 4.  Aortic Atherosclerois (ICD10-170.0) 5. Multiple tiny pulmonary nodules in the lower lungs. Many of these were seen on the previous abdomen and pelvis CT and are stable in the interval. One particular 3 mm nodule in the left lower lobe was not included on the prior study and attention to this on follow-up imaging is recommended.   ASSESSMENT & PLAN:   1.Follicular NHL Patient has never required treatment for this.  no obvious progression by CT [facility] 11-2015 -CT abd 11/27/2016 findings were likely related to his E Coli colonic infection and has clinically resolved with levofloxacin rx from our clinic. . Lab Results  Component Value Date   LDH 153 04/18/2017   Plan -Patient has no clinical or lab evidence of lymphoma progression at this time. -No overtly palpable significantly enlarged peripheral lymphadenopathy. LDH level is within normal limits. No reported constitutional symptoms.  -will need to be monitored with interval imaging in 6-12 months  2. Iron deficiency anemia previously related to GI bleeding from nonmalignant inflammatory anastomotic colon polyp.  Iron deficiency persistent. S/p IV injectafer x 2 injan-feb 2019. hgb has improved from 11 to 13 Ferritin 13---> 182 Plan -continue maintenance Iron polysaccharide 150mg  daily -monitor for  ongoing gi losses  3.remote history of synchronous colon cancers.  Colonoscopy planned 12/08-2016 by Dr Amedeo Plenty, has been performed. We do not have records yet, however, the patient reported that Dr Amedeo Plenty felt positive about his results. We will follow up on this.   CT abd -- showed inflammation due to E.coli  CEA 2.16 on 01/27/2017   PLAN -he is still on dual antiplatelet therapy which increase his risk of chronic GI losses. -Discussed pt labwork today 04/18/17; blood counts are all WNL except for RDW at 15.8. Hgb improved to 13.0.  -We will continue to track Hgb and ferritin levels.  -In light of the patient's ankle swelling, we recommend that he follow up with his cardiologist or PCP and use sports compression socks.  -Recommend decreasing salt intake.   RTC with Dr Irene Limbo with labs in 4 months   . The total time spent in the appointment was 25 minutes and more than 50% was on counseling and direct patient cares with regards to screening for NHL, colon ca and mx of IDA.      Sullivan Lone MD [place] AAHIVMS [facility] Hematology/Oncology Physician [facility]  (Office):       [phone] (Work cell):  [phone] (Fax):           [phone]  This document serves as a record of services personally performed by Sullivan Lone, MD. It was created on his behalf by Baldwin Jamaica, a trained medical scribe. The creation of this record is based on the scribe's personal observations and the provider's statements to them.   .I have reviewed the above documentation for accuracy and completeness, and I agree with the above. Brunetta Genera MD MS

## 2017-04-18 ENCOUNTER — Inpatient Hospital Stay: Payer: PPO | Attending: Hematology

## 2017-04-18 ENCOUNTER — Encounter: Payer: Self-pay | Admitting: Hematology

## 2017-04-18 ENCOUNTER — Inpatient Hospital Stay (HOSPITAL_BASED_OUTPATIENT_CLINIC_OR_DEPARTMENT_OTHER): Payer: PPO | Admitting: Hematology

## 2017-04-18 ENCOUNTER — Telehealth: Payer: Self-pay

## 2017-04-18 VITALS — BP 168/65 | HR 63 | Temp 98.4°F | Resp 19 | Ht 66.0 in | Wt 250.9 lb

## 2017-04-18 DIAGNOSIS — I10 Essential (primary) hypertension: Secondary | ICD-10-CM | POA: Insufficient documentation

## 2017-04-18 DIAGNOSIS — E119 Type 2 diabetes mellitus without complications: Secondary | ICD-10-CM

## 2017-04-18 DIAGNOSIS — Z87891 Personal history of nicotine dependence: Secondary | ICD-10-CM

## 2017-04-18 DIAGNOSIS — C8203 Follicular lymphoma grade I, intra-abdominal lymph nodes: Secondary | ICD-10-CM

## 2017-04-18 DIAGNOSIS — C8290 Follicular lymphoma, unspecified, unspecified site: Secondary | ICD-10-CM

## 2017-04-18 DIAGNOSIS — Z85038 Personal history of other malignant neoplasm of large intestine: Secondary | ICD-10-CM | POA: Insufficient documentation

## 2017-04-18 DIAGNOSIS — D500 Iron deficiency anemia secondary to blood loss (chronic): Secondary | ICD-10-CM

## 2017-04-18 DIAGNOSIS — D509 Iron deficiency anemia, unspecified: Secondary | ICD-10-CM | POA: Diagnosis not present

## 2017-04-18 LAB — CBC WITH DIFFERENTIAL/PLATELET
BASOS ABS: 0 10*3/uL (ref 0.0–0.1)
BASOS PCT: 0 %
Eosinophils Absolute: 0 10*3/uL (ref 0.0–0.5)
Eosinophils Relative: 1 %
HCT: 40.6 % (ref 38.4–49.9)
HEMOGLOBIN: 13 g/dL (ref 13.0–17.1)
LYMPHS PCT: 25 %
Lymphs Abs: 1.3 10*3/uL (ref 0.9–3.3)
MCH: 29.6 pg (ref 27.2–33.4)
MCHC: 32 g/dL (ref 32.0–36.0)
MCV: 92.5 fL (ref 79.3–98.0)
MONOS PCT: 6 %
Monocytes Absolute: 0.3 10*3/uL (ref 0.1–0.9)
NEUTROS PCT: 68 %
Neutro Abs: 3.6 10*3/uL (ref 1.5–6.5)
Platelets: 151 10*3/uL (ref 140–400)
RBC: 4.39 MIL/uL (ref 4.20–5.82)
RDW: 15.8 % — ABNORMAL HIGH (ref 11.0–14.6)
WBC: 5.4 10*3/uL (ref 4.0–10.3)

## 2017-04-18 LAB — COMPREHENSIVE METABOLIC PANEL
ALT: 28 U/L (ref 0–55)
AST: 20 U/L (ref 5–34)
Albumin: 3.6 g/dL (ref 3.5–5.0)
Alkaline Phosphatase: 53 U/L (ref 40–150)
Anion gap: 10 (ref 3–11)
BILIRUBIN TOTAL: 0.5 mg/dL (ref 0.2–1.2)
BUN: 17 mg/dL (ref 7–26)
CALCIUM: 9.1 mg/dL (ref 8.4–10.4)
CHLORIDE: 109 mmol/L (ref 98–109)
CO2: 22 mmol/L (ref 22–29)
Creatinine, Ser: 1.24 mg/dL (ref 0.70–1.30)
GFR calc Af Amer: >60 mL/min (ref >60)
GFR, EST NON AFRICAN AMERICAN: 56 mL/min — AB (ref >60)
GLUCOSE: 188 mg/dL — AB (ref 70–140)
Potassium: 4.2 mmol/L (ref 3.5–5.1)
Sodium: 141 mmol/L (ref 136–145)
TOTAL PROTEIN: 6.4 g/dL (ref 6.4–8.3)

## 2017-04-18 LAB — RETICULOCYTES
RBC.: 4.39 MIL/uL (ref 4.20–5.82)
Retic Count, Absolute: 52.7 10*3/uL (ref 34.8–93.9)
Retic Ct Pct: 1.2 % (ref 0.8–1.8)

## 2017-04-18 LAB — LACTATE DEHYDROGENASE: LDH: 153 U/L (ref 125–245)

## 2017-04-18 LAB — FERRITIN: Ferritin: 182 ng/mL (ref 22–316)

## 2017-04-18 NOTE — Telephone Encounter (Signed)
Printed avs and calender of upcoming appointment. Per 4/12 los

## 2017-04-18 NOTE — Patient Instructions (Signed)
Thank you for choosing [facility] to provide your oncology and hematology care.  To afford each patient quality time with our providers, please arrive 30 minutes before your scheduled appointment time.  If you arrive late for your appointment, you may be asked to reschedule.  We strive to give you quality time with our providers, and arriving late affects you and other patients whose appointments are after yours.   If you are a no show for multiple scheduled visits, you may be dismissed from the clinic at the providers discretion.    Again, thank you for choosing [facility], our hope is that these requests will decrease the amount of time that you wait before being seen by our physicians.  ______________________________________________________________________  Should you have questions after your visit to the [facility], please contact our office at [phone] between the hours of 8:30 and 4:30 p.m.    Voicemails left after 4:30p.m will not be returned until the following business day.    For prescription refill requests, please have your pharmacy contact us directly.  Please also try to allow 48 hours for prescription requests.    Please contact the scheduling department for questions regarding scheduling.  For scheduling of procedures such as PET scans, CT scans, MRI, Ultrasound, etc please contact central scheduling at [phone].    Resources For Cancer Patients and Caregivers:   Oncolink.org:  A wonderful resource for patients and healthcare providers for information regarding your disease, ways to tract your treatment, what to expect, etc.     American Cancer Society:  800-227-2345  Can help patients locate various types of support and financial assistance  Cancer Care: 1-800-813-HOPE (4673) Provides financial assistance, online support groups, medication/co-pay assistance.    [place] DSS:  336-641-3447 Where to apply for food  stamps, Medicaid, and utility assistance  Medicare Rights Center: 800-333-4114 Helps people with Medicare understand their rights and benefits, navigate the Medicare system, and secure the quality healthcare they deserve  SCAT: 336-333-6589 [place] [employer]'s shared-ride transportation service for eligible riders who have a disability that prevents them from riding the fixed route bus.    For additional information on assistance programs please contact our social worker:   Grier Hock/Abigail Elmore:  [phone]            

## 2017-04-21 ENCOUNTER — Ambulatory Visit: Payer: PPO | Admitting: Hematology

## 2017-04-21 ENCOUNTER — Other Ambulatory Visit: Payer: PPO

## 2017-05-04 DIAGNOSIS — Z85038 Personal history of other malignant neoplasm of large intestine: Secondary | ICD-10-CM | POA: Diagnosis not present

## 2017-05-04 DIAGNOSIS — I10 Essential (primary) hypertension: Secondary | ICD-10-CM | POA: Diagnosis not present

## 2017-05-04 DIAGNOSIS — N400 Enlarged prostate without lower urinary tract symptoms: Secondary | ICD-10-CM | POA: Diagnosis not present

## 2017-05-04 DIAGNOSIS — I2510 Atherosclerotic heart disease of native coronary artery without angina pectoris: Secondary | ICD-10-CM | POA: Diagnosis not present

## 2017-05-04 DIAGNOSIS — H409 Unspecified glaucoma: Secondary | ICD-10-CM | POA: Diagnosis not present

## 2017-05-04 DIAGNOSIS — E1142 Type 2 diabetes mellitus with diabetic polyneuropathy: Secondary | ICD-10-CM | POA: Diagnosis not present

## 2017-05-04 DIAGNOSIS — I252 Old myocardial infarction: Secondary | ICD-10-CM | POA: Diagnosis not present

## 2017-05-04 DIAGNOSIS — M1990 Unspecified osteoarthritis, unspecified site: Secondary | ICD-10-CM | POA: Diagnosis not present

## 2017-05-04 DIAGNOSIS — Z7984 Long term (current) use of oral hypoglycemic drugs: Secondary | ICD-10-CM | POA: Diagnosis not present

## 2017-05-04 DIAGNOSIS — E782 Mixed hyperlipidemia: Secondary | ICD-10-CM | POA: Diagnosis not present

## 2017-05-19 DIAGNOSIS — C189 Malignant neoplasm of colon, unspecified: Secondary | ICD-10-CM | POA: Diagnosis not present

## 2017-05-19 DIAGNOSIS — I10 Essential (primary) hypertension: Secondary | ICD-10-CM | POA: Diagnosis not present

## 2017-05-19 DIAGNOSIS — K219 Gastro-esophageal reflux disease without esophagitis: Secondary | ICD-10-CM | POA: Diagnosis not present

## 2017-05-19 DIAGNOSIS — E1142 Type 2 diabetes mellitus with diabetic polyneuropathy: Secondary | ICD-10-CM | POA: Diagnosis not present

## 2017-05-19 DIAGNOSIS — I2510 Atherosclerotic heart disease of native coronary artery without angina pectoris: Secondary | ICD-10-CM | POA: Diagnosis not present

## 2017-05-19 DIAGNOSIS — I739 Peripheral vascular disease, unspecified: Secondary | ICD-10-CM | POA: Diagnosis not present

## 2017-05-19 DIAGNOSIS — I252 Old myocardial infarction: Secondary | ICD-10-CM | POA: Diagnosis not present

## 2017-05-19 DIAGNOSIS — E782 Mixed hyperlipidemia: Secondary | ICD-10-CM | POA: Diagnosis not present

## 2017-05-19 DIAGNOSIS — H40012 Open angle with borderline findings, low risk, left eye: Secondary | ICD-10-CM | POA: Diagnosis not present

## 2017-05-26 ENCOUNTER — Inpatient Hospital Stay: Payer: PPO | Attending: Hematology | Admitting: Hematology

## 2017-05-26 ENCOUNTER — Inpatient Hospital Stay: Payer: PPO

## 2017-05-26 DIAGNOSIS — Z85038 Personal history of other malignant neoplasm of large intestine: Secondary | ICD-10-CM | POA: Insufficient documentation

## 2017-05-26 DIAGNOSIS — C8290 Follicular lymphoma, unspecified, unspecified site: Secondary | ICD-10-CM | POA: Diagnosis not present

## 2017-05-26 DIAGNOSIS — C8203 Follicular lymphoma grade I, intra-abdominal lymph nodes: Secondary | ICD-10-CM

## 2017-05-26 LAB — CBC WITH DIFFERENTIAL (CANCER CENTER ONLY)
BASOS ABS: 0 10*3/uL (ref 0.0–0.1)
BASOS PCT: 0 %
EOS PCT: 1 %
Eosinophils Absolute: 0 10*3/uL (ref 0.0–0.5)
HEMATOCRIT: 39.7 % (ref 38.4–49.9)
Hemoglobin: 12.6 g/dL — ABNORMAL LOW (ref 13.0–17.1)
Lymphocytes Relative: 21 %
Lymphs Abs: 1.3 10*3/uL (ref 0.9–3.3)
MCH: 30.2 pg (ref 27.2–33.4)
MCHC: 31.7 g/dL — ABNORMAL LOW (ref 32.0–36.0)
MCV: 95.2 fL (ref 79.3–98.0)
Monocytes Absolute: 0.4 10*3/uL (ref 0.1–0.9)
Monocytes Relative: 7 %
NEUTROS ABS: 4.2 10*3/uL (ref 1.5–6.5)
Neutrophils Relative %: 71 %
PLATELETS: 150 10*3/uL (ref 140–400)
RBC: 4.17 MIL/uL — AB (ref 4.20–5.82)
RDW: 14.4 % (ref 11.0–14.6)
WBC Count: 6 10*3/uL (ref 4.0–10.3)

## 2017-05-26 LAB — RETICULOCYTES
RBC.: 4.17 MIL/uL — AB (ref 4.20–5.82)
RETIC COUNT ABSOLUTE: 66.7 10*3/uL (ref 34.8–93.9)
Retic Ct Pct: 1.6 % (ref 0.8–1.8)

## 2017-05-26 LAB — CEA (IN HOUSE-CHCC): CEA (CHCC-IN HOUSE): 2.43 ng/mL (ref 0.00–5.00)

## 2017-05-27 ENCOUNTER — Telehealth: Payer: Self-pay

## 2017-05-27 NOTE — Telephone Encounter (Signed)
Per 5/20 no los 

## 2017-06-03 DIAGNOSIS — H353122 Nonexudative age-related macular degeneration, left eye, intermediate dry stage: Secondary | ICD-10-CM | POA: Diagnosis not present

## 2017-06-03 DIAGNOSIS — E113292 Type 2 diabetes mellitus with mild nonproliferative diabetic retinopathy without macular edema, left eye: Secondary | ICD-10-CM | POA: Diagnosis not present

## 2017-06-03 DIAGNOSIS — H211X1 Other vascular disorders of iris and ciliary body, right eye: Secondary | ICD-10-CM | POA: Diagnosis not present

## 2017-06-03 DIAGNOSIS — H3581 Retinal edema: Secondary | ICD-10-CM | POA: Diagnosis not present

## 2017-06-03 NOTE — Progress Notes (Signed)
This encounter was created in error - please disregard.

## 2017-08-14 ENCOUNTER — Telehealth: Payer: Self-pay | Admitting: Hematology

## 2017-08-14 NOTE — Telephone Encounter (Signed)
Patient left voice mail regarding appt.  Confirmed appt with patient for lab/GK, 8/9 @ 9:30 for lab/GK following.

## 2017-08-14 NOTE — Progress Notes (Signed)
Marland Kitchen  HEMATOLOGY ONCOLOGY PROGRESS NOTE  Date of service: 08/15/17    Patient Care Team: Wenda Low, MD as PCP - General (Internal Medicine) Belva Crome, MD as PCP - Cardiology (Cardiology)  CC:  f/u for  history of colon cancer  low grade follicular NHL   INTERVAL HISTORY:  Mr Addis Tuohy" Santillanes is here for follow-up for his history of colon cancer and low-grade follicular non-Hodgkin's lymphoma which has not required treatment. He has also had history of previous iron deficiency anemia thought to be from colonic polyps. The patient's last visit with [place] was on 04/18/17. The pt reports that he is doing well overall.   The pt reports that he has not had any new concerns in the interim. He notes that he has stayed busy and has had stable energy levels. His bowel movement have been normal and regular, and he denies any abdominal pains.   Lab results today (08/15/17) of CMP is as follows: all values are WNL except for CO2 at 20, Glucose at 231, Calcium at 8.4, Total Protein at 6.4, GFR at 58. CEA 08/15/17 is stable at 2.43 LDH 08/15/17 is wnl at 138 Ferritin 08/15/17 is 182  On review of systems, pt reports stable energy levels, and denies abdominal pains, diarrhea, fevers, chills, night sweats, unexpected weight loss, changes in bowel habits, noticing any new lumps or bumps, skin rashes, blood in the stools, and any other symptoms.    REVIEW OF SYSTEMS:    A 10+ POINT REVIEW OF SYSTEMS WAS OBTAINED including neurology, dermatology, psychiatry, cardiac, respiratory, lymph, extremities, GI, GU, Musculoskeletal, constitutional, breasts, reproductive, HEENT.  All pertinent positives are noted in the HPI.  All others are negative.   . Past Medical History:  Diagnosis Date  . Anemia, iron deficiency 01/26/2011  . Arthritis   . Asthma   . Cancer (HCC)    nhl, colon ca  . Carotid artery occlusion    right occlusion of carotid with moderate left plaque  . Coronary artery disease August  2005   MI with stent  . Diabetes mellitus   . Hyperlipidemia   . Hypertension   . Kidney stones 05/2010   lithotripsy  . Myocardial infarction ([place])     . Past Surgical History:  Procedure Laterality Date  . CARPAL TUNNEL RELEASE Left 05/08/2015   Procedure: CARPAL TUNNEL RELEASE;  Surgeon: Charlotte Crumb, MD;  Location: [place];  Service: Orthopedics;  Laterality: Left;  . COLON SURGERY    . COLONOSCOPY    . CORONARY STENT PLACEMENT    . HERNIA REPAIR    . OPEN REDUCTION INTERNAL FIXATION (ORIF) DISTAL RADIAL FRACTURE Left 05/08/2015   Procedure: OPEN REDUCTION INTERNAL FIXATION (ORIF) DISTAL RADIAL FRACTURE, CARPAL TUNNEL RELEASE;  Surgeon: Charlotte Crumb, MD;  Location: [place];  Service: Orthopedics;  Laterality: Left;  . REPAIR OF COMPLEX TRACTION RETINAL DETACHMENT      . Social History   Tobacco Use  . Smoking status: Former Smoker    Packs/day: 1.00    Years: 20.00    Pack years: 20.00    Last attempt to quit: 01/08/1980    Years since quitting: 37.6  . Smokeless tobacco: Never Used  Substance Use Topics  . Alcohol use: No    Alcohol/week: 0.0 standard drinks  . Drug use: No    ALLERGIES:  is allergic to iodine.  MEDICATIONS:  Current Outpatient Medications  Medication Sig Dispense Refill  . aspirin 81 MG tablet Take 81 mg by  mouth daily.      Marland Kitchen atorvastatin (LIPITOR) 40 MG tablet Take 40 mg by mouth daily.      . clopidogrel (PLAVIX) 75 MG tablet Take 75 mg by mouth daily.      . Coenzyme Q10 (CO Q 10 PO) Take 1 capsule by mouth daily.     . dorzolamide-timolol (COSOPT) 22.3-6.8 MG/ML ophthalmic solution Place 1 drop into both eyes 2 (two) times daily.    . fentaNYL (SUBLIMAZE) 100 MCG/2ML injection Inject 200 mcg into the vein once.    Marland Kitchen glucosamine-chondroitin 500-400 MG tablet Take 1 tablet by mouth daily.     . iron polysaccharides (NIFEREX) 150 MG capsule Take 1 capsule (150 mg total) by mouth daily. 30 capsule 1  . levofloxacin (LEVAQUIN) 500 MG tablet  Take 1 tablet (500 mg total) by mouth daily. 7 tablet 0  . lisinopril (PRINIVIL,ZESTRIL) 5 MG tablet Take 5 mg by mouth daily.     Marland Kitchen losartan (COZAAR) 50 MG tablet Take 50 mg by mouth daily.  3  . Lutein 10 MG TABS Take 1 tablet by mouth daily.     Marland Kitchen LYCOPENE PO Take 1 tablet by mouth daily.     . metFORMIN (GLUCOPHAGE) 500 MG tablet Take 1,000 mg by mouth 2 (two) times daily with a meal.    . metoprolol (LOPRESSOR) 50 MG tablet Take 25 mg by mouth 2 (two) times daily.    . nitroGLYCERIN (NITROSTAT) 0.4 MG SL tablet Place 1 tablet (0.4 mg total) under the tongue every 5 (five) minutes as needed for chest pain. 25 tablet 3  . Omega-3 Fatty Acids (FISH OIL) 1000 MG CAPS Take 1 capsule by mouth daily.    Marland Kitchen oxyCODONE-acetaminophen (ROXICET) 5-325 MG tablet Take 1 tablet by mouth every 4 (four) hours as needed for severe pain. 30 tablet 0  . pantoprazole (PROTONIX) 40 MG tablet Take 40 mg by mouth daily.     . sitaGLIPtin (JANUVIA) 100 MG tablet Take 100 mg by mouth daily.       No current facility-administered medications for this visit.     PHYSICAL EXAMINATION: ECOG PERFORMANCE STATUS: 2 - Symptomatic, <50% confined to bed  Vitals:   08/15/17 0955  BP: (!) 139/59  Pulse: 66  Resp: 17  Temp: 97.7 F (36.5 C)  SpO2: 99%    Filed Weights   08/15/17 0955  Weight: 252 lb 8 oz (114.5 kg)   .Body mass index is 40.75 kg/m.  GENERAL:alert, in no acute distress and comfortable SKIN: no acute rashes, no significant lesions EYES: conjunctiva are pink and non-injected, sclera anicteric OROPHARYNX: MMM, no exudates, no oropharyngeal erythema or ulceration NECK: supple, no JVD LYMPH:  no palpable lymphadenopathy in the cervical, axillary or inguinal regions LUNGS: clear to auscultation b/l with normal respiratory effort HEART: regular rate & rhythm ABDOMEN:  normoactive bowel sounds , non tender, not distended. No palpable hepatosplenomegaly.  Extremity: no pedal edema PSYCH: alert &  oriented x 3 with fluent speech NEURO: no focal motor/sensory deficits   LABORATORY DATA:   I have reviewed the data as listed  . CBC Latest Ref Rng & Units 08/15/2017 05/26/2017 04/18/2017  WBC 4.0 - 10.3 K/uL 5.2 6.0 5.4  Hemoglobin 13.0 - 17.1 g/dL 13.2 12.6(L) 13.0  Hematocrit 38.4 - 49.9 % 39.9 39.7 40.6  Platelets 140 - 400 K/uL 137(L) 150 151    CBC    Component Value Date/Time   WBC 5.2 08/15/2017 0947   WBC 5.4  04/18/2017 0939   RBC 4.27 08/15/2017 0947   HGB 13.2 08/15/2017 0947   HGB 10.9 (L) 12/26/2016 1400   HCT 39.9 08/15/2017 0947   HCT 35.0 (L) 12/26/2016 1400   PLT 137 (L) 08/15/2017 0947   PLT 196 12/26/2016 1400   MCV 93.5 08/15/2017 0947   MCV 91.1 12/26/2016 1400   MCH 30.9 08/15/2017 0947   MCHC 33.0 08/15/2017 0947   RDW 13.9 08/15/2017 0947   RDW 14.6 12/26/2016 1400   LYMPHSABS 1.0 08/15/2017 0947   LYMPHSABS 1.5 12/26/2016 1400   MONOABS 0.4 08/15/2017 0947   MONOABS 0.4 12/26/2016 1400   EOSABS 0.0 08/15/2017 0947   EOSABS 0.1 12/26/2016 1400   BASOSABS 0.1 08/15/2017 0947   BASOSABS 0.0 12/26/2016 1400    . CMP Latest Ref Rng & Units 08/15/2017 04/18/2017 01/27/2017  Glucose 70 - 99 mg/dL 231(H) 188(H) 148(H)  BUN 8 - 23 mg/dL 18 17 13   Creatinine 0.61 - 1.24 mg/dL 1.20 1.24 1.13  Sodium 135 - 145 mmol/L 139 141 141  Potassium 3.5 - 5.1 mmol/L 4.4 4.2 4.7  Chloride 98 - 111 mmol/L 109 109 111(H)  CO2 22 - 32 mmol/L 20(L) 22 22  Calcium 8.9 - 10.3 mg/dL 8.4(L) 9.1 8.8  Total Protein 6.5 - 8.1 g/dL 6.4(L) 6.4 6.2(L)  Total Bilirubin 0.3 - 1.2 mg/dL 0.6 0.5 0.5  Alkaline Phos 38 - 126 U/L 55 53 50  AST 15 - 41 U/L 18 20 18   ALT 0 - 44 U/L 29 28 23    . Lab Results  Component Value Date   IRON 41 (L) 12/28/2015   TIBC 334 12/28/2015   IRONPCTSAT 12 (L) 12/28/2015   (Iron and TIBC)  Lab Results  Component Value Date   FERRITIN 182 08/15/2017    Lab Results  Component Value Date   LDH 138 08/15/2017    RADIOGRAPHIC STUDIES: I  have personally reviewed the radiological images as listed and agreed with the findings in the report.  Ct chest/abdpelvis12/2018: IMPRESSION: 1. Status post right hemicolectomy with circumferential wall thickening in the neo terminal ileum extending into the ileocolic anastomosis and having asymmetric wall thickening in the proximal post anastomotic colon. There is pericolonic edema/inflammation tracking down the remaining segment of the colon to the rectum with interval increase in size of previously seen mesenteric lymph nodes adjacent to the anastomosis and interval development of multiple tiny perirectal nodules. These imaging features may be related to inflammatory bowel disease although infectious etiology neoplasm could have this appearance. 2. Apparent wall thickening in the rectum. 3. Potential pedunculated polyp in the neo terminal ileum. 4.  Aortic Atherosclerois (ICD10-170.0) 5. Multiple tiny pulmonary nodules in the lower lungs. Many of these were seen on the previous abdomen and pelvis CT and are stable in the interval. One particular 3 mm nodule in the left lower lobe was not included on the prior study and attention to this on follow-up imaging is recommended.   ASSESSMENT & PLAN:   1.Follicular NHL Patient has never required treatment for this.  no obvious progression by CT [facility] 11-2015 -CT abd 11/27/2016 findings were likely related to his E Coli colonic infection and has clinically resolved with levofloxacin rx from our clinic. . Lab Results  Component Value Date   LDH 138 08/15/2017   Plan -Patient has no clinical or lab evidence of lymphoma progression at this time. -No overtly palpable significantly enlarged peripheral lymphadenopathy. LDH level is within normal limits. No reported  constitutional symptoms.  -will need to be monitored with interval imaging in 6-12 months  2. Iron deficiency anemia previously related to GI bleeding from nonmalignant  inflammatory anastomotic colon polyp.  Iron deficiency persistent. S/p IV injectafer x 2 injan-feb 2019. hgb has improved from 11 to 13 Ferritin 13---> 182 Plan -continue maintenance Iron polysaccharide 150mg  daily -monitor for ongoing gi losses  3.remote history of synchronous colon cancers. Colonoscopy planned 12/08-2016 by Dr Amedeo Plenty, has been performed. We do not have records yet, however, the patient reported that Dr Amedeo Plenty felt positive about his results. We will follow up on this.   CT abd -- showed inflammation due to E.coli  CEA 2.3 on 8//2019   PLAN  -Discussed pt labwork today, 08/15/17; labs stable -CBC, CEA, LDH, Ferritin reviewed -- unremarkable -Pt's last colonoscopy was December 2018 and will repeat in 2 years -The pt shows no clinical or lab progression of colon or follicular cancers at this time.  -No indication for active treatment of FL or colon cancer at this time.   CT chest/abd/pelvis in 24 weeks RTC with labs with Dr Irene Limbo in 6 months   -he is still on dual antiplatelet therapy which increase his risk of chronic GI losses. -We will continue to track Hgb and ferritin levels.  -In light of the patient's ankle swelling, we recommend that he follow up with his cardiologist or PCP and use sports compression socks.  -Recommend decreasing salt intake.  CT chest/abd/pelvis in 24 weeks RTC with labs with Dr Irene Limbo in 6 months    The total time spent in the appt was 25 minutes and more than 50% was on counseling and direct patient cares.      Sullivan Lone MD MS AAHIVMS [facility] Hematology/Oncology Physician [facility]  (Office):       [phone] (Work cell):  [phone] (Fax):           [phone]  I, Baldwin Jamaica, am acting as a scribe for Dr. Irene Limbo  .I have reviewed the above documentation for accuracy and completeness, and I agree with the above. Brunetta Genera MD

## 2017-08-14 NOTE — Telephone Encounter (Signed)
Called patient regarding 8/9 he said he was not aware of this appt. I advised him to stop by scheduling after each appt to make sure he gets his next time and date

## 2017-08-15 ENCOUNTER — Other Ambulatory Visit: Payer: Self-pay

## 2017-08-15 ENCOUNTER — Inpatient Hospital Stay: Payer: PPO | Attending: Hematology

## 2017-08-15 ENCOUNTER — Inpatient Hospital Stay (HOSPITAL_BASED_OUTPATIENT_CLINIC_OR_DEPARTMENT_OTHER): Payer: PPO | Admitting: Hematology

## 2017-08-15 ENCOUNTER — Telehealth: Payer: Self-pay | Admitting: Hematology

## 2017-08-15 VITALS — BP 139/59 | HR 66 | Temp 97.7°F | Resp 17 | Ht 66.0 in | Wt 252.5 lb

## 2017-08-15 DIAGNOSIS — I10 Essential (primary) hypertension: Secondary | ICD-10-CM | POA: Insufficient documentation

## 2017-08-15 DIAGNOSIS — D509 Iron deficiency anemia, unspecified: Secondary | ICD-10-CM

## 2017-08-15 DIAGNOSIS — Z87891 Personal history of nicotine dependence: Secondary | ICD-10-CM | POA: Insufficient documentation

## 2017-08-15 DIAGNOSIS — E119 Type 2 diabetes mellitus without complications: Secondary | ICD-10-CM | POA: Insufficient documentation

## 2017-08-15 DIAGNOSIS — C8290 Follicular lymphoma, unspecified, unspecified site: Secondary | ICD-10-CM | POA: Diagnosis not present

## 2017-08-15 DIAGNOSIS — Z85038 Personal history of other malignant neoplasm of large intestine: Secondary | ICD-10-CM

## 2017-08-15 LAB — CBC WITH DIFFERENTIAL (CANCER CENTER ONLY)
Basophils Absolute: 0.1 10*3/uL (ref 0.0–0.1)
Basophils Relative: 1 %
EOS PCT: 1 %
Eosinophils Absolute: 0 10*3/uL (ref 0.0–0.5)
HCT: 39.9 % (ref 38.4–49.9)
Hemoglobin: 13.2 g/dL (ref 13.0–17.1)
LYMPHS ABS: 1 10*3/uL (ref 0.9–3.3)
Lymphocytes Relative: 20 %
MCH: 30.9 pg (ref 27.2–33.4)
MCHC: 33 g/dL (ref 32.0–36.0)
MCV: 93.5 fL (ref 79.3–98.0)
MONO ABS: 0.4 10*3/uL (ref 0.1–0.9)
MONOS PCT: 7 %
Neutro Abs: 3.7 10*3/uL (ref 1.5–6.5)
Neutrophils Relative %: 71 %
PLATELETS: 137 10*3/uL — AB (ref 140–400)
RBC: 4.27 MIL/uL (ref 4.20–5.82)
RDW: 13.9 % (ref 11.0–14.6)
WBC: 5.2 10*3/uL (ref 4.0–10.3)

## 2017-08-15 LAB — CMP (CANCER CENTER ONLY)
ALBUMIN: 3.6 g/dL (ref 3.5–5.0)
ALT: 29 U/L (ref 0–44)
AST: 18 U/L (ref 15–41)
Alkaline Phosphatase: 55 U/L (ref 38–126)
Anion gap: 10 (ref 5–15)
BUN: 18 mg/dL (ref 8–23)
CHLORIDE: 109 mmol/L (ref 98–111)
CO2: 20 mmol/L — AB (ref 22–32)
Calcium: 8.4 mg/dL — ABNORMAL LOW (ref 8.9–10.3)
Creatinine: 1.2 mg/dL (ref 0.61–1.24)
GFR, Est AFR Am: >60 mL/min (ref >60)
GFR, Estimated: 58 mL/min — ABNORMAL LOW (ref >60)
GLUCOSE: 231 mg/dL — AB (ref 70–99)
Potassium: 4.4 mmol/L (ref 3.5–5.1)
SODIUM: 139 mmol/L (ref 135–145)
Total Bilirubin: 0.6 mg/dL (ref 0.3–1.2)
Total Protein: 6.4 g/dL — ABNORMAL LOW (ref 6.5–8.1)

## 2017-08-15 LAB — FERRITIN: FERRITIN: 182 ng/mL (ref 24–336)

## 2017-08-15 LAB — LACTATE DEHYDROGENASE: LDH: 138 U/L (ref 98–192)

## 2017-08-15 LAB — CEA (IN HOUSE-CHCC): CEA (CHCC-In House): 2.86 ng/mL (ref 0.00–5.00)

## 2017-08-15 NOTE — Telephone Encounter (Signed)
Appt scheduled AVS/Calendar printed/contrast material provided per 8/9 los

## 2017-08-26 DIAGNOSIS — H3581 Retinal edema: Secondary | ICD-10-CM | POA: Diagnosis not present

## 2017-08-26 DIAGNOSIS — H353122 Nonexudative age-related macular degeneration, left eye, intermediate dry stage: Secondary | ICD-10-CM | POA: Diagnosis not present

## 2017-08-26 DIAGNOSIS — H211X1 Other vascular disorders of iris and ciliary body, right eye: Secondary | ICD-10-CM | POA: Diagnosis not present

## 2017-08-26 DIAGNOSIS — H2512 Age-related nuclear cataract, left eye: Secondary | ICD-10-CM | POA: Diagnosis not present

## 2017-08-29 DIAGNOSIS — H2512 Age-related nuclear cataract, left eye: Secondary | ICD-10-CM | POA: Diagnosis not present

## 2017-08-29 DIAGNOSIS — H35372 Puckering of macula, left eye: Secondary | ICD-10-CM | POA: Diagnosis not present

## 2017-09-10 DIAGNOSIS — H25812 Combined forms of age-related cataract, left eye: Secondary | ICD-10-CM | POA: Diagnosis not present

## 2017-09-10 DIAGNOSIS — H2512 Age-related nuclear cataract, left eye: Secondary | ICD-10-CM | POA: Diagnosis not present

## 2017-09-22 NOTE — Progress Notes (Signed)
Cardiology Office Note:    Date:  09/23/2017   ID:  [name], DOB [birth year], MRN [id number]  PCP:  Wenda Low, MD  Cardiologist:  Sinclair Grooms, MD   Referring MD: Wenda Low, MD   Chief Complaint  Patient presents with  . Coronary Artery Disease    History of Present Illness:    Bernon Arviso. is a 77 y.o. male with a hx of CAD with stent 2005 (location unknown and no records available), hypertension, hyperlipidemia, right carotid occlusion, diastolic heart failure, and diabetes mellitus.   He is very sedentary.  He has not had difficulty with chest discomfort or dyspnea.  He denies orthopnea.  He did have a stent placed in 2005.  He has known occlusive right carotid disease that is 100% obstructed and moderate left carotid disease.  He denies palpitations.  He has not had syncope orthopnea.  Rare lower extremity edema.  Noted to be on both lisinopril and losartan.    Past Medical History:  Diagnosis Date  . Anemia, iron deficiency 01/26/2011  . Arthritis   . Asthma   . Cancer (HCC)    nhl, colon ca  . Carotid artery occlusion    right occlusion of carotid with moderate left plaque  . Coronary artery disease August 2005   MI with stent  . Diabetes mellitus   . Hyperlipidemia   . Hypertension   . Kidney stones 05/2010   lithotripsy  . Myocardial infarction [facility])     Past Surgical History:  Procedure Laterality Date  . CARPAL TUNNEL RELEASE Left 05/08/2015   Procedure: CARPAL TUNNEL RELEASE;  Surgeon: Charlotte Crumb, MD;  Location: [place];  Service: Orthopedics;  Laterality: Left;  . COLON SURGERY    . COLONOSCOPY    . CORONARY STENT PLACEMENT    . HERNIA REPAIR    . OPEN REDUCTION INTERNAL FIXATION (ORIF) DISTAL RADIAL FRACTURE Left 05/08/2015   Procedure: OPEN REDUCTION INTERNAL FIXATION (ORIF) DISTAL RADIAL FRACTURE, CARPAL TUNNEL RELEASE;  Surgeon: Charlotte Crumb, MD;  Location: [place];  Service: Orthopedics;  Laterality: Left;    . REPAIR OF COMPLEX TRACTION RETINAL DETACHMENT      Current Medications: Current Meds  Medication Sig  . aspirin 81 MG tablet Take 81 mg by mouth daily.    Marland Kitchen atorvastatin (LIPITOR) 40 MG tablet Take 40 mg by mouth daily.    . clopidogrel (PLAVIX) 75 MG tablet Take 75 mg by mouth daily.    . Coenzyme Q10 (CO Q 10 PO) Take 1 capsule by mouth daily.   . dorzolamide-timolol (COSOPT) 22.3-6.8 MG/ML ophthalmic solution Place 1 drop into both eyes 2 (two) times daily.  Marland Kitchen glucosamine-chondroitin 500-400 MG tablet Take 1 tablet by mouth daily.   Marland Kitchen losartan (COZAAR) 50 MG tablet Take 50 mg by mouth daily.  . Lutein 10 MG TABS Take 1 tablet by mouth daily.   Marland Kitchen LYCOPENE PO Take 1 tablet by mouth daily.   . metFORMIN (GLUCOPHAGE) 500 MG tablet Take 1,000 mg by mouth 2 (two) times daily with a meal.  . metoprolol (LOPRESSOR) 50 MG tablet Take 25 mg by mouth 2 (two) times daily.  . nitroGLYCERIN (NITROSTAT) 0.4 MG SL tablet Place 0.4 mg under the tongue every 5 (five) minutes x 3 doses as needed for chest pain.  . Omega-3 Fatty Acids (FISH OIL) 1000 MG CAPS Take 1 capsule by mouth daily.  . pantoprazole (PROTONIX) 40 MG tablet Take 40 mg by mouth  daily.   . sitaGLIPtin (JANUVIA) 100 MG tablet Take 100 mg by mouth daily.    . [DISCONTINUED] lisinopril (PRINIVIL,ZESTRIL) 5 MG tablet Take 5 mg by mouth daily.      Allergies:   Iodine   Social History   Socioeconomic History  . Marital status: Married    Spouse name: Not on file  . Number of children: Not on file  . Years of education: Not on file  . Highest education level: Not on file  Occupational History  . Not on file  Social Needs  . Financial resource strain: Not on file  . Food insecurity:    Worry: Not on file    Inability: Not on file  . Transportation needs:    Medical: Not on file    Non-medical: Not on file  Tobacco Use  . Smoking status: Former Smoker    Packs/day: 1.00    Years: 20.00    Pack years: 20.00    Last  attempt to quit: 01/08/1980    Years since quitting: 37.7  . Smokeless tobacco: Never Used  Substance and Sexual Activity  . Alcohol use: No    Alcohol/week: 0.0 standard drinks  . Drug use: No  . Sexual activity: Not on file  Lifestyle  . Physical activity:    Days per week: Not on file    Minutes per session: Not on file  . Stress: Not on file  Relationships  . Social connections:    Talks on phone: Not on file    Gets together: Not on file    Attends religious service: Not on file    Active member of club or organization: Not on file    Attends meetings of clubs or organizations: Not on file    Relationship status: Not on file  Other Topics Concern  . Not on file  Social History Narrative  . Not on file     Family History: The patient's family history includes Cancer in his brother, father, maternal aunt, and mother; Healthy in his sister.  ROS:   Please see the history of present illness.    Cough, vision disturbance, leg swelling.  Dyspnea on exertion but he feels related to deconditioning.  Nasal drainage and chronic cough.  All other systems reviewed and are negative.  EKGs/Labs/Other Studies Reviewed:    The following studies were reviewed today: No  EKG:  EKG is not ordered today.  The ekg performed on 12/12/2016 demonstrates nonspecific ST abnormality, left axis, and poor R wave progression.  Recent Labs: 08/15/2017: ALT 29; BUN 18; Creatinine 1.20; Hemoglobin 13.2; Platelet Count 137; Potassium 4.4; Sodium 139  Recent Lipid Panel    Component Value Date/Time   CHOL 139 05/08/2005 1216   TRIG 97 05/08/2005 1216   HDL 31 (L) 05/08/2005 1216   CHOLHDL 4.5 05/08/2005 1216   VLDL 19 05/08/2005 1216   LDLCALC 89 05/08/2005 1216    Physical Exam:    VS:  BP (!) 162/76   Pulse 66   Ht 5\' 9"  (1.753 m)   Wt 252 lb 1.9 oz (114.4 kg)   BMI 37.23 kg/m     Wt Readings from Last 3 Encounters:  09/23/17 252 lb 1.9 oz (114.4 kg)  08/15/17 252 lb 8 oz (114.5 kg)    05/26/17 250 lb 14.4 oz (113.8 kg)     GEN:  Well nourished, well developed in no acute distress HEENT: Normal NECK: No JVD. LYMPHATICS: No lymphadenopathy CARDIAC: RRR, no murmur,  no gallop, trace bilateral ankle edema. VASCULAR: no pulses. no bruits. RESPIRATORY:  Clear to auscultation without rales, wheezing or rhonchi  ABDOMEN: Soft, non-tender, non-distended, No pulsatile mass, MUSCULOSKELETAL: No deformity  SKIN: Warm and dry NEUROLOGIC:  Alert and oriented x 3 PSYCHIATRIC:  Normal affect   ASSESSMENT:    1. Coronary artery disease with hx of myocardial infarct w/o hx of CABG   2. Essential hypertension, benign   3. Bilateral carotid artery stenosis   4. Mixed hyperlipidemia   5. ACE-inhibitor cough    PLAN:    In order of problems listed above:  1. Stable without angina.  Largely sedentary and therefore difficult to determine if he has provokable angina.  Lexiscan Myoview will be done to exclude high risk subset. 2. Blood pressures are running between 128-138/60 mmHg on recheck.  Will discontinue lisinopril.  Follow-up in blood pressure clinic where we will consider adding low-dose diuretic therapy if needed to help with blood pressure control. 3. Blood pressure and cholesterol control to 140/80 mmHg and LDL less than 70 respectively. 4. LDL target less than 70.  Currently on high intensity statin therapy and followed by Dr. Deforest Hoyles. 5. Discontinue lisinopril.  Blood pressure follow-up in BP clinic with further action if not at target blood pressure 140/80 mmHg.  Known total occlusion of the right carotid therefore more permissive blood pressure elevation than usual.  Clinical follow-up 1 year.  Earlier if high risk nuclear study.  Again blood pressure target should be somewhat clinically liberalize because we know he has total occlusion of the right internal carotid.  Clinical target of 140/80 mmHg is chosen empirically.   Medication Adjustments/Labs and Tests  Ordered: Current medicines are reviewed at length with the patient today.  Concerns regarding medicines are outlined above.  Orders Placed This Encounter  Procedures  . MYOCARDIAL PERFUSION IMAGING   No orders of the defined types were placed in this encounter.   Patient Instructions  Medication Instructions:  1) DISCONTINUE Lisinopril  Labwork: None  Testing/Procedures: Your physician has requested that you have a lexiscan myoview. For further information please visit [email or web address]. Please follow instruction sheet, as given.   Follow-Up: Your physician wants you to follow-up in: 1 year with Dr. Tamala Julian.  You will receive a reminder letter in the mail two months in advance. If you don't receive a letter, please call our office to schedule the follow-up appointment.   Any Other Special Instructions Will Be Listed Below (If Applicable).     If you need a refill on your cardiac medications before your next appointment, please call your pharmacy.      Signed, Sinclair Grooms, MD  09/23/2017 2:13 PM    [facility]

## 2017-09-23 ENCOUNTER — Ambulatory Visit: Payer: PPO | Admitting: Interventional Cardiology

## 2017-09-23 ENCOUNTER — Encounter (INDEPENDENT_AMBULATORY_CARE_PROVIDER_SITE_OTHER): Payer: Self-pay

## 2017-09-23 ENCOUNTER — Encounter: Payer: Self-pay | Admitting: *Deleted

## 2017-09-23 ENCOUNTER — Encounter: Payer: Self-pay | Admitting: Interventional Cardiology

## 2017-09-23 VITALS — BP 162/76 | HR 66 | Ht 69.0 in | Wt 252.1 lb

## 2017-09-23 DIAGNOSIS — R05 Cough: Secondary | ICD-10-CM

## 2017-09-23 DIAGNOSIS — I10 Essential (primary) hypertension: Secondary | ICD-10-CM

## 2017-09-23 DIAGNOSIS — T464X5A Adverse effect of angiotensin-converting-enzyme inhibitors, initial encounter: Secondary | ICD-10-CM | POA: Diagnosis not present

## 2017-09-23 DIAGNOSIS — E782 Mixed hyperlipidemia: Secondary | ICD-10-CM

## 2017-09-23 DIAGNOSIS — I2510 Atherosclerotic heart disease of native coronary artery without angina pectoris: Secondary | ICD-10-CM

## 2017-09-23 DIAGNOSIS — I252 Old myocardial infarction: Secondary | ICD-10-CM

## 2017-09-23 DIAGNOSIS — I6523 Occlusion and stenosis of bilateral carotid arteries: Secondary | ICD-10-CM

## 2017-09-23 NOTE — Patient Instructions (Signed)
Medication Instructions:  1) DISCONTINUE Lisinopril  Labwork: None  Testing/Procedures: Your physician has requested that you have a lexiscan myoview. For further information please visit [email or web address]. Please follow instruction sheet, as given.   Follow-Up: Your physician wants you to follow-up in: 1 year with Dr. Tamala Julian.  You will receive a reminder letter in the mail two months in advance. If you don't receive a letter, please call our office to schedule the follow-up appointment.   Any Other Special Instructions Will Be Listed Below (If Applicable).     If you need a refill on your cardiac medications before your next appointment, please call your pharmacy.

## 2017-09-25 ENCOUNTER — Telehealth (HOSPITAL_COMMUNITY): Payer: Self-pay | Admitting: *Deleted

## 2017-09-25 NOTE — Telephone Encounter (Signed)
Patient given detailed instructions per Myocardial Perfusion Study Information Sheet for the test on 10/02/17. Patient notified to arrive 15 minutes early and that it is imperative to arrive on time for appointment to keep from having the test rescheduled.  If you need to cancel or reschedule your appointment, please call the office within 24 hours of your appointment. . Patient verbalized understanding. Mark [place] Jacqueline     

## 2017-10-02 ENCOUNTER — Ambulatory Visit (HOSPITAL_COMMUNITY): Payer: PPO | Attending: Cardiovascular Disease

## 2017-10-02 ENCOUNTER — Encounter (INDEPENDENT_AMBULATORY_CARE_PROVIDER_SITE_OTHER): Payer: Self-pay

## 2017-10-02 DIAGNOSIS — I2510 Atherosclerotic heart disease of native coronary artery without angina pectoris: Secondary | ICD-10-CM | POA: Diagnosis not present

## 2017-10-02 DIAGNOSIS — I10 Essential (primary) hypertension: Secondary | ICD-10-CM | POA: Diagnosis not present

## 2017-10-02 DIAGNOSIS — I252 Old myocardial infarction: Secondary | ICD-10-CM | POA: Insufficient documentation

## 2017-10-02 LAB — MYOCARDIAL PERFUSION IMAGING
CHL CUP NUCLEAR SRS: 0
CHL CUP NUCLEAR SSS: 0
CSEPPHR: 79 {beats}/min
LV sys vol: 25 mL
LVDIAVOL: 76 mL (ref 62–150)
Rest HR: 54 {beats}/min
SDS: 0
TID: 1.01

## 2017-10-02 MED ORDER — REGADENOSON 0.4 MG/5ML IV SOLN
0.4000 mg | Freq: Once | INTRAVENOUS | Status: AC
  Administered 2017-10-02: 0.4 mg via INTRAVENOUS

## 2017-10-02 MED ORDER — TECHNETIUM TC 99M TETROFOSMIN IV KIT
32.2000 | PACK | Freq: Once | INTRAVENOUS | Status: AC | PRN
  Administered 2017-10-02: 32.2 via INTRAVENOUS
  Filled 2017-10-02: qty 33

## 2017-10-02 MED ORDER — TECHNETIUM TC 99M TETROFOSMIN IV KIT
10.3000 | PACK | Freq: Once | INTRAVENOUS | Status: AC | PRN
  Administered 2017-10-02: 10.3 via INTRAVENOUS
  Filled 2017-10-02: qty 11

## 2017-10-03 ENCOUNTER — Telehealth: Payer: Self-pay | Admitting: Interventional Cardiology

## 2017-10-03 NOTE — Telephone Encounter (Signed)
Informed pt of results. Pt verbalized understanding. 

## 2017-10-03 NOTE — Telephone Encounter (Signed)
° °  Patient returning call for results. Please call

## 2017-10-20 DIAGNOSIS — I2510 Atherosclerotic heart disease of native coronary artery without angina pectoris: Secondary | ICD-10-CM | POA: Diagnosis not present

## 2017-10-20 DIAGNOSIS — E782 Mixed hyperlipidemia: Secondary | ICD-10-CM | POA: Diagnosis not present

## 2017-10-20 DIAGNOSIS — I252 Old myocardial infarction: Secondary | ICD-10-CM | POA: Diagnosis not present

## 2017-10-20 DIAGNOSIS — H409 Unspecified glaucoma: Secondary | ICD-10-CM | POA: Diagnosis not present

## 2017-10-20 DIAGNOSIS — M1990 Unspecified osteoarthritis, unspecified site: Secondary | ICD-10-CM | POA: Diagnosis not present

## 2017-10-20 DIAGNOSIS — C8290 Follicular lymphoma, unspecified, unspecified site: Secondary | ICD-10-CM | POA: Diagnosis not present

## 2017-10-20 DIAGNOSIS — E1142 Type 2 diabetes mellitus with diabetic polyneuropathy: Secondary | ICD-10-CM | POA: Diagnosis not present

## 2017-10-20 DIAGNOSIS — Z7984 Long term (current) use of oral hypoglycemic drugs: Secondary | ICD-10-CM | POA: Diagnosis not present

## 2017-10-20 DIAGNOSIS — Z85038 Personal history of other malignant neoplasm of large intestine: Secondary | ICD-10-CM | POA: Diagnosis not present

## 2017-10-20 DIAGNOSIS — N400 Enlarged prostate without lower urinary tract symptoms: Secondary | ICD-10-CM | POA: Diagnosis not present

## 2017-10-20 DIAGNOSIS — I10 Essential (primary) hypertension: Secondary | ICD-10-CM | POA: Diagnosis not present

## 2017-11-03 DIAGNOSIS — Z961 Presence of intraocular lens: Secondary | ICD-10-CM | POA: Diagnosis not present

## 2017-12-22 DIAGNOSIS — Z0000 Encounter for general adult medical examination without abnormal findings: Secondary | ICD-10-CM | POA: Diagnosis not present

## 2017-12-22 DIAGNOSIS — E1165 Type 2 diabetes mellitus with hyperglycemia: Secondary | ICD-10-CM | POA: Diagnosis not present

## 2017-12-22 DIAGNOSIS — C189 Malignant neoplasm of colon, unspecified: Secondary | ICD-10-CM | POA: Diagnosis not present

## 2017-12-22 DIAGNOSIS — Z1389 Encounter for screening for other disorder: Secondary | ICD-10-CM | POA: Diagnosis not present

## 2017-12-22 DIAGNOSIS — K219 Gastro-esophageal reflux disease without esophagitis: Secondary | ICD-10-CM | POA: Diagnosis not present

## 2017-12-22 DIAGNOSIS — Z85038 Personal history of other malignant neoplasm of large intestine: Secondary | ICD-10-CM | POA: Diagnosis not present

## 2017-12-22 DIAGNOSIS — I252 Old myocardial infarction: Secondary | ICD-10-CM | POA: Diagnosis not present

## 2017-12-22 DIAGNOSIS — I779 Disorder of arteries and arterioles, unspecified: Secondary | ICD-10-CM | POA: Diagnosis not present

## 2017-12-22 DIAGNOSIS — E1142 Type 2 diabetes mellitus with diabetic polyneuropathy: Secondary | ICD-10-CM | POA: Diagnosis not present

## 2017-12-22 DIAGNOSIS — N400 Enlarged prostate without lower urinary tract symptoms: Secondary | ICD-10-CM | POA: Diagnosis not present

## 2017-12-22 DIAGNOSIS — Z6839 Body mass index (BMI) 39.0-39.9, adult: Secondary | ICD-10-CM | POA: Diagnosis not present

## 2017-12-22 DIAGNOSIS — E782 Mixed hyperlipidemia: Secondary | ICD-10-CM | POA: Diagnosis not present

## 2017-12-22 DIAGNOSIS — I739 Peripheral vascular disease, unspecified: Secondary | ICD-10-CM | POA: Diagnosis not present

## 2017-12-22 DIAGNOSIS — I10 Essential (primary) hypertension: Secondary | ICD-10-CM | POA: Diagnosis not present

## 2017-12-22 DIAGNOSIS — I2510 Atherosclerotic heart disease of native coronary artery without angina pectoris: Secondary | ICD-10-CM | POA: Diagnosis not present

## 2017-12-22 DIAGNOSIS — Z7984 Long term (current) use of oral hypoglycemic drugs: Secondary | ICD-10-CM | POA: Diagnosis not present

## 2018-02-02 ENCOUNTER — Inpatient Hospital Stay: Payer: PPO | Attending: Hematology

## 2018-02-02 ENCOUNTER — Ambulatory Visit (HOSPITAL_COMMUNITY)
Admission: RE | Admit: 2018-02-02 | Discharge: 2018-02-02 | Disposition: A | Discharge status: Home / Self Care | Payer: PPO | Source: Ambulatory Visit | Attending: Hematology | Admitting: Hematology

## 2018-02-02 DIAGNOSIS — Z9049 Acquired absence of other specified parts of digestive tract: Secondary | ICD-10-CM | POA: Insufficient documentation

## 2018-02-02 DIAGNOSIS — M25561 Pain in right knee: Secondary | ICD-10-CM | POA: Diagnosis not present

## 2018-02-02 DIAGNOSIS — D509 Iron deficiency anemia, unspecified: Secondary | ICD-10-CM

## 2018-02-02 DIAGNOSIS — M25562 Pain in left knee: Secondary | ICD-10-CM | POA: Insufficient documentation

## 2018-02-02 DIAGNOSIS — E119 Type 2 diabetes mellitus without complications: Secondary | ICD-10-CM | POA: Diagnosis not present

## 2018-02-02 DIAGNOSIS — Z87891 Personal history of nicotine dependence: Secondary | ICD-10-CM | POA: Diagnosis not present

## 2018-02-02 DIAGNOSIS — Z79899 Other long term (current) drug therapy: Secondary | ICD-10-CM | POA: Diagnosis not present

## 2018-02-02 DIAGNOSIS — Z8572 Personal history of non-Hodgkin lymphomas: Secondary | ICD-10-CM | POA: Diagnosis not present

## 2018-02-02 DIAGNOSIS — R918 Other nonspecific abnormal finding of lung field: Secondary | ICD-10-CM | POA: Insufficient documentation

## 2018-02-02 DIAGNOSIS — I10 Essential (primary) hypertension: Secondary | ICD-10-CM | POA: Insufficient documentation

## 2018-02-02 DIAGNOSIS — Z7982 Long term (current) use of aspirin: Secondary | ICD-10-CM | POA: Diagnosis not present

## 2018-02-02 DIAGNOSIS — Z85038 Personal history of other malignant neoplasm of large intestine: Secondary | ICD-10-CM | POA: Diagnosis not present

## 2018-02-02 DIAGNOSIS — C8290 Follicular lymphoma, unspecified, unspecified site: Secondary | ICD-10-CM

## 2018-02-02 DIAGNOSIS — Z7984 Long term (current) use of oral hypoglycemic drugs: Secondary | ICD-10-CM | POA: Insufficient documentation

## 2018-02-02 LAB — CMP (CANCER CENTER ONLY)
ALBUMIN: 3.8 g/dL (ref 3.5–5.0)
ALT: 37 U/L (ref 0–44)
ANION GAP: 9 (ref 5–15)
AST: 22 U/L (ref 15–41)
Alkaline Phosphatase: 49 U/L (ref 38–126)
BILIRUBIN TOTAL: 0.4 mg/dL (ref 0.3–1.2)
BUN: 16 mg/dL (ref 8–23)
CALCIUM: 9.3 mg/dL (ref 8.9–10.3)
CO2: 24 mmol/L (ref 22–32)
Chloride: 109 mmol/L (ref 98–111)
Creatinine: 1.26 mg/dL — ABNORMAL HIGH (ref 0.61–1.24)
GFR, Est AFR Am: >60 mL/min (ref >60)
GFR, Estimated: 55 mL/min — ABNORMAL LOW (ref >60)
Glucose, Bld: 176 mg/dL — ABNORMAL HIGH (ref 70–99)
Potassium: 4.6 mmol/L (ref 3.5–5.1)
Sodium: 142 mmol/L (ref 135–145)
Total Protein: 6.7 g/dL (ref 6.5–8.1)

## 2018-02-02 LAB — CBC WITH DIFFERENTIAL/PLATELET
Abs Immature Granulocytes: 0.03 10*3/uL (ref 0.00–0.07)
BASOS PCT: 1 %
Basophils Absolute: 0 10*3/uL (ref 0.0–0.1)
EOS ABS: 0 10*3/uL (ref 0.0–0.5)
Eosinophils Relative: 1 %
HCT: 39.7 % (ref 39.0–52.0)
Hemoglobin: 12.7 g/dL — ABNORMAL LOW (ref 13.0–17.0)
IMMATURE GRANULOCYTES: 1 %
Lymphocytes Relative: 21 %
Lymphs Abs: 1.2 10*3/uL (ref 0.7–4.0)
MCH: 30 pg (ref 26.0–34.0)
MCHC: 32 g/dL (ref 30.0–36.0)
MCV: 93.9 fL (ref 80.0–100.0)
Monocytes Absolute: 0.5 10*3/uL (ref 0.1–1.0)
Monocytes Relative: 9 %
Neutro Abs: 3.9 10*3/uL (ref 1.7–7.7)
Neutrophils Relative %: 67 %
PLATELETS: 160 10*3/uL (ref 150–400)
RBC: 4.23 MIL/uL (ref 4.22–5.81)
RDW: 13 % (ref 11.5–15.5)
WBC: 5.6 10*3/uL (ref 4.0–10.5)
nRBC: 0 % (ref 0.0–0.2)

## 2018-02-02 LAB — FERRITIN: Ferritin: 129 ng/mL (ref 24–336)

## 2018-02-02 LAB — CEA (IN HOUSE-CHCC): CEA (CHCC-In House): 2.51 ng/mL (ref 0.00–5.00)

## 2018-02-02 LAB — LACTATE DEHYDROGENASE: LDH: 140 U/L (ref 98–192)

## 2018-02-03 NOTE — Progress Notes (Signed)
Mark Kitchen  HEMATOLOGY ONCOLOGY PROGRESS NOTE  Date of service: 02/04/18    Patient Care Team: Wenda Low, MD as PCP - General (Internal Medicine) Belva Crome, MD as PCP - Cardiology (Cardiology)  CC:  f/u for  history of colon cancer  low grade follicular NHL   INTERVAL HISTORY:  Mark West is here for follow-up for his history of colon cancer and low-grade follicular non-Hodgkin's lymphoma which has not required treatment. He has also had history of previous iron deficiency anemia thought to be from colonic polyps. The patient's last visit with [place] was on 08/15/17. The pt reports that he is doing well overall.  The pt notes that he is seeking evaluation for his bilateral knee pain, and notes that this has been steadily more bothersome for him. The pt reports that he has not developed any other new concerns. He endorses eating well, stable weight, and good energy levels. He denies any abdominal pains, and is moving his bowels well, without signs of blood or mucous.  Of note since the patient's last visit, pt has had a CT C/A/P completed on 02/02/18 with results revealing Status post right hemicolectomy. No findings suspicious for recurrent tumor. 2. Stable scattered bilateral pulmonary nodules but no new or progressive findings. 3. No findings for lymphadenopathy involving the chest, abdomen or pelvis. 4. Stable atherosclerotic calcifications involving the thoracic and abdominal aorta and branch vessels.  Lab results (02/02/18) of CBC w/diff and CMP is as follows: all values are WNL except for HGB at 12.7, Glucose at 176, Creatinine at 1.26, GFR at 55. 02/02/18 LDH at 140 02/02/18 CEA at 2.51 02/02/18 Ferritin at 129  On review of systems, pt reports good energy levels, stable weight, eating well, stable ankle swelling, moving his bowels well, and denies fevers, chills, night sweats, abdominal pains, blood or mucous in the stools, and any other symptoms.   REVIEW OF SYSTEMS:     A 10+ POINT REVIEW OF SYSTEMS WAS OBTAINED including neurology, dermatology, psychiatry, cardiac, respiratory, lymph, extremities, GI, GU, Musculoskeletal, constitutional, breasts, reproductive, HEENT.  All pertinent positives are noted in the HPI.  All others are negative.   . Past Medical History:  Diagnosis Date  . Anemia, iron deficiency 01/26/2011  . Arthritis   . Asthma   . Cancer (HCC)    nhl, colon ca  . Carotid artery occlusion    right occlusion of carotid with moderate left plaque  . Coronary artery disease August 2005   MI with stent  . Diabetes mellitus   . Hyperlipidemia   . Hypertension   . Kidney stones 05/2010   lithotripsy  . Myocardial infarction ([place])     . Past Surgical History:  Procedure Laterality Date  . CARPAL TUNNEL RELEASE Left 05/08/2015   Procedure: CARPAL TUNNEL RELEASE;  Surgeon: Charlotte Crumb, MD;  Location: [facility];  Service: Orthopedics;  Laterality: Left;  . COLON SURGERY    . COLONOSCOPY    . CORONARY STENT PLACEMENT    . HERNIA REPAIR    . OPEN REDUCTION INTERNAL FIXATION (ORIF) DISTAL RADIAL FRACTURE Left 05/08/2015   Procedure: OPEN REDUCTION INTERNAL FIXATION (ORIF) DISTAL RADIAL FRACTURE, CARPAL TUNNEL RELEASE;  Surgeon: Charlotte Crumb, MD;  Location: [place];  Service: Orthopedics;  Laterality: Left;  . REPAIR OF COMPLEX TRACTION RETINAL DETACHMENT      . Social History   Tobacco Use  . Smoking status: Former Smoker    Packs/day: 1.00    Years: 20.00  Pack years: 20.00    Last attempt to quit: 01/08/1980    Years since quitting: 38.1  . Smokeless tobacco: Never Used  Substance Use Topics  . Alcohol use: No    Alcohol/week: 0.0 standard drinks  . Drug use: No    ALLERGIES:  is allergic to iodine.  MEDICATIONS:  Current Outpatient Medications  Medication Sig Dispense Refill  . aspirin 81 MG tablet Take 81 mg by mouth daily.      Mark Kitchen atorvastatin (LIPITOR) 40 MG tablet Take 40 mg by mouth daily.      . clopidogrel  (PLAVIX) 75 MG tablet Take 75 mg by mouth daily.      . Coenzyme Q10 (CO Q 10 PO) Take 1 capsule by mouth daily.     . dorzolamide-timolol (COSOPT) 22.3-6.8 MG/ML ophthalmic solution Place 1 drop into both eyes 2 (two) times daily.    Mark Kitchen glucosamine-chondroitin 500-400 MG tablet Take 1 tablet by mouth daily.     Mark Kitchen losartan (COZAAR) 50 MG tablet Take 50 mg by mouth daily.  3  . Lutein 10 MG TABS Take 1 tablet by mouth daily.     Mark Kitchen LYCOPENE PO Take 1 tablet by mouth daily.     . metFORMIN (GLUCOPHAGE) 500 MG tablet Take 1,000 mg by mouth 2 (two) times daily with a meal.    . metoprolol (LOPRESSOR) 50 MG tablet Take 25 mg by mouth 2 (two) times daily.    . nitroGLYCERIN (NITROSTAT) 0.4 MG SL tablet Place 0.4 mg under the tongue every 5 (five) minutes x 3 doses as needed for chest pain.    . Omega-3 Fatty Acids (FISH OIL) 1000 MG CAPS Take 1 capsule by mouth daily.    . pantoprazole (PROTONIX) 40 MG tablet Take 40 mg by mouth daily.     . sitaGLIPtin (JANUVIA) 100 MG tablet Take 100 mg by mouth daily.       No current facility-administered medications for this visit.     PHYSICAL EXAMINATION: ECOG PERFORMANCE STATUS: 2 - Symptomatic, <50% confined to bed  Vitals:   02/04/18 1007  BP: (!) 158/73  Pulse: 63  Resp: 18  Temp: 98.2 F (36.8 C)  SpO2: 98%    Filed Weights   02/04/18 1007  Weight: 252 lb 1.6 oz (114.4 kg)   .Body mass index is 37.23 kg/m.  GENERAL:alert, in no acute distress and comfortable SKIN: no acute rashes, no significant lesions EYES: conjunctiva are pink and non-injected, sclera anicteric OROPHARYNX: MMM, no exudates, no oropharyngeal erythema or ulceration NECK: supple, no JVD LYMPH:  no palpable lymphadenopathy in the cervical, axillary or inguinal regions LUNGS: clear to auscultation b/l with normal respiratory effort HEART: regular rate & rhythm ABDOMEN:  normoactive bowel sounds , non tender, not distended. No palpable hepatosplenomegaly.  Extremity: 1+  pedal edema PSYCH: alert & oriented x 3 with fluent speech NEURO: no focal motor/sensory deficits   LABORATORY DATA:   I have reviewed the data as listed  . CBC Latest Ref Rng & Units 02/02/2018 08/15/2017 05/26/2017  WBC 4.0 - 10.5 K/uL 5.6 5.2 6.0  Hemoglobin 13.0 - 17.0 g/dL 12.7(L) 13.2 12.6(L)  Hematocrit 39.0 - 52.0 % 39.7 39.9 39.7  Platelets 150 - 400 K/uL 160 137(L) 150    CBC    Component Value Date/Time   WBC 5.6 02/02/2018 0858   RBC 4.23 02/02/2018 0858   HGB 12.7 (L) 02/02/2018 0858   HGB 13.2 08/15/2017 0947   HGB 10.9 (L) 12/26/2016  1400   HCT 39.7 02/02/2018 0858   HCT 35.0 (L) 12/26/2016 1400   PLT 160 02/02/2018 0858   PLT 137 (L) 08/15/2017 0947   PLT 196 12/26/2016 1400   MCV 93.9 02/02/2018 0858   MCV 91.1 12/26/2016 1400   MCH 30.0 02/02/2018 0858   MCHC 32.0 02/02/2018 0858   RDW 13.0 02/02/2018 0858   RDW 14.6 12/26/2016 1400   LYMPHSABS 1.2 02/02/2018 0858   LYMPHSABS 1.5 12/26/2016 1400   MONOABS 0.5 02/02/2018 0858   MONOABS 0.4 12/26/2016 1400   EOSABS 0.0 02/02/2018 0858   EOSABS 0.1 12/26/2016 1400   BASOSABS 0.0 02/02/2018 0858   BASOSABS 0.0 12/26/2016 1400    . CMP Latest Ref Rng & Units 02/02/2018 08/15/2017 04/18/2017  Glucose 70 - 99 mg/dL 176(H) 231(H) 188(H)  BUN 8 - 23 mg/dL 16 18 17   Creatinine 0.61 - 1.24 mg/dL 1.26(H) 1.20 1.24  Sodium 135 - 145 mmol/L 142 139 141  Potassium 3.5 - 5.1 mmol/L 4.6 4.4 4.2  Chloride 98 - 111 mmol/L 109 109 109  CO2 22 - 32 mmol/L 24 20(L) 22  Calcium 8.9 - 10.3 mg/dL 9.3 8.4(L) 9.1  Total Protein 6.5 - 8.1 g/dL 6.7 6.4(L) 6.4  Total Bilirubin 0.3 - 1.2 mg/dL 0.4 0.6 0.5  Alkaline Phos 38 - 126 U/L 49 55 53  AST 15 - 41 U/L 22 18 20   ALT 0 - 44 U/L 37 29 28   . Lab Results  Component Value Date   IRON 41 (L) 12/28/2015   TIBC 334 12/28/2015   IRONPCTSAT 12 (L) 12/28/2015   (Iron and TIBC)  Lab Results  Component Value Date   FERRITIN 129 02/02/2018    Lab Results  Component  Value Date   LDH 140 02/02/2018    RADIOGRAPHIC STUDIES: I have personally reviewed the radiological images as listed and agreed with the findings in the report.  Ct chest/abdpelvis12/2018: IMPRESSION: 1. Status post right hemicolectomy with circumferential wall thickening in the neo terminal ileum extending into the ileocolic anastomosis and having asymmetric wall thickening in the proximal post anastomotic colon. There is pericolonic edema/inflammation tracking down the remaining segment of the colon to the rectum with interval increase in size of previously seen mesenteric lymph nodes adjacent to the anastomosis and interval development of multiple tiny perirectal nodules. These imaging features may be related to inflammatory bowel disease although infectious etiology neoplasm could have this appearance. 2. Apparent wall thickening in the rectum. 3. Potential pedunculated polyp in the neo terminal ileum. 4.  Aortic Atherosclerois (ICD10-170.0) 5. Multiple tiny pulmonary nodules in the lower lungs. Many of these were seen on the previous abdomen and pelvis CT and are stable in the interval. One particular 3 mm nodule in the left lower lobe was not included on the prior study and attention to this on follow-up imaging is recommended.   ASSESSMENT & PLAN:   1.Follicular NHL Patient has never required treatment for this.  no obvious progression by CT [facility] 11-2015 -CT abd 11/27/2016 findings were likely related to his E Coli colonic infection and has clinically resolved with levofloxacin rx from our clinic. . Lab Results  Component Value Date   LDH 140 02/02/2018   Plan -Patient has no clinical or lab evidence of lymphoma progression at this time. -No overtly palpable significantly enlarged peripheral lymphadenopathy. LDH level is within normal limits. No reported constitutional symptoms.  -will need to be monitored with interval imaging in 6-12 months  2. Iron  deficiency anemia previously related to GI bleeding from nonmalignant inflammatory anastomotic colon polyp.  Iron deficiency persistent. S/p IV injectafer x 2 injan-feb 2019. hgb has improved from 11 to 13  Lab Results  Component Value Date   FERRITIN 129 02/02/2018    Plan -continue maintenance Iron polysaccharide 150mg  daily -monitor for ongoing gi losses  3.remote history of synchronous colon cancers. Colonoscopy planned 12/08-2016 by Dr Amedeo Plenty, has been performed. We do not have records yet, however, the patient reported that Dr Amedeo Plenty felt positive about his results. We will follow up on this.   CT abd -- showed inflammation due to E.coli  CEA 2.3 on 8//2019   PLAN -Discussed pt labwork from 02/02/18; blood counts and chemistries are stable. LDH at 140. CEA at 2.51. Ferritin at 129. -Discussed the 02/02/18 CT C/A/P which revealed Status post right hemicolectomy. No findings suspicious for recurrent tumor. 2. Stable scattered bilateral pulmonary nodules but no new or progressive findings. 3. No findings for lymphadenopathy involving the chest, abdomen or pelvis. 4. Stable atherosclerotic calcifications involving the thoracic and abdominal aorta and branch vessels. -The pt shows no clinical, radiographic, or lab progression of his colon cancer nor follicular lymphoma at this time.  -No indication for further treatment of either his Follicular Lymphoma nor Colon cancer, at this time.  -Pt's last colonoscopy was December 2018 and will repeat in 2 years -he is still on dual antiplatelet therapy which increase his risk of chronic GI losses. -will track Hgb and ferritin levels in 6 months. -In light of the patient's ankle swelling, we recommend that he follow up with his cardiologist or PCP and use sports compression socks.  -Recommend decreasing salt intake. -Will see the pt back in 6 months   RTC with Dr Irene Limbo with labs in  6 months    The total time spent in the appt was 25 minutes and  more than 50% was on counseling and direct patient cares.    Sullivan Lone MD [place] AAHIVMS [facility] Hematology/Oncology Physician [facility]  (Office):       [phone] (Work cell):  [phone] (Fax):           [phone]  I, Baldwin Jamaica, am acting as a scribe for Dr. Sullivan Lone.   .I have reviewed the above documentation for accuracy and completeness, and I agree with the above. Brunetta Genera MD

## 2018-02-04 ENCOUNTER — Inpatient Hospital Stay (HOSPITAL_BASED_OUTPATIENT_CLINIC_OR_DEPARTMENT_OTHER): Payer: PPO | Admitting: Hematology

## 2018-02-04 ENCOUNTER — Telehealth: Payer: Self-pay

## 2018-02-04 VITALS — BP 158/73 | HR 63 | Temp 98.2°F | Resp 18 | Ht 69.0 in | Wt 252.1 lb

## 2018-02-04 DIAGNOSIS — Z87891 Personal history of nicotine dependence: Secondary | ICD-10-CM | POA: Diagnosis not present

## 2018-02-04 DIAGNOSIS — M25562 Pain in left knee: Secondary | ICD-10-CM | POA: Diagnosis not present

## 2018-02-04 DIAGNOSIS — R918 Other nonspecific abnormal finding of lung field: Secondary | ICD-10-CM

## 2018-02-04 DIAGNOSIS — C8203 Follicular lymphoma grade I, intra-abdominal lymph nodes: Secondary | ICD-10-CM

## 2018-02-04 DIAGNOSIS — Z8572 Personal history of non-Hodgkin lymphomas: Secondary | ICD-10-CM

## 2018-02-04 DIAGNOSIS — I10 Essential (primary) hypertension: Secondary | ICD-10-CM

## 2018-02-04 DIAGNOSIS — M25561 Pain in right knee: Secondary | ICD-10-CM | POA: Diagnosis not present

## 2018-02-04 DIAGNOSIS — E119 Type 2 diabetes mellitus without complications: Secondary | ICD-10-CM | POA: Diagnosis not present

## 2018-02-04 DIAGNOSIS — Z85038 Personal history of other malignant neoplasm of large intestine: Secondary | ICD-10-CM | POA: Diagnosis not present

## 2018-02-04 DIAGNOSIS — Z7984 Long term (current) use of oral hypoglycemic drugs: Secondary | ICD-10-CM

## 2018-02-04 DIAGNOSIS — Z9049 Acquired absence of other specified parts of digestive tract: Secondary | ICD-10-CM

## 2018-02-04 DIAGNOSIS — Z79899 Other long term (current) drug therapy: Secondary | ICD-10-CM

## 2018-02-04 DIAGNOSIS — Z7982 Long term (current) use of aspirin: Secondary | ICD-10-CM

## 2018-02-04 NOTE — Telephone Encounter (Signed)
Printed avs and calender of upcoming appointment. Per 1/29 los 

## 2018-02-10 DIAGNOSIS — H4051X3 Glaucoma secondary to other eye disorders, right eye, severe stage: Secondary | ICD-10-CM | POA: Diagnosis not present

## 2018-02-10 DIAGNOSIS — H40022 Open angle with borderline findings, high risk, left eye: Secondary | ICD-10-CM | POA: Diagnosis not present

## 2018-05-28 DIAGNOSIS — I252 Old myocardial infarction: Secondary | ICD-10-CM | POA: Diagnosis not present

## 2018-05-28 DIAGNOSIS — Z85038 Personal history of other malignant neoplasm of large intestine: Secondary | ICD-10-CM | POA: Diagnosis not present

## 2018-05-28 DIAGNOSIS — E1142 Type 2 diabetes mellitus with diabetic polyneuropathy: Secondary | ICD-10-CM | POA: Diagnosis not present

## 2018-05-28 DIAGNOSIS — Z7984 Long term (current) use of oral hypoglycemic drugs: Secondary | ICD-10-CM | POA: Diagnosis not present

## 2018-05-28 DIAGNOSIS — I10 Essential (primary) hypertension: Secondary | ICD-10-CM | POA: Diagnosis not present

## 2018-05-28 DIAGNOSIS — N400 Enlarged prostate without lower urinary tract symptoms: Secondary | ICD-10-CM | POA: Diagnosis not present

## 2018-05-28 DIAGNOSIS — H409 Unspecified glaucoma: Secondary | ICD-10-CM | POA: Diagnosis not present

## 2018-05-28 DIAGNOSIS — M1990 Unspecified osteoarthritis, unspecified site: Secondary | ICD-10-CM | POA: Diagnosis not present

## 2018-05-28 DIAGNOSIS — I2510 Atherosclerotic heart disease of native coronary artery without angina pectoris: Secondary | ICD-10-CM | POA: Diagnosis not present

## 2018-05-28 DIAGNOSIS — E782 Mixed hyperlipidemia: Secondary | ICD-10-CM | POA: Diagnosis not present

## 2018-06-25 DIAGNOSIS — M25561 Pain in right knee: Secondary | ICD-10-CM | POA: Diagnosis not present

## 2018-06-25 DIAGNOSIS — M25562 Pain in left knee: Secondary | ICD-10-CM | POA: Diagnosis not present

## 2018-07-06 DIAGNOSIS — H409 Unspecified glaucoma: Secondary | ICD-10-CM | POA: Diagnosis not present

## 2018-07-06 DIAGNOSIS — N400 Enlarged prostate without lower urinary tract symptoms: Secondary | ICD-10-CM | POA: Diagnosis not present

## 2018-07-06 DIAGNOSIS — E782 Mixed hyperlipidemia: Secondary | ICD-10-CM | POA: Diagnosis not present

## 2018-07-06 DIAGNOSIS — C189 Malignant neoplasm of colon, unspecified: Secondary | ICD-10-CM | POA: Diagnosis not present

## 2018-07-06 DIAGNOSIS — I2510 Atherosclerotic heart disease of native coronary artery without angina pectoris: Secondary | ICD-10-CM | POA: Diagnosis not present

## 2018-07-06 DIAGNOSIS — I252 Old myocardial infarction: Secondary | ICD-10-CM | POA: Diagnosis not present

## 2018-07-06 DIAGNOSIS — K219 Gastro-esophageal reflux disease without esophagitis: Secondary | ICD-10-CM | POA: Diagnosis not present

## 2018-07-06 DIAGNOSIS — Z7984 Long term (current) use of oral hypoglycemic drugs: Secondary | ICD-10-CM | POA: Diagnosis not present

## 2018-07-06 DIAGNOSIS — E1142 Type 2 diabetes mellitus with diabetic polyneuropathy: Secondary | ICD-10-CM | POA: Diagnosis not present

## 2018-07-06 DIAGNOSIS — M1990 Unspecified osteoarthritis, unspecified site: Secondary | ICD-10-CM | POA: Diagnosis not present

## 2018-07-06 DIAGNOSIS — H353123 Nonexudative age-related macular degeneration, left eye, advanced atrophic without subfoveal involvement: Secondary | ICD-10-CM | POA: Diagnosis not present

## 2018-07-06 DIAGNOSIS — Z85038 Personal history of other malignant neoplasm of large intestine: Secondary | ICD-10-CM | POA: Diagnosis not present

## 2018-07-06 DIAGNOSIS — H5310 Unspecified subjective visual disturbances: Secondary | ICD-10-CM | POA: Diagnosis not present

## 2018-07-06 DIAGNOSIS — I10 Essential (primary) hypertension: Secondary | ICD-10-CM | POA: Diagnosis not present

## 2018-07-06 DIAGNOSIS — E119 Type 2 diabetes mellitus without complications: Secondary | ICD-10-CM | POA: Diagnosis not present

## 2018-07-13 DIAGNOSIS — H353123 Nonexudative age-related macular degeneration, left eye, advanced atrophic without subfoveal involvement: Secondary | ICD-10-CM | POA: Diagnosis not present

## 2018-07-13 DIAGNOSIS — H5310 Unspecified subjective visual disturbances: Secondary | ICD-10-CM | POA: Diagnosis not present

## 2018-07-24 DIAGNOSIS — H2521 Age-related cataract, morgagnian type, right eye: Secondary | ICD-10-CM | POA: Diagnosis not present

## 2018-07-24 DIAGNOSIS — H31092 Other chorioretinal scars, left eye: Secondary | ICD-10-CM | POA: Diagnosis not present

## 2018-07-24 DIAGNOSIS — H353122 Nonexudative age-related macular degeneration, left eye, intermediate dry stage: Secondary | ICD-10-CM | POA: Diagnosis not present

## 2018-07-24 DIAGNOSIS — H3581 Retinal edema: Secondary | ICD-10-CM | POA: Diagnosis not present

## 2018-08-06 DIAGNOSIS — M25561 Pain in right knee: Secondary | ICD-10-CM | POA: Diagnosis not present

## 2018-08-06 DIAGNOSIS — M170 Bilateral primary osteoarthritis of knee: Secondary | ICD-10-CM | POA: Diagnosis not present

## 2018-08-06 DIAGNOSIS — R600 Localized edema: Secondary | ICD-10-CM | POA: Diagnosis not present

## 2018-08-10 ENCOUNTER — Inpatient Hospital Stay: Payer: PPO | Admitting: Hematology

## 2018-08-10 ENCOUNTER — Inpatient Hospital Stay: Payer: PPO

## 2018-08-24 ENCOUNTER — Telehealth: Payer: Self-pay | Admitting: *Deleted

## 2018-08-24 NOTE — Telephone Encounter (Signed)
Patient called - states he is having problems with arthritis in legs and feet and it is causing swelling. He has an appt with Dr.Kale on Wednesday for a check up r/t his lymphoma. He wondered if he could get lab work for his arthritis at the same time. Advised him to first contact the doctor that treats him for arthritis to inform of the swelling, as he may need treatment. He can discuss the tests he wants with Dr. Irene Limbo during the appt so that Dr. Irene Limbo can decide about ordering additional lab tests. Patient verbalized understanding.

## 2018-08-26 ENCOUNTER — Inpatient Hospital Stay (HOSPITAL_BASED_OUTPATIENT_CLINIC_OR_DEPARTMENT_OTHER): Payer: PPO | Admitting: Hematology

## 2018-08-26 ENCOUNTER — Other Ambulatory Visit: Payer: Self-pay

## 2018-08-26 ENCOUNTER — Telehealth: Payer: Self-pay | Admitting: Hematology

## 2018-08-26 ENCOUNTER — Inpatient Hospital Stay: Payer: PPO | Attending: Hematology

## 2018-08-26 VITALS — BP 146/53 | HR 62 | Temp 98.3°F | Resp 18 | Ht 69.0 in | Wt 241.1 lb

## 2018-08-26 DIAGNOSIS — C8203 Follicular lymphoma grade I, intra-abdominal lymph nodes: Secondary | ICD-10-CM

## 2018-08-26 DIAGNOSIS — Z79899 Other long term (current) drug therapy: Secondary | ICD-10-CM | POA: Insufficient documentation

## 2018-08-26 DIAGNOSIS — I10 Essential (primary) hypertension: Secondary | ICD-10-CM | POA: Diagnosis not present

## 2018-08-26 DIAGNOSIS — Z7982 Long term (current) use of aspirin: Secondary | ICD-10-CM | POA: Diagnosis not present

## 2018-08-26 DIAGNOSIS — D509 Iron deficiency anemia, unspecified: Secondary | ICD-10-CM

## 2018-08-26 DIAGNOSIS — Z85038 Personal history of other malignant neoplasm of large intestine: Secondary | ICD-10-CM | POA: Diagnosis not present

## 2018-08-26 DIAGNOSIS — E119 Type 2 diabetes mellitus without complications: Secondary | ICD-10-CM | POA: Diagnosis not present

## 2018-08-26 DIAGNOSIS — Z8572 Personal history of non-Hodgkin lymphomas: Secondary | ICD-10-CM | POA: Insufficient documentation

## 2018-08-26 DIAGNOSIS — E785 Hyperlipidemia, unspecified: Secondary | ICD-10-CM | POA: Insufficient documentation

## 2018-08-26 DIAGNOSIS — Z87891 Personal history of nicotine dependence: Secondary | ICD-10-CM | POA: Insufficient documentation

## 2018-08-26 DIAGNOSIS — I252 Old myocardial infarction: Secondary | ICD-10-CM | POA: Diagnosis not present

## 2018-08-26 DIAGNOSIS — Z7984 Long term (current) use of oral hypoglycemic drugs: Secondary | ICD-10-CM | POA: Insufficient documentation

## 2018-08-26 DIAGNOSIS — J45909 Unspecified asthma, uncomplicated: Secondary | ICD-10-CM | POA: Insufficient documentation

## 2018-08-26 LAB — CBC WITH DIFFERENTIAL/PLATELET
Abs Immature Granulocytes: 0.03 10*3/uL (ref 0.00–0.07)
Basophils Absolute: 0.1 10*3/uL (ref 0.0–0.1)
Basophils Relative: 1 %
Eosinophils Absolute: 0 10*3/uL (ref 0.0–0.5)
Eosinophils Relative: 1 %
HCT: 41.5 % (ref 39.0–52.0)
Hemoglobin: 13.3 g/dL (ref 13.0–17.0)
Immature Granulocytes: 1 %
Lymphocytes Relative: 19 %
Lymphs Abs: 1.3 10*3/uL (ref 0.7–4.0)
MCH: 30.4 pg (ref 26.0–34.0)
MCHC: 32 g/dL (ref 30.0–36.0)
MCV: 94.7 fL (ref 80.0–100.0)
Monocytes Absolute: 0.6 10*3/uL (ref 0.1–1.0)
Monocytes Relative: 10 %
Neutro Abs: 4.5 10*3/uL (ref 1.7–7.7)
Neutrophils Relative %: 68 %
Platelets: 175 10*3/uL (ref 150–400)
RBC: 4.38 MIL/uL (ref 4.22–5.81)
RDW: 13.1 % (ref 11.5–15.5)
WBC: 6.5 10*3/uL (ref 4.0–10.5)
nRBC: 0 % (ref 0.0–0.2)

## 2018-08-26 LAB — CMP (CANCER CENTER ONLY)
ALT: 27 U/L (ref 0–44)
AST: 18 U/L (ref 15–41)
Albumin: 3.8 g/dL (ref 3.5–5.0)
Alkaline Phosphatase: 50 U/L (ref 38–126)
Anion gap: 11 (ref 5–15)
BUN: 29 mg/dL — ABNORMAL HIGH (ref 8–23)
CO2: 18 mmol/L — ABNORMAL LOW (ref 22–32)
Calcium: 8.9 mg/dL (ref 8.9–10.3)
Chloride: 109 mmol/L (ref 98–111)
Creatinine: 1.47 mg/dL — ABNORMAL HIGH (ref 0.61–1.24)
GFR, Est AFR Am: 53 mL/min — ABNORMAL LOW (ref >60)
GFR, Estimated: 46 mL/min — ABNORMAL LOW (ref >60)
Glucose, Bld: 171 mg/dL — ABNORMAL HIGH (ref 70–99)
Potassium: 4.5 mmol/L (ref 3.5–5.1)
Sodium: 138 mmol/L (ref 135–145)
Total Bilirubin: 0.4 mg/dL (ref 0.3–1.2)
Total Protein: 6.9 g/dL (ref 6.5–8.1)

## 2018-08-26 LAB — CEA (IN HOUSE-CHCC): CEA (CHCC-In House): 2.72 ng/mL (ref 0.00–5.00)

## 2018-08-26 LAB — LACTATE DEHYDROGENASE: LDH: 141 U/L (ref 98–192)

## 2018-08-26 NOTE — Progress Notes (Signed)
Marland Kitchen  HEMATOLOGY ONCOLOGY PROGRESS NOTE  Date of service: 08/26/18    Patient Care Team: Wenda Low, MD as PCP - General (Internal Medicine) Belva Crome, MD as PCP - Cardiology (Cardiology)  CC:  f/u for  history of colon cancer  low grade follicular NHL    INTERVAL HISTORY: Mark Giovanne Nickolson" West is here for follow-up for his history of colon cancer and low-grade follicular non-Hodgkin's lymphoma which has not required treatment. He has also had history of previous iron deficiency anemia thought to be from colonic polyps. The patient's last visit with [place] was on 02/04/2018. The pt reports that he is doing well overall.  The pt reports that his experiencing bad arthritis and he notes severe swelling in his right legs and feet. He said that he had a prior meniscus tear in his left knee, and he had an operation to repair that some time ago. He is also experiencing right hip pain.   Lab results today (08/26/18) of CBC w/diff and CMP is as follows: all values are WNL except for CO2 at 18, glucose at 171, BUN at 29, creatinine at 1.47, GFR est non af am at 46, and GFR est AFR am at 53.  Lactate dehydrogenase is .Marland Kitchen Lab Results  Component Value Date   LDH 141 08/26/2018   Lab Results  Component Value Date   CEA 1.5 03/22/2011    On review of systems, pt denies changes in bowel habits, open mouth sores, fevers, night sweats, weight loss and any other symptoms.    REVIEW OF SYSTEMS:   A 10+ POINT REVIEW OF SYSTEMS WAS OBTAINED including neurology, dermatology, psychiatry, cardiac, respiratory, lymph, extremities, GI, GU, Musculoskeletal, constitutional, breasts, reproductive, HEENT.  All pertinent positives are noted in the HPI.  All others are negative.    . Past Medical History:  Diagnosis Date  . Anemia, iron deficiency 01/26/2011  . Arthritis   . Asthma   . Cancer (HCC)    nhl, colon ca  . Carotid artery occlusion    right occlusion of carotid with moderate left  plaque  . Coronary artery disease August 2005   MI with stent  . Diabetes mellitus   . Hyperlipidemia   . Hypertension   . Kidney stones 05/2010   lithotripsy  . Myocardial infarction ([place])     . Past Surgical History:  Procedure Laterality Date  . CARPAL TUNNEL RELEASE Left 05/08/2015   Procedure: CARPAL TUNNEL RELEASE;  Surgeon: Charlotte Crumb, MD;  Location: [place];  Service: Orthopedics;  Laterality: Left;  . COLON SURGERY    . COLONOSCOPY    . CORONARY STENT PLACEMENT    . HERNIA REPAIR    . OPEN REDUCTION INTERNAL FIXATION (ORIF) DISTAL RADIAL FRACTURE Left 05/08/2015   Procedure: OPEN REDUCTION INTERNAL FIXATION (ORIF) DISTAL RADIAL FRACTURE, CARPAL TUNNEL RELEASE;  Surgeon: Charlotte Crumb, MD;  Location: [place];  Service: Orthopedics;  Laterality: Left;  . REPAIR OF COMPLEX TRACTION RETINAL DETACHMENT      . Social History   Tobacco Use  . Smoking status: Former Smoker    Packs/day: 1.00    Years: 20.00    Pack years: 20.00    Quit date: 01/08/1980    Years since quitting: 38.6  . Smokeless tobacco: Never Used  Substance Use Topics  . Alcohol use: No    Alcohol/week: 0.0 standard drinks  . Drug use: No    ALLERGIES:  is allergic to iodine.  MEDICATIONS:  Current Outpatient Medications  Medication Sig Dispense Refill  . aspirin 81 MG tablet Take 81 mg by mouth daily.      Marland Kitchen atorvastatin (LIPITOR) 40 MG tablet Take 40 mg by mouth daily.      . clopidogrel (PLAVIX) 75 MG tablet Take 75 mg by mouth daily.      . Coenzyme Q10 (CO Q 10 PO) Take 1 capsule by mouth daily.     . dorzolamide-timolol (COSOPT) 22.3-6.8 MG/ML ophthalmic solution Place 1 drop into both eyes 2 (two) times daily.    Marland Kitchen glucosamine-chondroitin 500-400 MG tablet Take 1 tablet by mouth daily.     Marland Kitchen losartan (COZAAR) 50 MG tablet Take 50 mg by mouth daily.  3  . Lutein 10 MG TABS Take 1 tablet by mouth daily.     Marland Kitchen LYCOPENE PO Take 1 tablet by mouth daily.     . metFORMIN (GLUCOPHAGE) 500 MG  tablet Take 1,000 mg by mouth 2 (two) times daily with a meal.    . metoprolol (LOPRESSOR) 50 MG tablet Take 25 mg by mouth 2 (two) times daily.    . nitroGLYCERIN (NITROSTAT) 0.4 MG SL tablet Place 0.4 mg under the tongue every 5 (five) minutes x 3 doses as needed for chest pain.    . Omega-3 Fatty Acids (FISH OIL) 1000 MG CAPS Take 1 capsule by mouth daily.    . pantoprazole (PROTONIX) 40 MG tablet Take 40 mg by mouth daily.     . sitaGLIPtin (JANUVIA) 100 MG tablet Take 100 mg by mouth daily.       No current facility-administered medications for this visit.     PHYSICAL EXAMINATION: ECOG PERFORMANCE STATUS: 2 - Symptomatic, <50% confined to bed  Vitals:   08/26/18 0915  BP: (!) 146/53  Pulse: 62  Resp: 18  Temp: 98.3 F (36.8 C)  SpO2: 98%    Filed Weights   08/26/18 0915  Weight: 241 lb 1.6 oz (109.4 kg)   .Body mass index is 35.6 kg/m.  GENERAL:alert, in no acute distress and comfortable SKIN: no acute rashes, no significant lesions EYES: conjunctiva are pink and non-injected, sclera anicteric OROPHARYNX: MMM, no exudates, no oropharyngeal erythema or ulceration NECK: supple, no JVD LYMPH:  no palpable lymphadenopathy in the cervical, axillary or inguinal regions LUNGS: clear to auscultation b/l with normal respiratory effort HEART: regular rate & rhythm ABDOMEN:  normoactive bowel sounds , non tender, not distended. Extremity: no pedal edema PSYCH: alert & oriented x 3 with fluent speech NEURO: no focal motor/sensory deficits    LABORATORY DATA:   I have reviewed the data as listed  . CBC Latest Ref Rng & Units 08/26/2018 02/02/2018 08/15/2017  WBC 4.0 - 10.5 K/uL 6.5 5.6 5.2  Hemoglobin 13.0 - 17.0 g/dL 13.3 12.7(L) 13.2  Hematocrit 39.0 - 52.0 % 41.5 39.7 39.9  Platelets 150 - 400 K/uL 175 160 137(L)    CBC    Component Value Date/Time   WBC 6.5 08/26/2018 0901   RBC 4.38 08/26/2018 0901   HGB 13.3 08/26/2018 0901   HGB 13.2 08/15/2017 0947   HGB  10.9 (L) 12/26/2016 1400   HCT 41.5 08/26/2018 0901   HCT 35.0 (L) 12/26/2016 1400   PLT 175 08/26/2018 0901   PLT 137 (L) 08/15/2017 0947   PLT 196 12/26/2016 1400   MCV 94.7 08/26/2018 0901   MCV 91.1 12/26/2016 1400   MCH 30.4 08/26/2018 0901   MCHC 32.0 08/26/2018 0901   RDW 13.1 08/26/2018 0901  RDW 14.6 12/26/2016 1400   LYMPHSABS 1.3 08/26/2018 0901   LYMPHSABS 1.5 12/26/2016 1400   MONOABS 0.6 08/26/2018 0901   MONOABS 0.4 12/26/2016 1400   EOSABS 0.0 08/26/2018 0901   EOSABS 0.1 12/26/2016 1400   BASOSABS 0.1 08/26/2018 0901   BASOSABS 0.0 12/26/2016 1400    . CMP Latest Ref Rng & Units 02/02/2018 08/15/2017 04/18/2017  Glucose 70 - 99 mg/dL 176(H) 231(H) 188(H)  BUN 8 - 23 mg/dL 16 18 17   Creatinine 0.61 - 1.24 mg/dL 1.26(H) 1.20 1.24  Sodium 135 - 145 mmol/L 142 139 141  Potassium 3.5 - 5.1 mmol/L 4.6 4.4 4.2  Chloride 98 - 111 mmol/L 109 109 109  CO2 22 - 32 mmol/L 24 20(L) 22  Calcium 8.9 - 10.3 mg/dL 9.3 8.4(L) 9.1  Total Protein 6.5 - 8.1 g/dL 6.7 6.4(L) 6.4  Total Bilirubin 0.3 - 1.2 mg/dL 0.4 0.6 0.5  Alkaline Phos 38 - 126 U/L 49 55 53  AST 15 - 41 U/L 22 18 20   ALT 0 - 44 U/L 37 29 28   . Lab Results  Component Value Date   IRON 41 (L) 12/28/2015   TIBC 334 12/28/2015   IRONPCTSAT 12 (L) 12/28/2015   (Iron and TIBC)  Lab Results  Component Value Date   FERRITIN 129 02/02/2018    Lab Results  Component Value Date   LDH 141 08/26/2018    RADIOGRAPHIC STUDIES: I have personally reviewed the radiological images as listed and agreed with the findings in the report.  Ct chest/abdpelvis12/2018: IMPRESSION: 1. Status post right hemicolectomy with circumferential wall thickening in the neo terminal ileum extending into the ileocolic anastomosis and having asymmetric wall thickening in the proximal post anastomotic colon. There is pericolonic edema/inflammation tracking down the remaining segment of the colon to the rectum with interval increase in  size of previously seen mesenteric lymph nodes adjacent to the anastomosis and interval development of multiple tiny perirectal nodules. These imaging features may be related to inflammatory bowel disease although infectious etiology neoplasm could have this appearance. 2. Apparent wall thickening in the rectum. 3. Potential pedunculated polyp in the neo terminal ileum. 4.  Aortic Atherosclerois (ICD10-170.0) 5. Multiple tiny pulmonary nodules in the lower lungs. Many of these were seen on the previous abdomen and pelvis CT and are stable in the interval. One particular 3 mm nodule in the left lower lobe was not included on the prior study and attention to this on follow-up imaging is recommended.  No results found.    ASSESSMENT & PLAN:   1.Follicular NHL Patient has never required treatment for this.  no obvious progression by CT [facility] 11-2015 -CT abd 11/27/2016 findings were likely related to his E Coli colonic infection and has clinically resolved with levofloxacin rx from our clinic. . Lab Results  Component Value Date   LDH 141 08/26/2018   2. Iron deficiency anemia previously related to GI bleeding from nonmalignant inflammatory anastomotic colon polyp.  Iron deficiency persistent. S/p IV injectafer x 2 injan-feb 2019. hgb has improved from 11 to 13  Lab Results  Component Value Date   FERRITIN 129 02/02/2018    3.remote history of synchronous colon cancers. Colonoscopy planned 12/08-2016 by Dr Amedeo Plenty, has been performed. We do not have records yet, however, the patient reported that Dr Amedeo Plenty felt positive about his results. We will follow up on this.   CT abd -- showed inflammation due to E.coli  02/02/18 CT C/A/P which revealed  Status post right hemicolectomy. No findings suspicious for recurrent tumor. 2. Stable scattered bilateral pulmonary nodules but no new or progressive findings. 3. No findings for lymphadenopathy involving the chest, abdomen or pelvis.  4. Stable atherosclerotic calcifications involving the thoracic and abdominal aorta and branch vessels.  CEA 2.72 on 08/26/2018     PLAN -Discussed pt labwork today, 08/26/18; all values are WNL except for CO2 at 18, glucose at 171, BUN at 29, creatinine at 1.47, GFR est non af am at 46, and GFR est AFR am at 53. -patient has no clinical or lab evidence of colon cancer or lymphoma recurrence/progression at this time. -Discussed pt taking oral iron supplements. -Discussed compression stockings to assist with leg swelling and pain.  - Discussed pts current contact with his Cardiologist.  - Discussed pushing back pts interval CT scans for interval re-evaluation.  FOLLOW UP: RTC with dr Irene Limbo with labs in 6 months    The total time spent in the appt was 20 minutes and more than 50% was on counseling and direct patient cares.     Sullivan Lone MD MS AAHIVMS [facility] Hematology/Oncology Physician [facility]  (Office):       [phone] (Work cell):  [phone] (Fax):           [phone]  I, Jacqualyn Posey, am acting as a scribe for Dr. Sullivan Lone.   .I have reviewed the above documentation for accuracy and completeness, and I agree with the above. Brunetta Genera MD

## 2018-08-26 NOTE — Telephone Encounter (Signed)
Scheduled per 08/19 los, patient received avs and calender.  °

## 2018-09-17 DIAGNOSIS — M25561 Pain in right knee: Secondary | ICD-10-CM | POA: Diagnosis not present

## 2018-09-17 DIAGNOSIS — M170 Bilateral primary osteoarthritis of knee: Secondary | ICD-10-CM | POA: Diagnosis not present

## 2018-09-20 NOTE — Progress Notes (Signed)
Cardiology Office Note   Date:  09/29/2018   ID:  Mark West., DOB May 21, 1940, MRN [id number]  PCP:  Wenda Low, MD  Cardiologist: Dr. Tamala Julian, MD   Chief Complaint  Patient presents with  . Follow-up    History of Present Illness: Mark West. is a 78 y.o. male with a hx of CAD with stent 2005 (location unknown and no records available), hypertension, hyperlipidemia, right carotid occlusion, diastolic heart failure, and diabetes mellitus, seen for Dr. Tamala Julian.   He was last seen by Dr. Tamala Julian 09/23/2017 and was noted to be very sedentary. He has a known occlusive right carotid artery that is 100% obstructed with moderate left CAD. He denied palpitations or syncope at that time. Due to his overt sedentary lifestyle, planned stress test was performed 09/30/2017 and was found to be normal, low risk without ischemia.   Today he is here for 1 year follow up.  He is doing very well from a cardiac perspective.  Most of his complaints today are in relation to his bilateral knee pain.  He has been followed by orthopedics for possible bilateral placement however this is not been scheduled.  He has some chronic LE swelling for which his orthopedic MD recommended compression stockings which he states has helped.  His right lower extremity is significantly larger than his left therefore we will proceed with lower extremity Doppler study to rule out DVT.  They also prescribed 20 mg Lasix p.o. daily however this is not his medication list.  We will add this.  He denies chest pain, shortness of breath, palpitations, dizziness or syncope.  His physical activity is limited secondary to his knee pain.  He does report some deconditioning.  Overall he is very stable.  BP is well controlled.   Past Medical History:  Diagnosis Date  . Anemia, iron deficiency 01/26/2011  . Arthritis   . Asthma   . Cancer (HCC)    nhl, colon ca  . Carotid artery occlusion    right occlusion of carotid  with moderate left plaque  . Coronary artery disease August 2005   MI with stent  . Diabetes mellitus   . Hyperlipidemia   . Hypertension   . Kidney stones 05/2010   lithotripsy  . Myocardial infarction [facility])     Past Surgical History:  Procedure Laterality Date  . CARPAL TUNNEL RELEASE Left 05/08/2015   Procedure: CARPAL TUNNEL RELEASE;  Surgeon: Charlotte Crumb, MD;  Location: [place];  Service: Orthopedics;  Laterality: Left;  . COLON SURGERY    . COLONOSCOPY    . CORONARY STENT PLACEMENT    . HERNIA REPAIR    . OPEN REDUCTION INTERNAL FIXATION (ORIF) DISTAL RADIAL FRACTURE Left 05/08/2015   Procedure: OPEN REDUCTION INTERNAL FIXATION (ORIF) DISTAL RADIAL FRACTURE, CARPAL TUNNEL RELEASE;  Surgeon: Charlotte Crumb, MD;  Location: [place];  Service: Orthopedics;  Laterality: Left;  . REPAIR OF COMPLEX TRACTION RETINAL DETACHMENT       Current Outpatient Medications  Medication Sig Dispense Refill  . aspirin 81 MG tablet Take 81 mg by mouth daily.      Marland Kitchen atorvastatin (LIPITOR) 40 MG tablet Take 40 mg by mouth daily.      . clopidogrel (PLAVIX) 75 MG tablet Take 75 mg by mouth daily.      . Coenzyme Q10 (CO Q 10 PO) Take 1 capsule by mouth daily.     . dorzolamide-timolol (COSOPT) 22.3-6.8 MG/ML ophthalmic solution Place  1 drop into both eyes 2 (two) times daily.    Marland Kitchen glucosamine-chondroitin 500-400 MG tablet Take 1 tablet by mouth daily.     Marland Kitchen losartan (COZAAR) 100 MG tablet Take 100 mg by mouth daily.    . Lutein 10 MG TABS Take 1 tablet by mouth daily.     Marland Kitchen LYCOPENE PO Take 1 tablet by mouth daily.     . metFORMIN (GLUCOPHAGE) 500 MG tablet Take 1,000 mg by mouth 2 (two) times daily with a meal.    . metoprolol (LOPRESSOR) 50 MG tablet Take 25 mg by mouth 2 (two) times daily.    . nitroGLYCERIN (NITROSTAT) 0.4 MG SL tablet Place 0.4 mg under the tongue every 5 (five) minutes x 3 doses as needed for chest pain.    . Omega-3 Fatty Acids (FISH OIL) 1000 MG CAPS Take 1 capsule by mouth  daily.    . pantoprazole (PROTONIX) 40 MG tablet Take 40 mg by mouth daily.     . sitaGLIPtin (JANUVIA) 100 MG tablet Take 100 mg by mouth daily.      . furosemide (LASIX) 20 MG tablet Take 1 tablet (20 mg total) by mouth daily. 90 tablet 3  . potassium chloride SA (K-DUR) 20 MEQ tablet Take 1 tablet (20 mEq total) by mouth daily. 90 tablet 3   No current facility-administered medications for this visit.     Allergies:   Iodine    Social History:  The patient  reports that he quit smoking about 38 years ago. He has a 20.00 pack-year smoking history. He has never used smokeless tobacco. He reports that he does not drink alcohol or use drugs.   Family History:  The patient's family history includes Cancer in his brother, father, maternal aunt, and mother; Healthy in his sister.    ROS:  Please see the history of present illness. Otherwise, review of systems are positive for none.   All other systems are reviewed and negative.    PHYSICAL EXAM: VS:  BP 120/62   Pulse 63   Ht 5\' 9"  (1.753 m)   Wt 244 lb 6.4 oz (110.9 kg)   SpO2 98%   BMI 36.09 kg/m  , BMI Body mass index is 36.09 kg/m.   General: Well developed, well nourished, NAD Lungs:Clear to ausculation bilaterally. No wheezes, rales, or rhonchi. Breathing is unlabored. Cardiovascular: RRR with S1 S2. No murmur MSK: Strength and tone appear normal for age. 5/5 in all extremities Extremities: Mild 1+ R/L LE edema R>L. No clubbing or cyanosis. DP pulses 1+ bilaterally Neuro: Alert and oriented. No focal deficits. No facial asymmetry. MAE spontaneously. Psych: Responds to questions appropriately with normal affect.     EKG:  EKG is ordered today. The ekg ordered today demonstrates NSR with HR 63bpm and no acute changes   Recent Labs: 08/26/2018: ALT 27; BUN 29; Creatinine 1.47; Hemoglobin 13.3; Platelets 175; Potassium 4.5; Sodium 138    Lipid Panel    Component Value Date/Time   CHOL 139 05/08/2005 1216   TRIG 97  05/08/2005 1216   HDL 31 (L) 05/08/2005 1216   CHOLHDL 4.5 05/08/2005 1216   VLDL 19 05/08/2005 1216   LDLCALC 89 05/08/2005 1216      Wt Readings from Last 3 Encounters:  09/29/18 244 lb 6.4 oz (110.9 kg)  08/26/18 241 lb 1.6 oz (109.4 kg)  02/04/18 252 lb 1.6 oz (114.4 kg)    Other studies Reviewed: Additional studies/ records that were reviewed today include:  Lexiscan Stress test 10/02/2017:    Nuclear stress EF: 67%.  There was no ST segment deviation noted during stress.  The study is normal.  This is a low risk study.  The left ventricular ejection fraction is hyperdynamic (>65%).   Normal resting and stress perfusion. No ischemia or infarction EF 67% but appears to be LVE   ASSESSMENT AND PLAN:  1. CAD with hx of CABG: -Had Lexiscan stress test performed last year in the setting of chest pain that was found to be normal without ischemia,  reassuring  -Denies chest pain  -Continue current regimen   2. HTN: -Stable, 120/62 -Was noted to have previously been on ACE and ARB>>lisinopril discontinued at last OV -No need for further medication titration   3. Bilateral carotid artery disease: -Continue with aggressive BP and HLD control to prevent further left CAD progression. Has known total occlusion of the right>>allow for some permissive HTN -No syncope    4. HLD: -Continue high intensity statin -Followed by PCP  5.  Lower extremity pain, edema: -Needs bilateral knee replacement per orthopedic team -Was recommended to wear lower extremity compression stockings which is helped with chronic swelling however right lower extremity is greater in diameter than left therefore will obtain right lower extremity venous Doppler study to rule out DVT>>low suspicion at this time -We will have him continue Lasix 20 mg daily for orthopedic recommendations -Obtain lab work today to assess creatinine -Was elevated at last lab draw 08/2018.  Patient reports poor water  intake.  We discussed increasing his fluid intake given the addition of potassium and K+ supplementation -Close follow-up 1 month   Current medicines are reviewed at length with the patient today.  The patient does not have concerns regarding medicines.  The following changes have been made: Lasix 20 mg daily, K-Dur 20 milliequivalent daily  Labs/ tests ordered today include: BMET, right lower extremity Doppler study to rule out DVT  Orders Placed This Encounter  Procedures  . Basic metabolic panel  . EKG 12-Lead  . VAS [place] LOWER EXTREMITY VENOUS (DVT)    Disposition:   FU with me in 1 month  Signed, Kathyrn Drown, NP  09/29/2018 10:47 AM    [facility] [place], [place], [place]  [address] Phone: [phone]; Fax: [phone]

## 2018-09-28 DIAGNOSIS — Z23 Encounter for immunization: Secondary | ICD-10-CM | POA: Diagnosis not present

## 2018-09-28 DIAGNOSIS — C189 Malignant neoplasm of colon, unspecified: Secondary | ICD-10-CM | POA: Diagnosis not present

## 2018-09-28 DIAGNOSIS — E782 Mixed hyperlipidemia: Secondary | ICD-10-CM | POA: Diagnosis not present

## 2018-09-28 DIAGNOSIS — I252 Old myocardial infarction: Secondary | ICD-10-CM | POA: Diagnosis not present

## 2018-09-28 DIAGNOSIS — E1142 Type 2 diabetes mellitus with diabetic polyneuropathy: Secondary | ICD-10-CM | POA: Diagnosis not present

## 2018-09-28 DIAGNOSIS — N183 Chronic kidney disease, stage 3 (moderate): Secondary | ICD-10-CM | POA: Diagnosis not present

## 2018-09-28 DIAGNOSIS — E119 Type 2 diabetes mellitus without complications: Secondary | ICD-10-CM | POA: Diagnosis not present

## 2018-09-28 DIAGNOSIS — I10 Essential (primary) hypertension: Secondary | ICD-10-CM | POA: Diagnosis not present

## 2018-09-28 DIAGNOSIS — I2510 Atherosclerotic heart disease of native coronary artery without angina pectoris: Secondary | ICD-10-CM | POA: Diagnosis not present

## 2018-09-28 DIAGNOSIS — Z7984 Long term (current) use of oral hypoglycemic drugs: Secondary | ICD-10-CM | POA: Diagnosis not present

## 2018-09-28 DIAGNOSIS — I739 Peripheral vascular disease, unspecified: Secondary | ICD-10-CM | POA: Diagnosis not present

## 2018-09-28 DIAGNOSIS — M1990 Unspecified osteoarthritis, unspecified site: Secondary | ICD-10-CM | POA: Diagnosis not present

## 2018-09-29 ENCOUNTER — Encounter (INDEPENDENT_AMBULATORY_CARE_PROVIDER_SITE_OTHER): Payer: Self-pay

## 2018-09-29 ENCOUNTER — Encounter: Payer: Self-pay | Admitting: Cardiology

## 2018-09-29 ENCOUNTER — Other Ambulatory Visit: Payer: Self-pay

## 2018-09-29 ENCOUNTER — Ambulatory Visit: Payer: PPO | Admitting: Cardiology

## 2018-09-29 VITALS — BP 120/62 | HR 63 | Ht 69.0 in | Wt 244.4 lb

## 2018-09-29 DIAGNOSIS — E782 Mixed hyperlipidemia: Secondary | ICD-10-CM

## 2018-09-29 DIAGNOSIS — I82401 Acute embolism and thrombosis of unspecified deep veins of right lower extremity: Secondary | ICD-10-CM

## 2018-09-29 DIAGNOSIS — I6523 Occlusion and stenosis of bilateral carotid arteries: Secondary | ICD-10-CM

## 2018-09-29 DIAGNOSIS — R609 Edema, unspecified: Secondary | ICD-10-CM | POA: Diagnosis not present

## 2018-09-29 DIAGNOSIS — I2510 Atherosclerotic heart disease of native coronary artery without angina pectoris: Secondary | ICD-10-CM | POA: Diagnosis not present

## 2018-09-29 DIAGNOSIS — I252 Old myocardial infarction: Secondary | ICD-10-CM

## 2018-09-29 LAB — BASIC METABOLIC PANEL
BUN/Creatinine Ratio: 15 (ref 10–24)
BUN: 21 mg/dL (ref 8–27)
CO2: 19 mmol/L — ABNORMAL LOW (ref 20–29)
Calcium: 9.5 mg/dL (ref 8.6–10.2)
Chloride: 108 mmol/L — ABNORMAL HIGH (ref 96–106)
Creatinine, Ser: 1.4 mg/dL — ABNORMAL HIGH (ref 0.76–1.27)
GFR calc Af Amer: 56 mL/min/{1.73_m2} — ABNORMAL LOW (ref >59)
GFR calc non Af Amer: 49 mL/min/{1.73_m2} — ABNORMAL LOW (ref >59)
Glucose: 148 mg/dL — ABNORMAL HIGH (ref 65–99)
Potassium: 5.1 mmol/L (ref 3.5–5.2)
Sodium: 140 mmol/L (ref 134–144)

## 2018-09-29 MED ORDER — POTASSIUM CHLORIDE CRYS ER 20 MEQ PO TBCR: 20.0000 meq | EXTENDED_RELEASE_TABLET | Freq: Every day | ORAL | 3 refills | Status: DC

## 2018-09-29 MED ORDER — FUROSEMIDE 20 MG PO TABS: 20.0000 mg | ORAL_TABLET | Freq: Every day | ORAL | 3 refills | Status: DC

## 2018-09-29 NOTE — Patient Instructions (Addendum)
Medication Instructions:  Your physician has recommended you make the following change in your medication:  1. START LASIX 20 MG DAILY.  2. START POTASSIUM 20 MEQ DAILY.  If you need a refill on your cardiac medications before your next appointment, please call your pharmacy.   Lab work: TODAY: BMET If you have labs (blood work) drawn today and your tests are completely normal, you will receive your results only by: Marland Kitchen MyChart Message (if you have MyChart) OR . A paper copy in the mail If you have any lab test that is abnormal or we need to change your treatment, we will call you to review the results.  Testing/Procedures: Your physician has requested that you have a lower extremity venous duplex. This test is an ultrasound of the veins in the legs or arms. It looks at venous blood flow that carries blood from the heart to the legs or arms. Allow one hour for a Lower Venous exam. Allow thirty minutes for an Upper Venous exam. There are no restrictions or special instructions.   Follow-Up: At [facility], you and your health needs are our priority.  As part of our continuing mission to provide you with exceptional heart care, we have created designated Provider Care Teams.  These Care Teams include your primary Cardiologist (physician) and Advanced Practice Providers (APPs -  Physician Assistants and Nurse Practitioners) who all work together to provide you with the care you need, when you need it. . Your physician recommends that you schedule a follow-up appointment in: [place], NP

## 2018-10-01 ENCOUNTER — Ambulatory Visit (HOSPITAL_COMMUNITY)
Admission: RE | Admit: 2018-10-01 | Discharge: 2018-10-01 | Disposition: A | Discharge status: Home / Self Care | Payer: PPO | Source: Ambulatory Visit | Attending: Cardiology | Admitting: Cardiology

## 2018-10-01 ENCOUNTER — Other Ambulatory Visit: Payer: Self-pay | Admitting: Cardiology

## 2018-10-01 ENCOUNTER — Other Ambulatory Visit: Payer: Self-pay

## 2018-10-01 DIAGNOSIS — M7989 Other specified soft tissue disorders: Secondary | ICD-10-CM | POA: Diagnosis not present

## 2018-10-07 ENCOUNTER — Telehealth: Payer: Self-pay

## 2018-10-07 NOTE — Telephone Encounter (Signed)
The patient has been notified of the result and verbalized understanding.  All questions (if any) were answered. Wilma Flavin, RN 10/07/2018 11:51 AM

## 2018-11-19 DIAGNOSIS — H6121 Impacted cerumen, right ear: Secondary | ICD-10-CM | POA: Diagnosis not present

## 2018-11-23 DIAGNOSIS — R05 Cough: Secondary | ICD-10-CM | POA: Diagnosis not present

## 2018-12-06 NOTE — Progress Notes (Signed)
Cardiology Office Note:    Date:  12/07/2018   ID:  [name], DOB November 17, 1940, MRN [id number]  PCP:  Wenda Low, MD  Cardiologist:  Sinclair Grooms, MD   Referring MD: Wenda Low, MD   Chief Complaint  Patient presents with  . Coronary Artery Disease    History of Present Illness:    Bohumil Kerkman. is a 78 y.o. male with a hx of CAD with stent 2005 (location unknown and no records available), hypertension, hyperlipidemia, right carotid occlusion, diastolic heart failure, and diabetes mellitus. Bilateral LE swelling --> f/u LE Doppler.  He returns today for follow-up of lower extremity swelling.  Both he and his wife had Covid greater than 3 weeks ago.  He is off the 14-day quarantine as of Saturday, November 28.  He has no cough.  He denies chills and fever.  He never had respiratory symptoms, loss of smell or taste, or other complaints.  Past Medical History:  Diagnosis Date  . Anemia, iron deficiency 01/26/2011  . Arthritis   . Asthma   . Cancer (HCC)    nhl, colon ca  . Carotid artery occlusion    right occlusion of carotid with moderate left plaque  . Coronary artery disease August 2005   MI with stent  . Diabetes mellitus   . Hyperlipidemia   . Hypertension   . Kidney stones 05/2010   lithotripsy  . Myocardial infarction [facility])     Past Surgical History:  Procedure Laterality Date  . CARPAL TUNNEL RELEASE Left 05/08/2015   Procedure: CARPAL TUNNEL RELEASE;  Surgeon: Charlotte Crumb, MD;  Location: [place];  Service: Orthopedics;  Laterality: Left;  . COLON SURGERY    . COLONOSCOPY    . CORONARY STENT PLACEMENT    . HERNIA REPAIR    . OPEN REDUCTION INTERNAL FIXATION (ORIF) DISTAL RADIAL FRACTURE Left 05/08/2015   Procedure: OPEN REDUCTION INTERNAL FIXATION (ORIF) DISTAL RADIAL FRACTURE, CARPAL TUNNEL RELEASE;  Surgeon: Charlotte Crumb, MD;  Location: [place];  Service: Orthopedics;  Laterality: Left;  . REPAIR OF COMPLEX TRACTION RETINAL  DETACHMENT      Current Medications: Current Meds  Medication Sig  . aspirin 81 MG tablet Take 81 mg by mouth daily.    Marland Kitchen atorvastatin (LIPITOR) 40 MG tablet Take 40 mg by mouth daily.    . clopidogrel (PLAVIX) 75 MG tablet Take 75 mg by mouth daily.    . Coenzyme Q10 (CO Q 10 PO) Take 1 capsule by mouth daily.   . dorzolamide-timolol (COSOPT) 22.3-6.8 MG/ML ophthalmic solution Place 1 drop into both eyes 2 (two) times daily.  . furosemide (LASIX) 20 MG tablet Take 1 tablet (20 mg total) by mouth daily.  Marland Kitchen glucosamine-chondroitin 500-400 MG tablet Take 1 tablet by mouth daily.   Marland Kitchen losartan (COZAAR) 100 MG tablet Take 100 mg by mouth daily.  . Lutein 10 MG TABS Take 1 tablet by mouth daily.   Marland Kitchen LYCOPENE PO Take 1 tablet by mouth daily.   . metFORMIN (GLUCOPHAGE) 500 MG tablet Take 1,000 mg by mouth 2 (two) times daily with a meal.  . metoprolol (LOPRESSOR) 50 MG tablet Take 25 mg by mouth 2 (two) times daily.  . nitroGLYCERIN (NITROSTAT) 0.4 MG SL tablet Place 0.4 mg under the tongue every 5 (five) minutes x 3 doses as needed for chest pain.  . Omega-3 Fatty Acids (FISH OIL) 1000 MG CAPS Take 1 capsule by mouth daily.  . pantoprazole (PROTONIX)  40 MG tablet Take 40 mg by mouth daily.   . potassium chloride SA (K-DUR) 20 MEQ tablet Take 1 tablet (20 mEq total) by mouth daily.  . sitaGLIPtin (JANUVIA) 100 MG tablet Take 100 mg by mouth daily.       Allergies:   Iodine   Social History   Socioeconomic History  . Marital status: Married    Spouse name: Not on file  . Number of children: Not on file  . Years of education: Not on file  . Highest education level: Not on file  Occupational History  . Not on file  Social Needs  . Financial resource strain: Not on file  . Food insecurity    Worry: Not on file    Inability: Not on file  . Transportation needs    Medical: Not on file    Non-medical: Not on file  Tobacco Use  . Smoking status: Former Smoker    Packs/day: 1.00     Years: 20.00    Pack years: 20.00    Quit date: 01/08/1980    Years since quitting: 38.9  . Smokeless tobacco: Never Used  Substance and Sexual Activity  . Alcohol use: No    Alcohol/week: 0.0 standard drinks  . Drug use: No  . Sexual activity: Not on file  Lifestyle  . Physical activity    Days per week: Not on file    Minutes per session: Not on file  . Stress: Not on file  Relationships  . Social Herbalist on phone: Not on file    Gets together: Not on file    Attends religious service: Not on file    Active member of club or organization: Not on file    Attends meetings of clubs or organizations: Not on file    Relationship status: Not on file  Other Topics Concern  . Not on file  Social History Narrative  . Not on file     Family History: The patient's family history includes Cancer in his brother, father, maternal aunt, and mother; Healthy in his sister.  ROS:   Please see the history of present illness.    He has not had his blood pressure medicine yet today.  He has discontinued furosemide and potassium.  His blood pressure is slightly elevated at 142/68 but without having had Lopressor or Cozaar today so far.  All other systems reviewed and are negative.  EKGs/Labs/Other Studies Reviewed:    The following studies were reviewed today:  LE venous Doppler 09/29/18 Summary: Right: No evidence of deep vein thrombosis in the lower extremity. No indirect evidence of obstruction proximal to the inguinal ligament. No cystic structure found in the popliteal fossa. Difficulty visualizing the peroneal veins due to increase lower  leg swelling. Specifically, the popliteal vein and tibioperoneal confluence are without thrombus.  Left: No evidence of common femoral vein obstruction.  Myocardial Perfusion Imaging 2019: Study Highlights    Nuclear stress EF: 67%.  There was no ST segment deviation noted during stress.  The study is normal.  This is a low  risk study.  The left ventricular ejection fraction is hyperdynamic (>65%).   Normal resting and stress perfusion. No ischemia or infarction EF 67% but appears to be LVE     EKG:  EKG no new tracing  Recent Labs: 08/26/2018: ALT 27; Hemoglobin 13.3; Platelets 175 09/29/2018: BUN 21; Creatinine, Ser 1.40; Potassium 5.1; Sodium 140  Recent Lipid Panel    Component  Value Date/Time   CHOL 139 05/08/2005 1216   TRIG 97 05/08/2005 1216   HDL 31 (L) 05/08/2005 1216   CHOLHDL 4.5 05/08/2005 1216   VLDL 19 05/08/2005 1216   LDLCALC 89 05/08/2005 1216    Physical Exam:    VS:  BP (!) 142/68   Pulse 71   Ht 5\' 9"  (1.753 m)   Wt 235 lb 1.9 oz (106.6 kg)   SpO2 97%   BMI 34.72 kg/m     Wt Readings from Last 3 Encounters:  12/07/18 235 lb 1.9 oz (106.6 kg)  09/29/18 244 lb 6.4 oz (110.9 kg)  08/26/18 241 lb 1.6 oz (109.4 kg)     GEN: Obese. No acute distress HEENT: Normal NECK: No JVD. LYMPHATICS: No lymphadenopathy CARDIAC:  RRR without murmur, gallop, or edema. VASCULAR:  Normal Pulses. No bruits. RESPIRATORY:  Clear to auscultation without rales, wheezing or rhonchi  ABDOMEN: Soft, non-tender, non-distended, No pulsatile mass, MUSCULOSKELETAL: No deformity  SKIN: Warm and dry NEUROLOGIC:  Alert and oriented x 3 PSYCHIATRIC:  Normal affect   ASSESSMENT:    1. Coronary artery disease with hx of myocardial infarct w/o hx of CABG   2. Mixed hyperlipidemia   3. Essential hypertension, benign   4. Carotid stenosis, bilateral   5. Educated about COVID-19 virus infection    PLAN:    In order of problems listed above:  1. Secondary prevention discussed 2. LDL target less than 70 with most recent being 53 3. Little elevated this morning.  Has not yet had his antihypertensive therapy.  He discontinued furosemide and potassium.  He will have blood work done with Dr. Deforest Hoyles within the next 2 weeks.  I urged him to take his medication prior to his office visit with Deforest Hoyles  to determine if control is reasonable.  I also strongly encouraged that he continues to wear support stockings. 4. Secondary prevention is discussed. 5. He has had COVID-19 infection but I urged him to continue to observe the 3W's.  Overall education and awareness concerning primary/secondary risk prevention was discussed in detail: LDL less than 70, hemoglobin A1c less than 7, blood pressure target less than 130/80 mmHg, >150 minutes of moderate aerobic activity per week, avoidance of smoking, weight control (via diet and exercise), and continued surveillance/management of/for obstructive sleep apnea.    Medication Adjustments/Labs and Tests Ordered: Current medicines are reviewed at length with the patient today.  Concerns regarding medicines are outlined above.  No orders of the defined types were placed in this encounter.  No orders of the defined types were placed in this encounter.   There are no Patient Instructions on file for this visit.   Signed, Sinclair Grooms, MD  12/07/2018 8:41 AM    [place]

## 2018-12-07 ENCOUNTER — Encounter: Payer: Self-pay | Admitting: Interventional Cardiology

## 2018-12-07 ENCOUNTER — Ambulatory Visit: Payer: PPO | Admitting: Interventional Cardiology

## 2018-12-07 ENCOUNTER — Encounter (INDEPENDENT_AMBULATORY_CARE_PROVIDER_SITE_OTHER): Payer: Self-pay

## 2018-12-07 ENCOUNTER — Other Ambulatory Visit: Payer: Self-pay

## 2018-12-07 VITALS — BP 142/68 | HR 71 | Ht 69.0 in | Wt 235.1 lb

## 2018-12-07 DIAGNOSIS — I110 Hypertensive heart disease with heart failure: Secondary | ICD-10-CM | POA: Diagnosis not present

## 2018-12-07 DIAGNOSIS — I5030 Unspecified diastolic (congestive) heart failure: Secondary | ICD-10-CM

## 2018-12-07 DIAGNOSIS — Z955 Presence of coronary angioplasty implant and graft: Secondary | ICD-10-CM | POA: Diagnosis not present

## 2018-12-07 DIAGNOSIS — I252 Old myocardial infarction: Secondary | ICD-10-CM

## 2018-12-07 DIAGNOSIS — Z8619 Personal history of other infectious and parasitic diseases: Secondary | ICD-10-CM | POA: Diagnosis not present

## 2018-12-07 DIAGNOSIS — E782 Mixed hyperlipidemia: Secondary | ICD-10-CM | POA: Diagnosis not present

## 2018-12-07 DIAGNOSIS — I2510 Atherosclerotic heart disease of native coronary artery without angina pectoris: Secondary | ICD-10-CM | POA: Diagnosis not present

## 2018-12-07 DIAGNOSIS — M7989 Other specified soft tissue disorders: Secondary | ICD-10-CM

## 2018-12-07 DIAGNOSIS — E119 Type 2 diabetes mellitus without complications: Secondary | ICD-10-CM

## 2018-12-07 DIAGNOSIS — I6523 Occlusion and stenosis of bilateral carotid arteries: Secondary | ICD-10-CM

## 2018-12-07 DIAGNOSIS — I10 Essential (primary) hypertension: Secondary | ICD-10-CM

## 2018-12-07 DIAGNOSIS — Z7189 Other specified counseling: Secondary | ICD-10-CM

## 2018-12-07 DIAGNOSIS — Z87891 Personal history of nicotine dependence: Secondary | ICD-10-CM | POA: Diagnosis not present

## 2018-12-07 NOTE — Patient Instructions (Signed)

## 2018-12-14 DIAGNOSIS — Z125 Encounter for screening for malignant neoplasm of prostate: Secondary | ICD-10-CM | POA: Diagnosis not present

## 2018-12-14 DIAGNOSIS — I10 Essential (primary) hypertension: Secondary | ICD-10-CM | POA: Diagnosis not present

## 2018-12-14 DIAGNOSIS — E1142 Type 2 diabetes mellitus with diabetic polyneuropathy: Secondary | ICD-10-CM | POA: Diagnosis not present

## 2018-12-14 DIAGNOSIS — Z1389 Encounter for screening for other disorder: Secondary | ICD-10-CM | POA: Diagnosis not present

## 2018-12-14 DIAGNOSIS — E1165 Type 2 diabetes mellitus with hyperglycemia: Secondary | ICD-10-CM | POA: Diagnosis not present

## 2018-12-14 DIAGNOSIS — I252 Old myocardial infarction: Secondary | ICD-10-CM | POA: Diagnosis not present

## 2018-12-14 DIAGNOSIS — I2510 Atherosclerotic heart disease of native coronary artery without angina pectoris: Secondary | ICD-10-CM | POA: Diagnosis not present

## 2018-12-14 DIAGNOSIS — Z0000 Encounter for general adult medical examination without abnormal findings: Secondary | ICD-10-CM | POA: Diagnosis not present

## 2018-12-14 DIAGNOSIS — N1831 Chronic kidney disease, stage 3a: Secondary | ICD-10-CM | POA: Diagnosis not present

## 2018-12-14 DIAGNOSIS — Z85038 Personal history of other malignant neoplasm of large intestine: Secondary | ICD-10-CM | POA: Diagnosis not present

## 2018-12-14 DIAGNOSIS — K219 Gastro-esophageal reflux disease without esophagitis: Secondary | ICD-10-CM | POA: Diagnosis not present

## 2018-12-14 DIAGNOSIS — E782 Mixed hyperlipidemia: Secondary | ICD-10-CM | POA: Diagnosis not present

## 2018-12-14 DIAGNOSIS — I739 Peripheral vascular disease, unspecified: Secondary | ICD-10-CM | POA: Diagnosis not present

## 2019-01-04 DIAGNOSIS — H401131 Primary open-angle glaucoma, bilateral, mild stage: Secondary | ICD-10-CM | POA: Diagnosis not present

## 2019-01-14 DIAGNOSIS — E782 Mixed hyperlipidemia: Secondary | ICD-10-CM | POA: Diagnosis not present

## 2019-01-14 DIAGNOSIS — M1990 Unspecified osteoarthritis, unspecified site: Secondary | ICD-10-CM | POA: Diagnosis not present

## 2019-01-14 DIAGNOSIS — N400 Enlarged prostate without lower urinary tract symptoms: Secondary | ICD-10-CM | POA: Diagnosis not present

## 2019-01-14 DIAGNOSIS — Z85038 Personal history of other malignant neoplasm of large intestine: Secondary | ICD-10-CM | POA: Diagnosis not present

## 2019-01-14 DIAGNOSIS — I2510 Atherosclerotic heart disease of native coronary artery without angina pectoris: Secondary | ICD-10-CM | POA: Diagnosis not present

## 2019-01-14 DIAGNOSIS — E1142 Type 2 diabetes mellitus with diabetic polyneuropathy: Secondary | ICD-10-CM | POA: Diagnosis not present

## 2019-01-14 DIAGNOSIS — C189 Malignant neoplasm of colon, unspecified: Secondary | ICD-10-CM | POA: Diagnosis not present

## 2019-01-14 DIAGNOSIS — H409 Unspecified glaucoma: Secondary | ICD-10-CM | POA: Diagnosis not present

## 2019-01-14 DIAGNOSIS — N1831 Chronic kidney disease, stage 3a: Secondary | ICD-10-CM | POA: Diagnosis not present

## 2019-01-14 DIAGNOSIS — I10 Essential (primary) hypertension: Secondary | ICD-10-CM | POA: Diagnosis not present

## 2019-01-14 DIAGNOSIS — I252 Old myocardial infarction: Secondary | ICD-10-CM | POA: Diagnosis not present

## 2019-01-26 DIAGNOSIS — E113292 Type 2 diabetes mellitus with mild nonproliferative diabetic retinopathy without macular edema, left eye: Secondary | ICD-10-CM | POA: Diagnosis not present

## 2019-01-26 DIAGNOSIS — H2521 Age-related cataract, morgagnian type, right eye: Secondary | ICD-10-CM | POA: Diagnosis not present

## 2019-01-26 DIAGNOSIS — H353122 Nonexudative age-related macular degeneration, left eye, intermediate dry stage: Secondary | ICD-10-CM | POA: Diagnosis not present

## 2019-02-22 DIAGNOSIS — E1142 Type 2 diabetes mellitus with diabetic polyneuropathy: Secondary | ICD-10-CM | POA: Diagnosis not present

## 2019-02-22 DIAGNOSIS — N400 Enlarged prostate without lower urinary tract symptoms: Secondary | ICD-10-CM | POA: Diagnosis not present

## 2019-02-22 DIAGNOSIS — Z85038 Personal history of other malignant neoplasm of large intestine: Secondary | ICD-10-CM | POA: Diagnosis not present

## 2019-02-22 DIAGNOSIS — E782 Mixed hyperlipidemia: Secondary | ICD-10-CM | POA: Diagnosis not present

## 2019-02-22 DIAGNOSIS — I252 Old myocardial infarction: Secondary | ICD-10-CM | POA: Diagnosis not present

## 2019-02-22 DIAGNOSIS — M1990 Unspecified osteoarthritis, unspecified site: Secondary | ICD-10-CM | POA: Diagnosis not present

## 2019-02-22 DIAGNOSIS — N183 Chronic kidney disease, stage 3 unspecified: Secondary | ICD-10-CM | POA: Diagnosis not present

## 2019-02-22 DIAGNOSIS — H409 Unspecified glaucoma: Secondary | ICD-10-CM | POA: Diagnosis not present

## 2019-02-22 DIAGNOSIS — C189 Malignant neoplasm of colon, unspecified: Secondary | ICD-10-CM | POA: Diagnosis not present

## 2019-02-22 DIAGNOSIS — I10 Essential (primary) hypertension: Secondary | ICD-10-CM | POA: Diagnosis not present

## 2019-02-22 DIAGNOSIS — I2510 Atherosclerotic heart disease of native coronary artery without angina pectoris: Secondary | ICD-10-CM | POA: Diagnosis not present

## 2019-02-25 ENCOUNTER — Telehealth: Payer: Self-pay | Admitting: Hematology

## 2019-02-25 NOTE — Telephone Encounter (Signed)
I talk with patient regarding reschedule °

## 2019-03-01 ENCOUNTER — Other Ambulatory Visit: Payer: PPO

## 2019-03-01 ENCOUNTER — Ambulatory Visit: Payer: PPO | Admitting: Hematology

## 2019-03-29 ENCOUNTER — Other Ambulatory Visit: Payer: PPO

## 2019-03-29 ENCOUNTER — Ambulatory Visit: Payer: PPO | Admitting: Hematology

## 2019-03-29 DIAGNOSIS — I2510 Atherosclerotic heart disease of native coronary artery without angina pectoris: Secondary | ICD-10-CM | POA: Diagnosis not present

## 2019-03-29 DIAGNOSIS — N1831 Chronic kidney disease, stage 3a: Secondary | ICD-10-CM | POA: Diagnosis not present

## 2019-03-29 DIAGNOSIS — E782 Mixed hyperlipidemia: Secondary | ICD-10-CM | POA: Diagnosis not present

## 2019-03-29 DIAGNOSIS — E1142 Type 2 diabetes mellitus with diabetic polyneuropathy: Secondary | ICD-10-CM | POA: Diagnosis not present

## 2019-03-29 DIAGNOSIS — Z85038 Personal history of other malignant neoplasm of large intestine: Secondary | ICD-10-CM | POA: Diagnosis not present

## 2019-03-29 DIAGNOSIS — M1990 Unspecified osteoarthritis, unspecified site: Secondary | ICD-10-CM | POA: Diagnosis not present

## 2019-03-29 DIAGNOSIS — C189 Malignant neoplasm of colon, unspecified: Secondary | ICD-10-CM | POA: Diagnosis not present

## 2019-03-29 DIAGNOSIS — N400 Enlarged prostate without lower urinary tract symptoms: Secondary | ICD-10-CM | POA: Diagnosis not present

## 2019-03-29 DIAGNOSIS — I252 Old myocardial infarction: Secondary | ICD-10-CM | POA: Diagnosis not present

## 2019-03-29 DIAGNOSIS — H409 Unspecified glaucoma: Secondary | ICD-10-CM | POA: Diagnosis not present

## 2019-03-29 DIAGNOSIS — I10 Essential (primary) hypertension: Secondary | ICD-10-CM | POA: Diagnosis not present

## 2019-04-11 NOTE — Progress Notes (Signed)
[facility]  HEMATOLOGY ONCOLOGY PROGRESS NOTE  Date of service: 04/12/19    Patient Care Team: Wenda Low, MD as PCP - General (Internal Medicine) Belva Crome, MD as PCP - Cardiology (Cardiology)  CC:  f/u for  history of colon cancer  low grade follicular NHL    INTERVAL HISTORY: Mr Mark West" Arras is here for follow-up for his history of colon cancer and low-grade follicular non-Hodgkin's lymphoma which has not required treatment. He has also had history of previous iron deficiency anemia thought to be from colonic polyps. The patient's last visit with [place] was on 08/26/2018. The pt reports that he is doing well overall.  The pt reports that he had a COVID19 infection and had mild symptoms, mostly coughing. He lost some weight due to his COVID19 infection, but has gained his weight back. Pt has also been having vertigo and some increased bruising lately. He has had vertigo in the past and Meclizine did not help with his symptoms. He has also seen an ENT for this issue.   He believes that his Diabetes has been stable but does not check his blood glucose levels at home. Pt takes Januvia and Metformin to help control his blood glucose levels.   Lab results today (04/12/19) of CBC w/diff and CMP is as follows: all values are WNL except for Hgb at 12.6, CO2 at 20, Glucose at 156, Creatinine at 1.31, Calcium at 8.8, Total Protein at 6.4, GFR Est Non Af Am at 53. 04/12/2019 LDH at 130 04/12/2019 CEA at 2.81 04/12/2019 Ferritin at 61  On review of systems, pt reports dizziness, bruising and denies bloody/black stools, unexpected weight loss, diarrhea, constipation, back pain, abdominal pain, testicular pain/swelling and any other symptoms.   REVIEW OF SYSTEMS:   A 10+ POINT REVIEW OF SYSTEMS WAS OBTAINED including neurology, dermatology, psychiatry, cardiac, respiratory, lymph, extremities, GI, GU, Musculoskeletal, constitutional, breasts, reproductive, HEENT.  All pertinent positives  are noted in the HPI.  All others are negative.   . Past Medical History:  Diagnosis Date  . Anemia, iron deficiency 01/26/2011  . Arthritis   . Asthma   . Cancer (HCC)    nhl, colon ca  . Carotid artery occlusion    right occlusion of carotid with moderate left plaque  . Coronary artery disease August 2005   MI with stent  . Diabetes mellitus   . Hyperlipidemia   . Hypertension   . Kidney stones 05/2010   lithotripsy  . Myocardial infarction ([place])     . Past Surgical History:  Procedure Laterality Date  . CARPAL TUNNEL RELEASE Left 05/08/2015   Procedure: CARPAL TUNNEL RELEASE;  Surgeon: Charlotte Crumb, MD;  Location: [facility];  Service: Orthopedics;  Laterality: Left;  . COLON SURGERY    . COLONOSCOPY    . CORONARY STENT PLACEMENT    . HERNIA REPAIR    . OPEN REDUCTION INTERNAL FIXATION (ORIF) DISTAL RADIAL FRACTURE Left 05/08/2015   Procedure: OPEN REDUCTION INTERNAL FIXATION (ORIF) DISTAL RADIAL FRACTURE, CARPAL TUNNEL RELEASE;  Surgeon: Charlotte Crumb, MD;  Location: [facility];  Service: Orthopedics;  Laterality: Left;  . REPAIR OF COMPLEX TRACTION RETINAL DETACHMENT      . Social History   Tobacco Use  . Smoking status: Former Smoker    Packs/day: 1.00    Years: 20.00    Pack years: 20.00    Quit date: 01/08/1980    Years since quitting: 39.2  . Smokeless tobacco: Never Used  Substance Use Topics  .  Alcohol use: No    Alcohol/week: 0.0 standard drinks  . Drug use: No    ALLERGIES:  is allergic to iodine.  MEDICATIONS:  Current Outpatient Medications  Medication Sig Dispense Refill  . Ascorbic Acid (VITAMIN C PO) Take 1 tablet by mouth daily.    Marland Kitchen aspirin 81 MG tablet Take 81 mg by mouth daily.      Marland Kitchen atorvastatin (LIPITOR) 40 MG tablet Take 40 mg by mouth daily.      . clopidogrel (PLAVIX) 75 MG tablet Take 75 mg by mouth daily.      . Coenzyme Q10 (CO Q 10 PO) Take 1 capsule by mouth daily.     . dorzolamide-timolol (COSOPT) 22.3-6.8 MG/ML ophthalmic  solution Place 1 drop into both eyes 2 (two) times daily.    Marland Kitchen glucosamine-chondroitin 500-400 MG tablet Take 1 tablet by mouth daily.     Marland Kitchen losartan (COZAAR) 100 MG tablet Take 100 mg by mouth daily.    . Lutein 10 MG TABS Take 1 tablet by mouth daily.     Marland Kitchen LYCOPENE PO Take 1 tablet by mouth daily.     . metFORMIN (GLUCOPHAGE) 500 MG tablet Take 1,000 mg by mouth 2 (two) times daily with a meal.    . metoprolol (LOPRESSOR) 50 MG tablet Take 25 mg by mouth 2 (two) times daily.    . Multiple Vitamins-Minerals (ZINC PO) Take 1 tablet by mouth daily.    . nitroGLYCERIN (NITROSTAT) 0.4 MG SL tablet Place 0.4 mg under the tongue every 5 (five) minutes x 3 doses as needed for chest pain.    . Omega-3 Fatty Acids (FISH OIL) 1000 MG CAPS Take 1 capsule by mouth daily.    . pantoprazole (PROTONIX) 40 MG tablet Take 40 mg by mouth daily.     . sitaGLIPtin (JANUVIA) 100 MG tablet Take 100 mg by mouth daily.      Marland Kitchen VITAMIN D PO Take 1 tablet by mouth daily.     No current facility-administered medications for this visit.    PHYSICAL EXAMINATION: ECOG PERFORMANCE STATUS: 2 - Symptomatic, <50% confined to bed  Vitals:   04/12/19 1016  BP: (!) 156/65  Pulse: (!) 57  Resp: 18  Temp: 98.2 F (36.8 C)  SpO2: 99%    Filed Weights   04/12/19 1016  Weight: 241 lb 4.8 oz (109.5 kg)   .Body mass index is 35.63 kg/m.   GENERAL:alert, in no acute distress and comfortable SKIN: no acute rashes, no significant lesions EYES: conjunctiva are pink and non-injected, sclera anicteric OROPHARYNX: MMM, no exudates, no oropharyngeal erythema or ulceration NECK: supple, no JVD LYMPH:  no palpable lymphadenopathy in the cervical, axillary or inguinal regions LUNGS: clear to auscultation b/l with normal respiratory effort HEART: regular rate & rhythm ABDOMEN:  normoactive bowel sounds , non tender, not distended. No palpable hepatosplenomegaly.  Extremity: trace pedal edema PSYCH: alert & oriented x 3 with  fluent speech NEURO: no focal motor/sensory deficits  LABORATORY DATA:   I have reviewed the data as listed  . CBC Latest Ref Rng & Units 04/12/2019 08/26/2018 02/02/2018  WBC 4.0 - 10.5 K/uL 5.7 6.5 5.6  Hemoglobin 13.0 - 17.0 g/dL 12.6(L) 13.3 12.7(L)  Hematocrit 39.0 - 52.0 % 39.7 41.5 39.7  Platelets 150 - 400 K/uL 166 175 160    CBC    Component Value Date/Time   WBC 5.7 04/12/2019 0938   RBC 4.30 04/12/2019 0938   HGB 12.6 (L) 04/12/2019 [id number]  HGB 13.2 08/15/2017 0947   HGB 10.9 (L) 12/26/2016 1400   HCT 39.7 04/12/2019 0938   HCT 35.0 (L) 12/26/2016 1400   PLT 166 04/12/2019 0938   PLT 137 (L) 08/15/2017 0947   PLT 196 12/26/2016 1400   MCV 92.3 04/12/2019 0938   MCV 91.1 12/26/2016 1400   MCH 29.3 04/12/2019 0938   MCHC 31.7 04/12/2019 0938   RDW 13.1 04/12/2019 0938   RDW 14.6 12/26/2016 1400   LYMPHSABS 1.3 04/12/2019 0938   LYMPHSABS 1.5 12/26/2016 1400   MONOABS 0.5 04/12/2019 0938   MONOABS 0.4 12/26/2016 1400   EOSABS 0.1 04/12/2019 0938   EOSABS 0.1 12/26/2016 1400   BASOSABS 0.0 04/12/2019 0938   BASOSABS 0.0 12/26/2016 1400    . CMP Latest Ref Rng & Units 04/12/2019 09/29/2018 08/26/2018  Glucose 70 - 99 mg/dL 156(H) 148(H) 171(H)  BUN 8 - 23 mg/dL 18 21 29(H)  Creatinine 0.61 - 1.24 mg/dL 1.31(H) 1.40(H) 1.47(H)  Sodium 135 - 145 mmol/L 142 140 138  Potassium 3.5 - 5.1 mmol/L 4.7 5.1 4.5  Chloride 98 - 111 mmol/L 111 108(H) 109  CO2 22 - 32 mmol/L 20(L) 19(L) 18(L)  Calcium 8.9 - 10.3 mg/dL 8.8(L) 9.5 8.9  Total Protein 6.5 - 8.1 g/dL 6.4(L) - 6.9  Total Bilirubin 0.3 - 1.2 mg/dL 0.4 - 0.4  Alkaline Phos 38 - 126 U/L 50 - 50  AST 15 - 41 U/L 18 - 18  ALT 0 - 44 U/L 25 - 27   . Lab Results  Component Value Date   IRON 41 (L) 12/28/2015   TIBC 334 12/28/2015   IRONPCTSAT 12 (L) 12/28/2015   (Iron and TIBC)  Lab Results  Component Value Date   FERRITIN 61 04/12/2019    Lab Results  Component Value Date   LDH 130 04/12/2019     RADIOGRAPHIC STUDIES: I have personally reviewed the radiological images as listed and agreed with the findings in the report.  Ct chest/abdpelvis12/2018: IMPRESSION: 1. Status post right hemicolectomy with circumferential wall thickening in the neo terminal ileum extending into the ileocolic anastomosis and having asymmetric wall thickening in the proximal post anastomotic colon. There is pericolonic edema/inflammation tracking down the remaining segment of the colon to the rectum with interval increase in size of previously seen mesenteric lymph nodes adjacent to the anastomosis and interval development of multiple tiny perirectal nodules. These imaging features may be related to inflammatory bowel disease although infectious etiology neoplasm could have this appearance. 2. Apparent wall thickening in the rectum. 3. Potential pedunculated polyp in the neo terminal ileum. 4.  Aortic Atherosclerois (ICD10-170.0) 5. Multiple tiny pulmonary nodules in the lower lungs. Many of these were seen on the previous abdomen and pelvis CT and are stable in the interval. One particular 3 mm nodule in the left lower lobe was not included on the prior study and attention to this on follow-up imaging is recommended.  No results found.    ASSESSMENT & PLAN:   1.Follicular NHL Patient has never required treatment for this.  no obvious progression by CT [facility] 11-2015 -CT abd 11/27/2016 findings were likely related to his E Coli colonic infection and has clinically resolved with levofloxacin rx from our clinic. . Lab Results  Component Value Date   LDH 130 04/12/2019   2. Iron deficiency anemia previously related to GI bleeding from nonmalignant inflammatory anastomotic colon polyp.  Iron deficiency persistent. S/p IV injectafer x 2 injan-feb 2019. hgb has  improved from 11 to 13  Lab Results  Component Value Date   FERRITIN 61 04/12/2019    3.remote history of synchronous colon  cancers. Colonoscopy planned 12/08-2016 by Dr Amedeo Plenty, has been performed. We do not have records yet, however, the patient reported that Dr Amedeo Plenty felt positive about his results. We will follow up on this.   CT abd -- showed inflammation due to E.coli  02/02/18 CT C/A/P which revealed Status post right hemicolectomy. No findings suspicious for recurrent tumor. 2. Stable scattered bilateral pulmonary nodules but no new or progressive findings. 3. No findings for lymphadenopathy involving the chest, abdomen or pelvis. 4. Stable atherosclerotic calcifications involving the thoracic and abdominal aorta and branch vessels.  CEA 2.72 on 08/26/2018     PLAN: -Discussed pt labwork today, 04/12/19; all values are WNL except for Hgb at 12.6, CO2 at 20, Glucose at 156, Creatinine at 1.31, Calcium at 8.8, Total Protein at 6.4, GFR Est Non Af Am at 53. -Discussed 04/12/2019 LDH at 130 -Discussed 04/12/2019 CEA at 2.81 -Discussed 04/12/2019 Ferritin at 61 -Patient has no clinical or lab evidence of colon cancer or lymphoma recurrence/progression at this time. -Due to disease stability, will move follow ups to every 12 months  -Advised pt that Aspirin and Plavix will increase the risk of bruising  -Recommend pt keep skin moisturized and wear a compression sleeve to improve upper extremity bruising -Advised pt that controlling his Diabetes help prevent worsening CKD -Recommend pt receive the COVID19 vaccine when available  -Advised pt of avenues to obtain the COVID19 vaccine -Recommend pt f/u with PCP for Diabetes management -Will see back in 12 months with labs   FOLLOW UP: RTC with Dr Irene Limbo with labs in 12 months    The total time spent in the appt was 20 minutes and more than 50% was on counseling and direct patient cares.  All of the patient's questions were answered with apparent satisfaction. The patient knows to call the clinic with any problems, questions or concerns.    Sullivan Lone MD [place]  AAHIVMS [facility] Hematology/Oncology Physician [facility]  (Office):       [phone] (Work cell):  [phone] (Fax):           [phone]   I, Yevette Edwards, am acting as a scribe for Dr. Sullivan Lone.   .I have reviewed the above documentation for accuracy and completeness, and I agree with the above. Brunetta Genera MD

## 2019-04-12 ENCOUNTER — Inpatient Hospital Stay (HOSPITAL_BASED_OUTPATIENT_CLINIC_OR_DEPARTMENT_OTHER): Payer: PPO | Admitting: Hematology

## 2019-04-12 ENCOUNTER — Inpatient Hospital Stay: Payer: PPO | Attending: Hematology

## 2019-04-12 ENCOUNTER — Other Ambulatory Visit: Payer: Self-pay

## 2019-04-12 VITALS — BP 156/65 | HR 57 | Temp 98.2°F | Resp 18 | Ht 69.0 in | Wt 241.3 lb

## 2019-04-12 DIAGNOSIS — R918 Other nonspecific abnormal finding of lung field: Secondary | ICD-10-CM | POA: Insufficient documentation

## 2019-04-12 DIAGNOSIS — Z87891 Personal history of nicotine dependence: Secondary | ICD-10-CM | POA: Insufficient documentation

## 2019-04-12 DIAGNOSIS — D509 Iron deficiency anemia, unspecified: Secondary | ICD-10-CM | POA: Insufficient documentation

## 2019-04-12 DIAGNOSIS — C8203 Follicular lymphoma grade I, intra-abdominal lymph nodes: Secondary | ICD-10-CM | POA: Diagnosis not present

## 2019-04-12 DIAGNOSIS — E119 Type 2 diabetes mellitus without complications: Secondary | ICD-10-CM | POA: Diagnosis not present

## 2019-04-12 DIAGNOSIS — Z9049 Acquired absence of other specified parts of digestive tract: Secondary | ICD-10-CM | POA: Insufficient documentation

## 2019-04-12 DIAGNOSIS — Z85038 Personal history of other malignant neoplasm of large intestine: Secondary | ICD-10-CM | POA: Diagnosis not present

## 2019-04-12 DIAGNOSIS — Z7984 Long term (current) use of oral hypoglycemic drugs: Secondary | ICD-10-CM | POA: Diagnosis not present

## 2019-04-12 DIAGNOSIS — C8290 Follicular lymphoma, unspecified, unspecified site: Secondary | ICD-10-CM | POA: Insufficient documentation

## 2019-04-12 DIAGNOSIS — R42 Dizziness and giddiness: Secondary | ICD-10-CM | POA: Insufficient documentation

## 2019-04-12 DIAGNOSIS — Z86010 Personal history of colonic polyps: Secondary | ICD-10-CM | POA: Insufficient documentation

## 2019-04-12 DIAGNOSIS — Z8616 Personal history of COVID-19: Secondary | ICD-10-CM | POA: Insufficient documentation

## 2019-04-12 LAB — CBC WITH DIFFERENTIAL/PLATELET
Abs Immature Granulocytes: 0.01 10*3/uL (ref 0.00–0.07)
Basophils Absolute: 0 10*3/uL (ref 0.0–0.1)
Basophils Relative: 1 %
Eosinophils Absolute: 0.1 10*3/uL (ref 0.0–0.5)
Eosinophils Relative: 1 %
HCT: 39.7 % (ref 39.0–52.0)
Hemoglobin: 12.6 g/dL — ABNORMAL LOW (ref 13.0–17.0)
Immature Granulocytes: 0 %
Lymphocytes Relative: 23 %
Lymphs Abs: 1.3 10*3/uL (ref 0.7–4.0)
MCH: 29.3 pg (ref 26.0–34.0)
MCHC: 31.7 g/dL (ref 30.0–36.0)
MCV: 92.3 fL (ref 80.0–100.0)
Monocytes Absolute: 0.5 10*3/uL (ref 0.1–1.0)
Monocytes Relative: 9 %
Neutro Abs: 3.8 10*3/uL (ref 1.7–7.7)
Neutrophils Relative %: 66 %
Platelets: 166 10*3/uL (ref 150–400)
RBC: 4.3 MIL/uL (ref 4.22–5.81)
RDW: 13.1 % (ref 11.5–15.5)
WBC: 5.7 10*3/uL (ref 4.0–10.5)
nRBC: 0 % (ref 0.0–0.2)

## 2019-04-12 LAB — CMP (CANCER CENTER ONLY)
ALT: 25 U/L (ref 0–44)
AST: 18 U/L (ref 15–41)
Albumin: 3.5 g/dL (ref 3.5–5.0)
Alkaline Phosphatase: 50 U/L (ref 38–126)
Anion gap: 11 (ref 5–15)
BUN: 18 mg/dL (ref 8–23)
CO2: 20 mmol/L — ABNORMAL LOW (ref 22–32)
Calcium: 8.8 mg/dL — ABNORMAL LOW (ref 8.9–10.3)
Chloride: 111 mmol/L (ref 98–111)
Creatinine: 1.31 mg/dL — ABNORMAL HIGH (ref 0.61–1.24)
GFR, Est AFR Am: >60 mL/min (ref >60)
GFR, Estimated: 53 mL/min — ABNORMAL LOW (ref >60)
Glucose, Bld: 156 mg/dL — ABNORMAL HIGH (ref 70–99)
Potassium: 4.7 mmol/L (ref 3.5–5.1)
Sodium: 142 mmol/L (ref 135–145)
Total Bilirubin: 0.4 mg/dL (ref 0.3–1.2)
Total Protein: 6.4 g/dL — ABNORMAL LOW (ref 6.5–8.1)

## 2019-04-12 LAB — FERRITIN: Ferritin: 61 ng/mL (ref 24–336)

## 2019-04-12 LAB — CEA (IN HOUSE-CHCC): CEA (CHCC-In House): 2.81 ng/mL (ref 0.00–5.00)

## 2019-04-12 LAB — LACTATE DEHYDROGENASE: LDH: 130 U/L (ref 98–192)

## 2019-04-19 ENCOUNTER — Telehealth: Payer: Self-pay | Admitting: Hematology

## 2019-04-19 NOTE — Telephone Encounter (Signed)
Scheduled per 04/05 los, patient has been called and notified. ?

## 2019-04-20 ENCOUNTER — Telehealth: Payer: Self-pay | Admitting: *Deleted

## 2019-04-20 NOTE — Telephone Encounter (Signed)
-----   Message from Brunetta Genera, MD sent at 04/18/2019 11:13 PM EDT ----- Plz let patient know his CEA,  LDH and ferritin levels are WNL

## 2019-04-20 NOTE — Telephone Encounter (Signed)
Contacted patient regarding test results per Dr. Grier Mitts directions in attached message. Patient verbalized understanding and requested copy of labs mailed to his home. Labs mailed as requested

## 2019-04-24 DIAGNOSIS — C189 Malignant neoplasm of colon, unspecified: Secondary | ICD-10-CM | POA: Diagnosis not present

## 2019-04-24 DIAGNOSIS — E782 Mixed hyperlipidemia: Secondary | ICD-10-CM | POA: Diagnosis not present

## 2019-04-24 DIAGNOSIS — E1142 Type 2 diabetes mellitus with diabetic polyneuropathy: Secondary | ICD-10-CM | POA: Diagnosis not present

## 2019-04-24 DIAGNOSIS — N400 Enlarged prostate without lower urinary tract symptoms: Secondary | ICD-10-CM | POA: Diagnosis not present

## 2019-04-24 DIAGNOSIS — N1831 Chronic kidney disease, stage 3a: Secondary | ICD-10-CM | POA: Diagnosis not present

## 2019-04-24 DIAGNOSIS — H409 Unspecified glaucoma: Secondary | ICD-10-CM | POA: Diagnosis not present

## 2019-04-24 DIAGNOSIS — I10 Essential (primary) hypertension: Secondary | ICD-10-CM | POA: Diagnosis not present

## 2019-04-24 DIAGNOSIS — I2510 Atherosclerotic heart disease of native coronary artery without angina pectoris: Secondary | ICD-10-CM | POA: Diagnosis not present

## 2019-04-24 DIAGNOSIS — N183 Chronic kidney disease, stage 3 unspecified: Secondary | ICD-10-CM | POA: Diagnosis not present

## 2019-04-24 DIAGNOSIS — Z85038 Personal history of other malignant neoplasm of large intestine: Secondary | ICD-10-CM | POA: Diagnosis not present

## 2019-04-24 DIAGNOSIS — M1990 Unspecified osteoarthritis, unspecified site: Secondary | ICD-10-CM | POA: Diagnosis not present

## 2019-04-24 DIAGNOSIS — I252 Old myocardial infarction: Secondary | ICD-10-CM | POA: Diagnosis not present

## 2019-04-27 DIAGNOSIS — H353122 Nonexudative age-related macular degeneration, left eye, intermediate dry stage: Secondary | ICD-10-CM | POA: Diagnosis not present

## 2019-06-21 DIAGNOSIS — I779 Disorder of arteries and arterioles, unspecified: Secondary | ICD-10-CM | POA: Diagnosis not present

## 2019-06-21 DIAGNOSIS — I252 Old myocardial infarction: Secondary | ICD-10-CM | POA: Diagnosis not present

## 2019-06-21 DIAGNOSIS — I739 Peripheral vascular disease, unspecified: Secondary | ICD-10-CM | POA: Diagnosis not present

## 2019-06-21 DIAGNOSIS — N1831 Chronic kidney disease, stage 3a: Secondary | ICD-10-CM | POA: Diagnosis not present

## 2019-06-21 DIAGNOSIS — N400 Enlarged prostate without lower urinary tract symptoms: Secondary | ICD-10-CM | POA: Diagnosis not present

## 2019-06-21 DIAGNOSIS — I2510 Atherosclerotic heart disease of native coronary artery without angina pectoris: Secondary | ICD-10-CM | POA: Diagnosis not present

## 2019-06-21 DIAGNOSIS — I10 Essential (primary) hypertension: Secondary | ICD-10-CM | POA: Diagnosis not present

## 2019-06-21 DIAGNOSIS — E782 Mixed hyperlipidemia: Secondary | ICD-10-CM | POA: Diagnosis not present

## 2019-06-21 DIAGNOSIS — E1165 Type 2 diabetes mellitus with hyperglycemia: Secondary | ICD-10-CM | POA: Diagnosis not present

## 2019-06-21 DIAGNOSIS — E1142 Type 2 diabetes mellitus with diabetic polyneuropathy: Secondary | ICD-10-CM | POA: Diagnosis not present

## 2019-06-21 DIAGNOSIS — K219 Gastro-esophageal reflux disease without esophagitis: Secondary | ICD-10-CM | POA: Diagnosis not present

## 2019-07-07 DIAGNOSIS — E1142 Type 2 diabetes mellitus with diabetic polyneuropathy: Secondary | ICD-10-CM | POA: Diagnosis not present

## 2019-07-07 DIAGNOSIS — E782 Mixed hyperlipidemia: Secondary | ICD-10-CM | POA: Diagnosis not present

## 2019-07-07 DIAGNOSIS — N1831 Chronic kidney disease, stage 3a: Secondary | ICD-10-CM | POA: Diagnosis not present

## 2019-07-07 DIAGNOSIS — I2510 Atherosclerotic heart disease of native coronary artery without angina pectoris: Secondary | ICD-10-CM | POA: Diagnosis not present

## 2019-07-07 DIAGNOSIS — N183 Chronic kidney disease, stage 3 unspecified: Secondary | ICD-10-CM | POA: Diagnosis not present

## 2019-07-07 DIAGNOSIS — M1990 Unspecified osteoarthritis, unspecified site: Secondary | ICD-10-CM | POA: Diagnosis not present

## 2019-07-07 DIAGNOSIS — I10 Essential (primary) hypertension: Secondary | ICD-10-CM | POA: Diagnosis not present

## 2019-07-07 DIAGNOSIS — C189 Malignant neoplasm of colon, unspecified: Secondary | ICD-10-CM | POA: Diagnosis not present

## 2019-07-07 DIAGNOSIS — Z85038 Personal history of other malignant neoplasm of large intestine: Secondary | ICD-10-CM | POA: Diagnosis not present

## 2019-07-07 DIAGNOSIS — I252 Old myocardial infarction: Secondary | ICD-10-CM | POA: Diagnosis not present

## 2019-07-07 DIAGNOSIS — H409 Unspecified glaucoma: Secondary | ICD-10-CM | POA: Diagnosis not present

## 2019-07-07 DIAGNOSIS — N400 Enlarged prostate without lower urinary tract symptoms: Secondary | ICD-10-CM | POA: Diagnosis not present

## 2019-07-16 DIAGNOSIS — N289 Disorder of kidney and ureter, unspecified: Secondary | ICD-10-CM | POA: Diagnosis not present

## 2019-08-02 DIAGNOSIS — M170 Bilateral primary osteoarthritis of knee: Secondary | ICD-10-CM | POA: Diagnosis not present

## 2019-08-02 DIAGNOSIS — M1711 Unilateral primary osteoarthritis, right knee: Secondary | ICD-10-CM | POA: Diagnosis not present

## 2019-08-02 DIAGNOSIS — M1712 Unilateral primary osteoarthritis, left knee: Secondary | ICD-10-CM | POA: Diagnosis not present

## 2019-08-02 DIAGNOSIS — M25562 Pain in left knee: Secondary | ICD-10-CM | POA: Diagnosis not present

## 2019-08-02 DIAGNOSIS — M25561 Pain in right knee: Secondary | ICD-10-CM | POA: Diagnosis not present

## 2019-08-04 DIAGNOSIS — E113292 Type 2 diabetes mellitus with mild nonproliferative diabetic retinopathy without macular edema, left eye: Secondary | ICD-10-CM | POA: Diagnosis not present

## 2019-08-04 DIAGNOSIS — H2521 Age-related cataract, morgagnian type, right eye: Secondary | ICD-10-CM | POA: Diagnosis not present

## 2019-08-04 DIAGNOSIS — H353122 Nonexudative age-related macular degeneration, left eye, intermediate dry stage: Secondary | ICD-10-CM | POA: Diagnosis not present

## 2019-08-04 DIAGNOSIS — H211X1 Other vascular disorders of iris and ciliary body, right eye: Secondary | ICD-10-CM | POA: Diagnosis not present

## 2019-08-23 DIAGNOSIS — R05 Cough: Secondary | ICD-10-CM | POA: Diagnosis not present

## 2019-08-31 DIAGNOSIS — I10 Essential (primary) hypertension: Secondary | ICD-10-CM | POA: Diagnosis not present

## 2019-08-31 DIAGNOSIS — Z7984 Long term (current) use of oral hypoglycemic drugs: Secondary | ICD-10-CM | POA: Diagnosis not present

## 2019-08-31 DIAGNOSIS — H409 Unspecified glaucoma: Secondary | ICD-10-CM | POA: Diagnosis not present

## 2019-08-31 DIAGNOSIS — M1990 Unspecified osteoarthritis, unspecified site: Secondary | ICD-10-CM | POA: Diagnosis not present

## 2019-08-31 DIAGNOSIS — I2510 Atherosclerotic heart disease of native coronary artery without angina pectoris: Secondary | ICD-10-CM | POA: Diagnosis not present

## 2019-08-31 DIAGNOSIS — E1142 Type 2 diabetes mellitus with diabetic polyneuropathy: Secondary | ICD-10-CM | POA: Diagnosis not present

## 2019-08-31 DIAGNOSIS — N400 Enlarged prostate without lower urinary tract symptoms: Secondary | ICD-10-CM | POA: Diagnosis not present

## 2019-08-31 DIAGNOSIS — N1831 Chronic kidney disease, stage 3a: Secondary | ICD-10-CM | POA: Diagnosis not present

## 2019-08-31 DIAGNOSIS — N183 Chronic kidney disease, stage 3 unspecified: Secondary | ICD-10-CM | POA: Diagnosis not present

## 2019-08-31 DIAGNOSIS — E782 Mixed hyperlipidemia: Secondary | ICD-10-CM | POA: Diagnosis not present

## 2019-08-31 DIAGNOSIS — C189 Malignant neoplasm of colon, unspecified: Secondary | ICD-10-CM | POA: Diagnosis not present

## 2019-08-31 DIAGNOSIS — I252 Old myocardial infarction: Secondary | ICD-10-CM | POA: Diagnosis not present

## 2019-09-24 DIAGNOSIS — M25561 Pain in right knee: Secondary | ICD-10-CM | POA: Diagnosis not present

## 2019-11-01 DIAGNOSIS — H401131 Primary open-angle glaucoma, bilateral, mild stage: Secondary | ICD-10-CM | POA: Diagnosis not present

## 2019-11-01 DIAGNOSIS — M25561 Pain in right knee: Secondary | ICD-10-CM | POA: Diagnosis not present

## 2019-11-01 DIAGNOSIS — M170 Bilateral primary osteoarthritis of knee: Secondary | ICD-10-CM | POA: Diagnosis not present

## 2019-11-01 DIAGNOSIS — H353122 Nonexudative age-related macular degeneration, left eye, intermediate dry stage: Secondary | ICD-10-CM | POA: Diagnosis not present

## 2019-11-01 DIAGNOSIS — M25562 Pain in left knee: Secondary | ICD-10-CM | POA: Diagnosis not present

## 2019-12-08 DIAGNOSIS — R059 Cough, unspecified: Secondary | ICD-10-CM | POA: Diagnosis not present

## 2019-12-09 DIAGNOSIS — Z03818 Encounter for observation for suspected exposure to other biological agents ruled out: Secondary | ICD-10-CM | POA: Diagnosis not present

## 2019-12-09 DIAGNOSIS — R059 Cough, unspecified: Secondary | ICD-10-CM | POA: Diagnosis not present

## 2019-12-20 DIAGNOSIS — I739 Peripheral vascular disease, unspecified: Secondary | ICD-10-CM | POA: Diagnosis not present

## 2019-12-20 DIAGNOSIS — N1831 Chronic kidney disease, stage 3a: Secondary | ICD-10-CM | POA: Diagnosis not present

## 2019-12-20 DIAGNOSIS — E1142 Type 2 diabetes mellitus with diabetic polyneuropathy: Secondary | ICD-10-CM | POA: Diagnosis not present

## 2019-12-20 DIAGNOSIS — Z6837 Body mass index (BMI) 37.0-37.9, adult: Secondary | ICD-10-CM | POA: Diagnosis not present

## 2019-12-20 DIAGNOSIS — Z23 Encounter for immunization: Secondary | ICD-10-CM | POA: Diagnosis not present

## 2019-12-20 DIAGNOSIS — I700 Atherosclerosis of aorta: Secondary | ICD-10-CM | POA: Diagnosis not present

## 2019-12-20 DIAGNOSIS — Z85038 Personal history of other malignant neoplasm of large intestine: Secondary | ICD-10-CM | POA: Diagnosis not present

## 2019-12-20 DIAGNOSIS — E1165 Type 2 diabetes mellitus with hyperglycemia: Secondary | ICD-10-CM | POA: Diagnosis not present

## 2019-12-20 DIAGNOSIS — K219 Gastro-esophageal reflux disease without esophagitis: Secondary | ICD-10-CM | POA: Diagnosis not present

## 2019-12-20 DIAGNOSIS — Z0000 Encounter for general adult medical examination without abnormal findings: Secondary | ICD-10-CM | POA: Diagnosis not present

## 2019-12-20 DIAGNOSIS — E782 Mixed hyperlipidemia: Secondary | ICD-10-CM | POA: Diagnosis not present

## 2019-12-20 DIAGNOSIS — Z7984 Long term (current) use of oral hypoglycemic drugs: Secondary | ICD-10-CM | POA: Diagnosis not present

## 2019-12-20 DIAGNOSIS — Z1389 Encounter for screening for other disorder: Secondary | ICD-10-CM | POA: Diagnosis not present

## 2019-12-20 DIAGNOSIS — I2510 Atherosclerotic heart disease of native coronary artery without angina pectoris: Secondary | ICD-10-CM | POA: Diagnosis not present

## 2019-12-20 DIAGNOSIS — I10 Essential (primary) hypertension: Secondary | ICD-10-CM | POA: Diagnosis not present

## 2020-02-02 DIAGNOSIS — H353122 Nonexudative age-related macular degeneration, left eye, intermediate dry stage: Secondary | ICD-10-CM | POA: Diagnosis not present

## 2020-02-02 DIAGNOSIS — H211X1 Other vascular disorders of iris and ciliary body, right eye: Secondary | ICD-10-CM | POA: Diagnosis not present

## 2020-02-02 DIAGNOSIS — H2521 Age-related cataract, morgagnian type, right eye: Secondary | ICD-10-CM | POA: Diagnosis not present

## 2020-02-02 DIAGNOSIS — E113292 Type 2 diabetes mellitus with mild nonproliferative diabetic retinopathy without macular edema, left eye: Secondary | ICD-10-CM | POA: Diagnosis not present

## 2020-02-07 DIAGNOSIS — I252 Old myocardial infarction: Secondary | ICD-10-CM | POA: Diagnosis not present

## 2020-02-07 DIAGNOSIS — C189 Malignant neoplasm of colon, unspecified: Secondary | ICD-10-CM | POA: Diagnosis not present

## 2020-02-07 DIAGNOSIS — M1990 Unspecified osteoarthritis, unspecified site: Secondary | ICD-10-CM | POA: Diagnosis not present

## 2020-02-07 DIAGNOSIS — I2510 Atherosclerotic heart disease of native coronary artery without angina pectoris: Secondary | ICD-10-CM | POA: Diagnosis not present

## 2020-02-07 DIAGNOSIS — E1142 Type 2 diabetes mellitus with diabetic polyneuropathy: Secondary | ICD-10-CM | POA: Diagnosis not present

## 2020-02-07 DIAGNOSIS — I10 Essential (primary) hypertension: Secondary | ICD-10-CM | POA: Diagnosis not present

## 2020-02-07 DIAGNOSIS — K219 Gastro-esophageal reflux disease without esophagitis: Secondary | ICD-10-CM | POA: Diagnosis not present

## 2020-02-07 DIAGNOSIS — N400 Enlarged prostate without lower urinary tract symptoms: Secondary | ICD-10-CM | POA: Diagnosis not present

## 2020-02-07 DIAGNOSIS — N183 Chronic kidney disease, stage 3 unspecified: Secondary | ICD-10-CM | POA: Diagnosis not present

## 2020-02-07 DIAGNOSIS — E782 Mixed hyperlipidemia: Secondary | ICD-10-CM | POA: Diagnosis not present

## 2020-02-07 DIAGNOSIS — H409 Unspecified glaucoma: Secondary | ICD-10-CM | POA: Diagnosis not present

## 2020-02-07 DIAGNOSIS — N1831 Chronic kidney disease, stage 3a: Secondary | ICD-10-CM | POA: Diagnosis not present

## 2020-02-10 DIAGNOSIS — C189 Malignant neoplasm of colon, unspecified: Secondary | ICD-10-CM | POA: Diagnosis not present

## 2020-02-14 ENCOUNTER — Telehealth: Payer: Self-pay

## 2020-02-14 NOTE — Telephone Encounter (Signed)
   [facility] Pre-operative Risk Assessment      Request for surgical clearance:  1. What type of surgery is being performed? Colonoscopy  2. When is this surgery scheduled? 03/13/20  3. What type of clearance is required (medical clearance vs. Pharmacy clearance to hold med vs. Both)? Both  Are there any medications that need to be held prior to surgery and how long? plavix 4. Practice name and name of physician performing surgery? [facility]; Dr. Watt Climes  5. What is the office phone number? [phone]   7.   What is the office fax number? [phone]  8.   Anesthesia type (None, local, MAC, general)  Propofol   Edman Circle 02/14/2020, 12:25 PM  _________________________________________________________________   (provider comments below)

## 2020-02-14 NOTE — Telephone Encounter (Signed)
Dr. Tamala Julian, This patient has CAD with PCI in ?2005 (we do not have records) and a low risk nuclear stress test in 2019. We are asked to hold plavix for 5-7 days prior to colonoscopy. Can you please provide recommendations?

## 2020-02-17 NOTE — Telephone Encounter (Signed)
S/w pt and he is agreeable to plan of care for pre op appt. Pt states he cannot do 2/15 as I had set up. Pt has now been scheduled to see Doreene Adas, [facility] @ [place] 02/28/20 @ 8:45. Pt verbalized confirmation of appt time, day and location. Pt thanked me for the call and the help. Will send FYI to requesting office pt has upcoming appt. Will send notes to [facility] for upcoming appt.

## 2020-02-17 NOTE — Telephone Encounter (Signed)
   Primary Cardiologist: Sinclair Grooms, MD  Chart reviewed as part of pre-operative protocol coverage. Because of Mark REINIG Jr.'s past medical history and time since last visit, he will require a follow-up visit in order to better assess preoperative cardiovascular risk.  Last OV was >1 year ago on 11/2018.  Pre-op covering staff: - Please schedule appointment and call patient to inform them. If patient already had an upcoming appointment within acceptable timeframe, please add "pre-op clearance" to the appointment notes so provider is aware. - Please contact requesting surgeon's office via preferred method (i.e, phone, fax) to inform them of need for appointment prior to surgery.  Message had already been sent as below to Dr. Tamala Julian to discuss Plavix.  Charlie Pitter, PA-C  02/17/2020, 9:42 AM

## 2020-02-17 NOTE — Telephone Encounter (Signed)
I tried to reach the pt in order to schedule a pre op clearance appt. Pt states he is in a building that he cannot hear anything and ask for me to call back in 1 hour. In the meantime I did go ahead and schedule pt as there are limited appts available. Pt has an appt 02/22/20 @ 9:15 with Kerin Ransom, PAC at our [place] office as there were no in person appt's available for pre op clearance. I will confirm with the pt when I call him back if this acceptable.

## 2020-02-22 ENCOUNTER — Ambulatory Visit: Payer: PPO | Admitting: Cardiology

## 2020-02-23 NOTE — Progress Notes (Signed)
Cardiology Office Note:    Date:  02/28/2020   ID:  [name], DOB 1940-11-15, MRN [id number]  PCP:  Wenda Low, MD  Cardiologist:  Sinclair Grooms, MD   Referring MD: Wenda Low, MD   Chief Complaint  Patient presents with  . Pre-op Exam  CAD, carotid artery stenosis  History of Present Illness:    Mark Cropp. is a 80 y.o. male with a hx of right subclavian artery stenosis, HTN, HLD, CAD, carotid artery stenosis, DM, HFpEF, follicular lymphoma, and blindness in right eye. He had a stent placed in 2005 (location unknown, no records available). Carotid US 12/2016 with occluded right ICA, left ICA with 40-59% stenosis, stenosis in the right subclavian artery.  He follows with Dr. Tamala Julian and was last seen 11/2018 and was recovering from COVID-19 infection at that time. He is sedentary. He has known occlusive right carotid artery disease. Nuclear stress test 09/30/17 was negative for ischemia.   He presents today for preop clearance for colonoscopy on March 7. His BP is up, but he did not take his morning medications prior to his visit. Due for losartan and metoprolol. He fell last Wed and fell on his knees. He had a mechanical fall at church, no prodrome. He did not lose consciousness and only injured his left knee. No chest pain, dyspnea, DOE, orthopnea, and syncope. Mild B LE swelling. He is able to complete 4.0 METS without angina. He is primarily limited by knee pain.    Past Medical History:  Diagnosis Date  . Anemia, iron deficiency 01/26/2011  . Arthritis   . Asthma   . Cancer (HCC)    nhl, colon ca  . Carotid artery occlusion    right occlusion of carotid with moderate left plaque  . Coronary artery disease August 2005   MI with stent  . Diabetes mellitus   . Hyperlipidemia   . Hypertension   . Kidney stones 05/2010   lithotripsy  . Myocardial infarction [facility])     Past Surgical History:  Procedure Laterality Date  . CARPAL TUNNEL RELEASE  Left 05/08/2015   Procedure: CARPAL TUNNEL RELEASE;  Surgeon: Charlotte Crumb, MD;  Location: [place];  Service: Orthopedics;  Laterality: Left;  . COLON SURGERY    . COLONOSCOPY    . CORONARY STENT PLACEMENT    . HERNIA REPAIR    . OPEN REDUCTION INTERNAL FIXATION (ORIF) DISTAL RADIAL FRACTURE Left 05/08/2015   Procedure: OPEN REDUCTION INTERNAL FIXATION (ORIF) DISTAL RADIAL FRACTURE, CARPAL TUNNEL RELEASE;  Surgeon: Charlotte Crumb, MD;  Location: [place];  Service: Orthopedics;  Laterality: Left;  . REPAIR OF COMPLEX TRACTION RETINAL DETACHMENT      Current Medications: Current Meds  Medication Sig  . Ascorbic Acid (VITAMIN C PO) Take 1 tablet by mouth daily.  Marland Kitchen aspirin 81 MG tablet Take 81 mg by mouth daily.  Marland Kitchen atorvastatin (LIPITOR) 40 MG tablet Take 40 mg by mouth daily.  . clopidogrel (PLAVIX) 75 MG tablet Take 75 mg by mouth daily.  . Coenzyme Q10 (CO Q 10 PO) Take 1 capsule by mouth daily.   . dorzolamide-timolol (COSOPT) 22.3-6.8 MG/ML ophthalmic solution Place 1 drop into both eyes 2 (two) times daily.  Marland Kitchen glucosamine-chondroitin 500-400 MG tablet Take 1 tablet by mouth daily.   Marland Kitchen losartan (COZAAR) 100 MG tablet Take 100 mg by mouth daily.  . Lutein 10 MG TABS Take 1 tablet by mouth daily.   Marland Kitchen LYCOPENE PO Take 1  tablet by mouth daily.   . metFORMIN (GLUCOPHAGE) 500 MG tablet Take 1,000 mg by mouth 2 (two) times daily with a meal.  . metoprolol (LOPRESSOR) 50 MG tablet Take 25 mg by mouth 2 (two) times daily.  . Multiple Vitamins-Minerals (ZINC PO) Take 1 tablet by mouth daily.  . nitroGLYCERIN (NITROSTAT) 0.4 MG SL tablet Place 0.4 mg under the tongue every 5 (five) minutes x 3 doses as needed for chest pain.  . Omega-3 Fatty Acids (FISH OIL) 1000 MG CAPS Take 1 capsule by mouth daily.  . pantoprazole (PROTONIX) 40 MG tablet Take 40 mg by mouth daily.   . sitaGLIPtin (JANUVIA) 100 MG tablet Take 100 mg by mouth daily.  Marland Kitchen VITAMIN D PO Take 1 tablet by mouth daily.     Allergies:    Iodine, Iodinated diagnostic agents, and Lisinopril   Social History   Socioeconomic History  . Marital status: Married    Spouse name: Not on file  . Number of children: Not on file  . Years of education: Not on file  . Highest education level: Not on file  Occupational History  . Not on file  Tobacco Use  . Smoking status: Former Smoker    Packs/day: 1.00    Years: 20.00    Pack years: 20.00    Quit date: 01/08/1980    Years since quitting: 40.1  . Smokeless tobacco: Never Used  Vaping Use  . Vaping Use: Never used  Substance and Sexual Activity  . Alcohol use: No    Alcohol/week: 0.0 standard drinks  . Drug use: No  . Sexual activity: Not on file  Other Topics Concern  . Not on file  Social History Narrative  . Not on file   Social Determinants of Health   Financial Resource Strain: Not on file  Food Insecurity: Not on file  Transportation Needs: Not on file  Physical Activity: Not on file  Stress: Not on file  Social Connections: Not on file     Family History: The patient's family history includes Cancer in his brother, father, maternal aunt, and mother; Healthy in his sister.  ROS:   Please see the history of present illness.     All other systems reviewed and are negative.  EKGs/Labs/Other Studies Reviewed:    The following studies were reviewed today:  Lexiscan Stress test 10/02/2017:    Nuclear stress EF: 67%.  There was no ST segment deviation noted during stress.  The study is normal.  This is a low risk study.  The left ventricular ejection fraction is hyperdynamic (>65%).  Normal resting and stress perfusion. No ischemia or infarction EF 67% but appears to be LVE  EKG:  EKG is  ordered today.  The ekg ordered today demonstrates sinus bradycardia with HR 59  Recent Labs: 04/12/2019: ALT 25; BUN 18; Creatinine 1.31; Hemoglobin 12.6; Platelets 166; Potassium 4.7; Sodium 142  Recent Lipid Panel    Component Value Date/Time   CHOL 139  05/08/2005 1216   TRIG 97 05/08/2005 1216   HDL 31 (L) 05/08/2005 1216   CHOLHDL 4.5 05/08/2005 1216   VLDL 19 05/08/2005 1216   LDLCALC 89 05/08/2005 1216    Physical Exam:    VS:  BP (!) 164/72   Pulse (!) 59   Ht 5\' 9"  (1.753 m)   Wt 248 lb 6.4 oz (112.7 kg)   BMI 36.68 kg/m     Wt Readings from Last 3 Encounters:  02/28/20 248 lb 6.4  oz (112.7 kg)  04/12/19 241 lb 4.8 oz (109.5 kg)  12/07/18 235 lb 1.9 oz (106.6 kg)     GEN:  Well nourished, well developed in no acute distress HEENT: Normal NECK: No JVD; No carotid bruits LYMPHATICS: No lymphadenopathy CARDIAC: RRR, no murmurs, rubs, gallops RESPIRATORY:  Clear to auscultation without rales, wheezing or rhonchi  ABDOMEN: Soft, non-tender, non-distended MUSCULOSKELETAL:  No edema; No deformity  SKIN: Warm and dry NEUROLOGIC:  Alert and oriented x 3 PSYCHIATRIC:  Normal affect   ASSESSMENT:    1. Carotid stenosis, bilateral   2. Essential hypertension, benign   3. Coronary artery disease with hx of myocardial infarct w/o hx of CABG   4. Mixed hyperlipidemia   5. Preoperative clearance    PLAN:    In order of problems listed above:  Bilateral carotid artery disease - lipid control will be important to prevent progression of diease - total occlusion of right carotid artery - allow for some permissive HTN - no recent syncope - will repeat this - last study in 2018 - prefer this be completed prior to surgery  - no history of stroke   Hypertension - has been stable on losartan 100 mg, 25 mg lopressor BID - pressure elevated today because he hasn't taken morning medication yet - reports BP was "good" at recent PCP visit    CAD  - stent placed in 2005, unclear which vessel - has been maintained on ASA and plavix - reassuring lexiscan myoview in 2019 - no chest pain with activity - can climb stairs, moderate housework - primarily limited by knee pain   Hyperlipidemia with LDL goal < 70 - need updated  lipid profile - will collect this when he comes for carotid US - continue 40 mg lipitor   Preop clearance for colonoscopy He has a history of CAD, carotid artery stenosis with occluded right ICA and stenosis in right subclavian artery, DM not on insulin, and normal renal function. He is primarily limited by knee pain, but can complete 4.0 METS (can climb flight of stairs, gorcery shopping and carrying groceries in from car). Will obtain repeat carotid artery dopplers. He is aware that he does have higher risk for complications and wishes to proceed. He is acceptable risk (RCRI: 0.9% risk for cardiac complication) to proceed with low risk colonoscopy without further testing. He may hold plavix 5-7 days prior to procedure, resume as soon as safe after procedure. Given his disease, prefer to continue ASA throughout perioperative period.   Follow up with Dr. Tamala Julian in 1 year.     Medication Adjustments/Labs and Tests Ordered: Current medicines are reviewed at length with the patient today.  Concerns regarding medicines are outlined above.  Orders Placed This Encounter  Procedures  . Lipid panel  . EKG 12-Lead  . VAS US CAROTID   No orders of the defined types were placed in this encounter.   Signed, Ledora Bottcher, [place]  02/28/2020 9:26 AM    [facility]

## 2020-02-28 ENCOUNTER — Encounter: Payer: Self-pay | Admitting: Physician Assistant

## 2020-02-28 ENCOUNTER — Other Ambulatory Visit: Payer: Self-pay

## 2020-02-28 ENCOUNTER — Ambulatory Visit: Payer: PPO | Admitting: Physician Assistant

## 2020-02-28 VITALS — BP 164/72 | HR 59 | Ht 69.0 in | Wt 248.4 lb

## 2020-02-28 DIAGNOSIS — Z01818 Encounter for other preprocedural examination: Secondary | ICD-10-CM

## 2020-02-28 DIAGNOSIS — I252 Old myocardial infarction: Secondary | ICD-10-CM

## 2020-02-28 DIAGNOSIS — E782 Mixed hyperlipidemia: Secondary | ICD-10-CM

## 2020-02-28 DIAGNOSIS — M1712 Unilateral primary osteoarthritis, left knee: Secondary | ICD-10-CM | POA: Diagnosis not present

## 2020-02-28 DIAGNOSIS — I6523 Occlusion and stenosis of bilateral carotid arteries: Secondary | ICD-10-CM

## 2020-02-28 DIAGNOSIS — I10 Essential (primary) hypertension: Secondary | ICD-10-CM | POA: Diagnosis not present

## 2020-02-28 DIAGNOSIS — I2510 Atherosclerotic heart disease of native coronary artery without angina pectoris: Secondary | ICD-10-CM

## 2020-02-28 DIAGNOSIS — M25462 Effusion, left knee: Secondary | ICD-10-CM | POA: Diagnosis not present

## 2020-02-28 NOTE — Patient Instructions (Addendum)
Medication Instructions:  No Changes *If you need a refill on your cardiac medications before your next appointment, please call your pharmacy*   Lab Work: Lipid Panel If you have labs (blood work) drawn today and your tests are completely normal, you will receive your results only by: Marland Kitchen MyChart Message (if you have MyChart) OR . A paper copy in the mail If you have any lab test that is abnormal or we need to change your treatment, we will call you to review the results.   Testing/Procedures: [place], [address] Your physician has requested that you have a carotid duplex. This test is an ultrasound of the carotid arteries in your neck. It looks at blood flow through these arteries that supply the brain with blood. Allow one hour for this exam. There are no restrictions or special instructions.    Follow-Up: At [facility], you and your health needs are our priority.  As part of our continuing mission to provide you with exceptional heart care, we have created designated Provider Care Teams.  These Care Teams include your primary Cardiologist (physician) and Advanced Practice Providers (APPs -  Physician Assistants and Nurse Practitioners) who all work together to provide you with the care you need, when you need it.  We recommend signing up for the patient portal called "MyChart".  Sign up information is provided on this After Visit Summary.  MyChart is used to connect with patients for Virtual Visits (Telemedicine).  Patients are able to view lab/test results, encounter notes, upcoming appointments, etc.  Non-urgent messages can be sent to your provider as well.   To learn more about what you can do with MyChart, go to [email or web address].    Your next appointment:   1 year(s)  The format for your next appointment:   In Person  Provider:   Daneen Schick, MD

## 2020-03-01 ENCOUNTER — Other Ambulatory Visit: Payer: Self-pay | Admitting: Physician Assistant

## 2020-03-01 ENCOUNTER — Other Ambulatory Visit: Payer: Self-pay

## 2020-03-01 ENCOUNTER — Ambulatory Visit (HOSPITAL_COMMUNITY)
Admission: RE | Admit: 2020-03-01 | Discharge: 2020-03-01 | Disposition: A | Discharge status: Home / Self Care | Payer: PPO | Source: Ambulatory Visit | Attending: Cardiovascular Disease | Admitting: Cardiovascular Disease

## 2020-03-01 DIAGNOSIS — I10 Essential (primary) hypertension: Secondary | ICD-10-CM | POA: Insufficient documentation

## 2020-03-01 DIAGNOSIS — Z01818 Encounter for other preprocedural examination: Secondary | ICD-10-CM | POA: Insufficient documentation

## 2020-03-01 DIAGNOSIS — I6523 Occlusion and stenosis of bilateral carotid arteries: Secondary | ICD-10-CM

## 2020-03-01 DIAGNOSIS — I2510 Atherosclerotic heart disease of native coronary artery without angina pectoris: Secondary | ICD-10-CM | POA: Diagnosis not present

## 2020-03-01 DIAGNOSIS — K219 Gastro-esophageal reflux disease without esophagitis: Secondary | ICD-10-CM | POA: Diagnosis not present

## 2020-03-01 DIAGNOSIS — E1142 Type 2 diabetes mellitus with diabetic polyneuropathy: Secondary | ICD-10-CM | POA: Diagnosis not present

## 2020-03-01 DIAGNOSIS — H409 Unspecified glaucoma: Secondary | ICD-10-CM | POA: Diagnosis not present

## 2020-03-01 DIAGNOSIS — I252 Old myocardial infarction: Secondary | ICD-10-CM | POA: Insufficient documentation

## 2020-03-01 DIAGNOSIS — E782 Mixed hyperlipidemia: Secondary | ICD-10-CM | POA: Diagnosis not present

## 2020-03-01 DIAGNOSIS — N400 Enlarged prostate without lower urinary tract symptoms: Secondary | ICD-10-CM | POA: Diagnosis not present

## 2020-03-01 DIAGNOSIS — N183 Chronic kidney disease, stage 3 unspecified: Secondary | ICD-10-CM | POA: Diagnosis not present

## 2020-03-01 DIAGNOSIS — C189 Malignant neoplasm of colon, unspecified: Secondary | ICD-10-CM | POA: Diagnosis not present

## 2020-03-01 DIAGNOSIS — M1990 Unspecified osteoarthritis, unspecified site: Secondary | ICD-10-CM | POA: Diagnosis not present

## 2020-03-01 LAB — LIPID PANEL
Chol/HDL Ratio: 3.2 ratio (ref 0.0–5.0)
Cholesterol, Total: 108 mg/dL (ref 100–199)
HDL: 34 mg/dL — ABNORMAL LOW (ref >39)
LDL Chol Calc (NIH): 62 mg/dL (ref 0–99)
Triglycerides: 47 mg/dL (ref 0–149)
VLDL Cholesterol Cal: 12 mg/dL (ref 5–40)

## 2020-03-06 DIAGNOSIS — E113293 Type 2 diabetes mellitus with mild nonproliferative diabetic retinopathy without macular edema, bilateral: Secondary | ICD-10-CM | POA: Diagnosis not present

## 2020-03-06 DIAGNOSIS — H401131 Primary open-angle glaucoma, bilateral, mild stage: Secondary | ICD-10-CM | POA: Diagnosis not present

## 2020-03-09 DIAGNOSIS — Z01812 Encounter for preprocedural laboratory examination: Secondary | ICD-10-CM | POA: Diagnosis not present

## 2020-03-13 DIAGNOSIS — Z980 Intestinal bypass and anastomosis status: Secondary | ICD-10-CM | POA: Diagnosis not present

## 2020-03-13 DIAGNOSIS — Z800 Family history of malignant neoplasm of digestive organs: Secondary | ICD-10-CM | POA: Diagnosis not present

## 2020-03-13 DIAGNOSIS — R17 Unspecified jaundice: Secondary | ICD-10-CM | POA: Diagnosis not present

## 2020-03-13 DIAGNOSIS — Z85038 Personal history of other malignant neoplasm of large intestine: Secondary | ICD-10-CM | POA: Diagnosis not present

## 2020-03-13 DIAGNOSIS — K5140 Inflammatory polyps of colon without complications: Secondary | ICD-10-CM | POA: Diagnosis not present

## 2020-03-17 DIAGNOSIS — K5140 Inflammatory polyps of colon without complications: Secondary | ICD-10-CM | POA: Diagnosis not present

## 2020-04-11 NOTE — Progress Notes (Signed)
Marland Kitchen  HEMATOLOGY ONCOLOGY PROGRESS NOTE  Date of service: 04/12/20    Patient Care Team: Wenda Low, MD as PCP - General (Internal Medicine) Belva Crome, MD as PCP - Cardiology (Cardiology)  CC:  f/u for  history of colon cancer  low grade follicular NHL    INTERVAL HISTORY: [name] is here for follow-up for his history of colon cancer and low-grade follicular non-Hodgkin's lymphoma which has not required treatment. He has also had history of previous iron deficiency anemia thought to be from colonic polyps. The patient's last visit with [place] was on 04/12/2019. The pt reports that he is doing well overall.  The pt reports no new concerns or symptoms. The pt notes that he is still not vaccinated and does not desire to get these at this time. The pt notes that his knees are still bothering him and he needs replacement. He continues to get cortisone shots. This has caused him to recently fall, but notes no issues with this. The pt notes he recently had a colonoscopy last month and they removed a polyp but noted no further issues. They suggested a repeat in three years.  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  Lab results today 04/12/2020 of CBC w/diff is as follows: all values are WNL except for RBC of 4.05, Hgb of 12.0, HCT of 37.7.  On review of systems, pt reports chronic knee pain and denies fevers, chills, night sweats, sudden weight loss, changes in bowel habits, new lumps/bumps, acute skin rashes, bloody/black stools, abdominal pain, and any other symptoms.   REVIEW OF SYSTEMS:   10 Point review of Systems was done is negative except as noted above.  . Past Medical History:  Diagnosis Date  . Anemia, iron deficiency 01/26/2011  . Arthritis   . Asthma   . Cancer (HCC)    nhl, colon ca  . Carotid artery occlusion    right occlusion of carotid with moderate left plaque  . Coronary artery disease August  2005   MI with stent  . Diabetes mellitus   . Hyperlipidemia   . Hypertension   . Kidney stones 05/2010   lithotripsy  . Myocardial infarction ([place])     . Past Surgical History:  Procedure Laterality Date  . CARPAL TUNNEL RELEASE Left 05/08/2015   Procedure: CARPAL TUNNEL RELEASE;  Surgeon: Charlotte Crumb, MD;  Location: [place];  Service: Orthopedics;  Laterality: Left;  . COLON SURGERY    . COLONOSCOPY    . CORONARY STENT PLACEMENT    . HERNIA REPAIR    . OPEN REDUCTION INTERNAL FIXATION (ORIF) DISTAL RADIAL FRACTURE Left 05/08/2015   Procedure: OPEN REDUCTION INTERNAL FIXATION (ORIF) DISTAL RADIAL FRACTURE, CARPAL TUNNEL RELEASE;  Surgeon: Charlotte Crumb, MD;  Location: [place];  Service: Orthopedics;  Laterality: Left;  . REPAIR OF COMPLEX TRACTION RETINAL DETACHMENT      . Social History   Tobacco Use  . Smoking status: Former Smoker    Packs/day: 1.00    Years: 20.00    Pack years: 20.00    Quit date: 01/08/1980    Years since quitting: 40.2  . Smokeless tobacco: Never Used  Vaping Use  . Vaping Use: Never used  Substance Use Topics  . Alcohol use: No    Alcohol/week: 0.0 standard drinks  . Drug use: No    ALLERGIES:  is allergic to iodine, iodinated diagnostic agents, and lisinopril.  MEDICATIONS:  Current Outpatient Medications  Medication Sig Dispense Refill  . Ascorbic Acid (VITAMIN C PO) Take 1 tablet by mouth daily.    Marland Kitchen aspirin 81 MG tablet Take 81 mg by mouth daily.    Marland Kitchen atorvastatin (LIPITOR) 40 MG tablet Take 40 mg by mouth daily.    . clopidogrel (PLAVIX) 75 MG tablet Take 75 mg by mouth daily.    . Coenzyme Q10 (CO Q 10 PO) Take 1 capsule by mouth daily.     . dorzolamide-timolol (COSOPT) 22.3-6.8 MG/ML ophthalmic solution Place 1 drop into both eyes 2 (two) times daily.    Marland Kitchen glucosamine-chondroitin 500-400 MG tablet Take 1 tablet by mouth daily.     Marland Kitchen losartan (COZAAR) 100 MG tablet Take 100 mg by mouth daily.    . Lutein 10 MG TABS Take 1 tablet by  mouth daily.     Marland Kitchen LYCOPENE PO Take 1 tablet by mouth daily.     . metFORMIN (GLUCOPHAGE) 500 MG tablet Take 1,000 mg by mouth 2 (two) times daily with a meal.    . metoprolol (LOPRESSOR) 50 MG tablet Take 25 mg by mouth 2 (two) times daily.    . Multiple Vitamins-Minerals (ZINC PO) Take 1 tablet by mouth daily.    . nitroGLYCERIN (NITROSTAT) 0.4 MG SL tablet Place 0.4 mg under the tongue every 5 (five) minutes x 3 doses as needed for chest pain.    . Omega-3 Fatty Acids (FISH OIL) 1000 MG CAPS Take 1 capsule by mouth daily.    . pantoprazole (PROTONIX) 40 MG tablet Take 40 mg by mouth daily.     . sitaGLIPtin (JANUVIA) 100 MG tablet Take 100 mg by mouth daily.    Marland Kitchen VITAMIN D PO Take 1 tablet by mouth daily.     No current facility-administered medications for this visit.    PHYSICAL EXAMINATION: ECOG PERFORMANCE STATUS: 2 - Symptomatic, <50% confined to bed  Vitals:   04/12/20 1021  BP: (!) 166/58  Pulse: (!) 54  Resp: 18  Temp: (!) 97 F (36.1 C)  SpO2: 98%    Filed Weights   04/12/20 1021  Weight: 245 lb (111.1 kg)   .Body mass index is 36.18 kg/m.    GENERAL:alert, in no acute distress and comfortable SKIN: no acute rashes, no significant lesions EYES: conjunctiva are pink and non-injected, sclera anicteric OROPHARYNX: MMM, no exudates, no oropharyngeal erythema or ulceration NECK: supple, no JVD LYMPH:  no palpable lymphadenopathy in the cervical, axillary or inguinal regions LUNGS: clear to auscultation b/l with normal respiratory effort HEART: regular rate & rhythm ABDOMEN:  normoactive bowel sounds , non tender, not distended. Extremity: trace leg swelling PSYCH: alert & oriented x 3 with fluent speech NEURO: no focal motor/sensory deficits   LABORATORY DATA:   I have reviewed the data as listed  . CBC Latest Ref Rng & Units 04/12/2020 04/12/2019 08/26/2018  WBC 4.0 - 10.5 K/uL 5.8 5.7 6.5  Hemoglobin 13.0 - 17.0 g/dL 12.0(L) 12.6(L) 13.3  Hematocrit 39.0 -  52.0 % 37.7(L) 39.7 41.5  Platelets 150 - 400 K/uL 155 166 175    CBC    Component Value Date/Time   WBC 5.8 04/12/2020 1004   RBC 4.05 (L) 04/12/2020 1004   HGB 12.0 (L) 04/12/2020 1004   HGB 13.2 08/15/2017 0947   HGB 10.9 (L) 12/26/2016 1400   HCT 37.7 (L) 04/12/2020 1004   HCT 35.0 (L) 12/26/2016 1400   PLT 155 04/12/2020 1004  PLT 137 (L) 08/15/2017 0947   PLT 196 12/26/2016 1400   MCV 93.1 04/12/2020 1004   MCV 91.1 12/26/2016 1400   MCH 29.6 04/12/2020 1004   MCHC 31.8 04/12/2020 1004   RDW 13.6 04/12/2020 1004   RDW 14.6 12/26/2016 1400   LYMPHSABS 1.1 04/12/2020 1004   LYMPHSABS 1.5 12/26/2016 1400   MONOABS 0.6 04/12/2020 1004   MONOABS 0.4 12/26/2016 1400   EOSABS 0.0 04/12/2020 1004   EOSABS 0.1 12/26/2016 1400   BASOSABS 0.0 04/12/2020 1004   BASOSABS 0.0 12/26/2016 1400    . CMP Latest Ref Rng & Units 04/12/2020 04/12/2019 09/29/2018  Glucose 70 - 99 mg/dL 155(H) 156(H) 148(H)  BUN 8 - 23 mg/dL 19 18 21   Creatinine 0.61 - 1.24 mg/dL 1.38(H) 1.31(H) 1.40(H)  Sodium 135 - 145 mmol/L 139 142 140  Potassium 3.5 - 5.1 mmol/L 5.2(H) 4.7 5.1  Chloride 98 - 111 mmol/L 109 111 108(H)  CO2 22 - 32 mmol/L 21(L) 20(L) 19(L)  Calcium 8.9 - 10.3 mg/dL 8.6(L) 8.8(L) 9.5  Total Protein 6.5 - 8.1 g/dL 6.5 6.4(L) -  Total Bilirubin 0.3 - 1.2 mg/dL 0.5 0.4 -  Alkaline Phos 38 - 126 U/L 51 50 -  AST 15 - 41 U/L 21 18 -  ALT 0 - 44 U/L 21 25 -   . Lab Results  Component Value Date   IRON 71 04/12/2020   TIBC 298 04/12/2020   IRONPCTSAT 24 04/12/2020   (Iron and TIBC)  Lab Results  Component Value Date   FERRITIN 61 04/12/2019    Lab Results  Component Value Date   LDH 130 04/12/2019     RADIOGRAPHIC STUDIES: I have personally reviewed the radiological images as listed and agreed with the findings in the report.  Ct chest/abdpelvis12/2018: IMPRESSION: 1. Status post right hemicolectomy with circumferential wall thickening in the neo terminal ileum  extending into the ileocolic anastomosis and having asymmetric wall thickening in the proximal post anastomotic colon. There is pericolonic edema/inflammation tracking down the remaining segment of the colon to the rectum with interval increase in size of previously seen mesenteric lymph nodes adjacent to the anastomosis and interval development of multiple tiny perirectal nodules. These imaging features may be related to inflammatory bowel disease although infectious etiology neoplasm could have this appearance. 2. Apparent wall thickening in the rectum. 3. Potential pedunculated polyp in the neo terminal ileum. 4.  Aortic Atherosclerois (ICD10-170.0) 5. Multiple tiny pulmonary nodules in the lower lungs. Many of these were seen on the previous abdomen and pelvis CT and are stable in the interval. One particular 3 mm nodule in the left lower lobe was not included on the prior study and attention to this on follow-up imaging is recommended.  No results found.    ASSESSMENT & PLAN:   1.Follicular NHL Patient has never required treatment for this.  no obvious progression by CT [facility] 11-2015 -CT abd 11/27/2016 findings were likely related to his E Coli colonic infection and has clinically resolved with levofloxacin rx from our clinic. . Lab Results  Component Value Date   LDH 130 04/12/2019   2. Iron deficiency anemia previously related to GI bleeding from nonmalignant inflammatory anastomotic colon polyp.  Iron deficiency persistent. S/p IV injectafer x 2 injan-feb 2019. hgb has improved from 11 to 13  Lab Results  Component Value Date   FERRITIN 61 04/12/2019    3.remote history of synchronous colon cancers. Colonoscopy planned 12/08-2016 by Dr Amedeo Plenty, has  been performed. We do not have records yet, however, the patient reported that Dr Amedeo Plenty felt positive about his results. We will follow up on this.   CT abd -- showed inflammation due to E.coli  02/02/18 CT C/A/P  which revealed Status post right hemicolectomy. No findings suspicious for recurrent tumor. 2. Stable scattered bilateral pulmonary nodules but no new or progressive findings. 3. No findings for lymphadenopathy involving the chest, abdomen or pelvis. 4. Stable atherosclerotic calcifications involving the thoracic and abdominal aorta and branch vessels.  CEA 2.72 on 08/26/2018     PLAN: -Discussed pt labwork today, 04/12/2020; blood counts normal.  -Recommended again for pt to receive the COVID vaccines. The pt still does not wish to get this. -Recommend pt continue to keep skin moisturized and wear a compression sleeve to improve upper extremity bruising -no clinical or lab evidence of progression of FL or colon CA. -Will see back in 11 months with labs.   FOLLOW UP: Additional labs today RTC with Dr Irene Limbo with labs in 11 months    The total time spent in the appt was 20 minutes and more than 50% was on counseling and direct patient cares.  All of the patient's questions were answered with apparent satisfaction. The patient knows to call the clinic with any problems, questions or concerns.    Sullivan Lone MD [place] [facility] Hematology/Oncology Physician [facility]  (Office):       [phone] (Work cell):  [phone] (Fax):           [phone]   I, Reinaldo Raddle, am acting as scribe for Dr. Sullivan Lone, MD.    .I have reviewed the above documentation for accuracy and completeness, and I agree with the above. Brunetta Genera MD

## 2020-04-12 ENCOUNTER — Inpatient Hospital Stay: Payer: PPO | Admitting: Hematology

## 2020-04-12 ENCOUNTER — Inpatient Hospital Stay: Payer: PPO

## 2020-04-12 ENCOUNTER — Inpatient Hospital Stay: Payer: PPO | Attending: Hematology

## 2020-04-12 ENCOUNTER — Other Ambulatory Visit: Payer: Self-pay

## 2020-04-12 VITALS — BP 166/58 | HR 54 | Temp 97.0°F | Resp 18 | Ht 69.0 in | Wt 245.0 lb

## 2020-04-12 DIAGNOSIS — C8203 Follicular lymphoma grade I, intra-abdominal lymph nodes: Secondary | ICD-10-CM

## 2020-04-12 DIAGNOSIS — C8290 Follicular lymphoma, unspecified, unspecified site: Secondary | ICD-10-CM | POA: Diagnosis not present

## 2020-04-12 DIAGNOSIS — D509 Iron deficiency anemia, unspecified: Secondary | ICD-10-CM | POA: Diagnosis not present

## 2020-04-12 DIAGNOSIS — Z85038 Personal history of other malignant neoplasm of large intestine: Secondary | ICD-10-CM

## 2020-04-12 DIAGNOSIS — Z86010 Personal history of colonic polyps: Secondary | ICD-10-CM | POA: Diagnosis not present

## 2020-04-12 DIAGNOSIS — I252 Old myocardial infarction: Secondary | ICD-10-CM | POA: Diagnosis not present

## 2020-04-12 DIAGNOSIS — Z87891 Personal history of nicotine dependence: Secondary | ICD-10-CM | POA: Diagnosis not present

## 2020-04-12 LAB — IRON AND TIBC
Iron: 71 ug/dL (ref 42–163)
Saturation Ratios: 24 % (ref 20–55)
TIBC: 298 ug/dL (ref 202–409)
UIBC: 227 ug/dL (ref 117–376)

## 2020-04-12 LAB — CMP (CANCER CENTER ONLY)
ALT: 21 U/L (ref 0–44)
AST: 21 U/L (ref 15–41)
Albumin: 3.6 g/dL (ref 3.5–5.0)
Alkaline Phosphatase: 51 U/L (ref 38–126)
Anion gap: 9 (ref 5–15)
BUN: 19 mg/dL (ref 8–23)
CO2: 21 mmol/L — ABNORMAL LOW (ref 22–32)
Calcium: 8.6 mg/dL — ABNORMAL LOW (ref 8.9–10.3)
Chloride: 109 mmol/L (ref 98–111)
Creatinine: 1.38 mg/dL — ABNORMAL HIGH (ref 0.61–1.24)
GFR, Estimated: 53 mL/min — ABNORMAL LOW (ref >60)
Glucose, Bld: 155 mg/dL — ABNORMAL HIGH (ref 70–99)
Potassium: 5.2 mmol/L — ABNORMAL HIGH (ref 3.5–5.1)
Sodium: 139 mmol/L (ref 135–145)
Total Bilirubin: 0.5 mg/dL (ref 0.3–1.2)
Total Protein: 6.5 g/dL (ref 6.5–8.1)

## 2020-04-12 LAB — CEA (IN HOUSE-CHCC): CEA (CHCC-In House): 2.92 ng/mL (ref 0.00–5.00)

## 2020-04-12 LAB — CBC WITH DIFFERENTIAL/PLATELET
Abs Immature Granulocytes: 0.02 10*3/uL (ref 0.00–0.07)
Basophils Absolute: 0 10*3/uL (ref 0.0–0.1)
Basophils Relative: 1 %
Eosinophils Absolute: 0 10*3/uL (ref 0.0–0.5)
Eosinophils Relative: 1 %
HCT: 37.7 % — ABNORMAL LOW (ref 39.0–52.0)
Hemoglobin: 12 g/dL — ABNORMAL LOW (ref 13.0–17.0)
Immature Granulocytes: 0 %
Lymphocytes Relative: 19 %
Lymphs Abs: 1.1 10*3/uL (ref 0.7–4.0)
MCH: 29.6 pg (ref 26.0–34.0)
MCHC: 31.8 g/dL (ref 30.0–36.0)
MCV: 93.1 fL (ref 80.0–100.0)
Monocytes Absolute: 0.6 10*3/uL (ref 0.1–1.0)
Monocytes Relative: 10 %
Neutro Abs: 4.1 10*3/uL (ref 1.7–7.7)
Neutrophils Relative %: 69 %
Platelets: 155 10*3/uL (ref 150–400)
RBC: 4.05 MIL/uL — ABNORMAL LOW (ref 4.22–5.81)
RDW: 13.6 % (ref 11.5–15.5)
WBC: 5.8 10*3/uL (ref 4.0–10.5)
nRBC: 0 % (ref 0.0–0.2)

## 2020-04-12 LAB — FERRITIN: Ferritin: 51 ng/mL (ref 24–336)

## 2020-06-28 DIAGNOSIS — M1712 Unilateral primary osteoarthritis, left knee: Secondary | ICD-10-CM | POA: Diagnosis not present

## 2020-07-03 DIAGNOSIS — N1831 Chronic kidney disease, stage 3a: Secondary | ICD-10-CM | POA: Diagnosis not present

## 2020-07-03 DIAGNOSIS — N400 Enlarged prostate without lower urinary tract symptoms: Secondary | ICD-10-CM | POA: Diagnosis not present

## 2020-07-03 DIAGNOSIS — E1142 Type 2 diabetes mellitus with diabetic polyneuropathy: Secondary | ICD-10-CM | POA: Diagnosis not present

## 2020-07-03 DIAGNOSIS — K219 Gastro-esophageal reflux disease without esophagitis: Secondary | ICD-10-CM | POA: Diagnosis not present

## 2020-07-03 DIAGNOSIS — I10 Essential (primary) hypertension: Secondary | ICD-10-CM | POA: Diagnosis not present

## 2020-07-03 DIAGNOSIS — E782 Mixed hyperlipidemia: Secondary | ICD-10-CM | POA: Diagnosis not present

## 2020-07-03 DIAGNOSIS — M1990 Unspecified osteoarthritis, unspecified site: Secondary | ICD-10-CM | POA: Diagnosis not present

## 2020-07-06 DIAGNOSIS — E1142 Type 2 diabetes mellitus with diabetic polyneuropathy: Secondary | ICD-10-CM | POA: Diagnosis not present

## 2020-07-06 DIAGNOSIS — I10 Essential (primary) hypertension: Secondary | ICD-10-CM | POA: Diagnosis not present

## 2020-07-06 DIAGNOSIS — I2510 Atherosclerotic heart disease of native coronary artery without angina pectoris: Secondary | ICD-10-CM | POA: Diagnosis not present

## 2020-07-06 DIAGNOSIS — Z7984 Long term (current) use of oral hypoglycemic drugs: Secondary | ICD-10-CM | POA: Diagnosis not present

## 2020-07-06 DIAGNOSIS — N1831 Chronic kidney disease, stage 3a: Secondary | ICD-10-CM | POA: Diagnosis not present

## 2020-07-06 DIAGNOSIS — I739 Peripheral vascular disease, unspecified: Secondary | ICD-10-CM | POA: Diagnosis not present

## 2020-07-06 DIAGNOSIS — Z85038 Personal history of other malignant neoplasm of large intestine: Secondary | ICD-10-CM | POA: Diagnosis not present

## 2020-07-06 DIAGNOSIS — I700 Atherosclerosis of aorta: Secondary | ICD-10-CM | POA: Diagnosis not present

## 2020-07-06 DIAGNOSIS — K219 Gastro-esophageal reflux disease without esophagitis: Secondary | ICD-10-CM | POA: Diagnosis not present

## 2020-07-06 DIAGNOSIS — I252 Old myocardial infarction: Secondary | ICD-10-CM | POA: Diagnosis not present

## 2020-08-08 DIAGNOSIS — H2521 Age-related cataract, morgagnian type, right eye: Secondary | ICD-10-CM | POA: Diagnosis not present

## 2020-08-08 DIAGNOSIS — E113292 Type 2 diabetes mellitus with mild nonproliferative diabetic retinopathy without macular edema, left eye: Secondary | ICD-10-CM | POA: Diagnosis not present

## 2020-08-08 DIAGNOSIS — H211X1 Other vascular disorders of iris and ciliary body, right eye: Secondary | ICD-10-CM | POA: Diagnosis not present

## 2020-08-08 DIAGNOSIS — H353122 Nonexudative age-related macular degeneration, left eye, intermediate dry stage: Secondary | ICD-10-CM | POA: Diagnosis not present

## 2020-08-24 DIAGNOSIS — I2510 Atherosclerotic heart disease of native coronary artery without angina pectoris: Secondary | ICD-10-CM | POA: Diagnosis not present

## 2020-08-24 DIAGNOSIS — N400 Enlarged prostate without lower urinary tract symptoms: Secondary | ICD-10-CM | POA: Diagnosis not present

## 2020-08-24 DIAGNOSIS — Z85038 Personal history of other malignant neoplasm of large intestine: Secondary | ICD-10-CM | POA: Diagnosis not present

## 2020-08-24 DIAGNOSIS — H409 Unspecified glaucoma: Secondary | ICD-10-CM | POA: Diagnosis not present

## 2020-08-24 DIAGNOSIS — M1990 Unspecified osteoarthritis, unspecified site: Secondary | ICD-10-CM | POA: Diagnosis not present

## 2020-08-24 DIAGNOSIS — I10 Essential (primary) hypertension: Secondary | ICD-10-CM | POA: Diagnosis not present

## 2020-08-24 DIAGNOSIS — I252 Old myocardial infarction: Secondary | ICD-10-CM | POA: Diagnosis not present

## 2020-08-24 DIAGNOSIS — E1142 Type 2 diabetes mellitus with diabetic polyneuropathy: Secondary | ICD-10-CM | POA: Diagnosis not present

## 2020-08-24 DIAGNOSIS — C189 Malignant neoplasm of colon, unspecified: Secondary | ICD-10-CM | POA: Diagnosis not present

## 2020-08-24 DIAGNOSIS — K219 Gastro-esophageal reflux disease without esophagitis: Secondary | ICD-10-CM | POA: Diagnosis not present

## 2020-08-24 DIAGNOSIS — N183 Chronic kidney disease, stage 3 unspecified: Secondary | ICD-10-CM | POA: Diagnosis not present

## 2020-08-24 DIAGNOSIS — E782 Mixed hyperlipidemia: Secondary | ICD-10-CM | POA: Diagnosis not present

## 2020-11-02 DIAGNOSIS — H401131 Primary open-angle glaucoma, bilateral, mild stage: Secondary | ICD-10-CM | POA: Diagnosis not present

## 2020-12-19 DIAGNOSIS — H2101 Hyphema, right eye: Secondary | ICD-10-CM | POA: Diagnosis not present

## 2020-12-19 DIAGNOSIS — H401131 Primary open-angle glaucoma, bilateral, mild stage: Secondary | ICD-10-CM | POA: Diagnosis not present

## 2021-01-10 DIAGNOSIS — Z85038 Personal history of other malignant neoplasm of large intestine: Secondary | ICD-10-CM | POA: Diagnosis not present

## 2021-01-10 DIAGNOSIS — I10 Essential (primary) hypertension: Secondary | ICD-10-CM | POA: Diagnosis not present

## 2021-01-10 DIAGNOSIS — I739 Peripheral vascular disease, unspecified: Secondary | ICD-10-CM | POA: Diagnosis not present

## 2021-01-10 DIAGNOSIS — N1831 Chronic kidney disease, stage 3a: Secondary | ICD-10-CM | POA: Diagnosis not present

## 2021-01-10 DIAGNOSIS — N400 Enlarged prostate without lower urinary tract symptoms: Secondary | ICD-10-CM | POA: Diagnosis not present

## 2021-01-10 DIAGNOSIS — I779 Disorder of arteries and arterioles, unspecified: Secondary | ICD-10-CM | POA: Diagnosis not present

## 2021-01-10 DIAGNOSIS — I252 Old myocardial infarction: Secondary | ICD-10-CM | POA: Diagnosis not present

## 2021-01-10 DIAGNOSIS — Z0000 Encounter for general adult medical examination without abnormal findings: Secondary | ICD-10-CM | POA: Diagnosis not present

## 2021-01-10 DIAGNOSIS — M1990 Unspecified osteoarthritis, unspecified site: Secondary | ICD-10-CM | POA: Diagnosis not present

## 2021-01-10 DIAGNOSIS — E782 Mixed hyperlipidemia: Secondary | ICD-10-CM | POA: Diagnosis not present

## 2021-01-10 DIAGNOSIS — I2510 Atherosclerotic heart disease of native coronary artery without angina pectoris: Secondary | ICD-10-CM | POA: Diagnosis not present

## 2021-01-10 DIAGNOSIS — Z1389 Encounter for screening for other disorder: Secondary | ICD-10-CM | POA: Diagnosis not present

## 2021-01-10 DIAGNOSIS — E1142 Type 2 diabetes mellitus with diabetic polyneuropathy: Secondary | ICD-10-CM | POA: Diagnosis not present

## 2021-02-13 DIAGNOSIS — E113292 Type 2 diabetes mellitus with mild nonproliferative diabetic retinopathy without macular edema, left eye: Secondary | ICD-10-CM | POA: Diagnosis not present

## 2021-02-13 DIAGNOSIS — H2521 Age-related cataract, morgagnian type, right eye: Secondary | ICD-10-CM | POA: Diagnosis not present

## 2021-02-13 DIAGNOSIS — H353124 Nonexudative age-related macular degeneration, left eye, advanced atrophic with subfoveal involvement: Secondary | ICD-10-CM | POA: Diagnosis not present

## 2021-02-13 DIAGNOSIS — H211X1 Other vascular disorders of iris and ciliary body, right eye: Secondary | ICD-10-CM | POA: Diagnosis not present

## 2021-02-22 DIAGNOSIS — H2101 Hyphema, right eye: Secondary | ICD-10-CM | POA: Diagnosis not present

## 2021-02-22 DIAGNOSIS — H401131 Primary open-angle glaucoma, bilateral, mild stage: Secondary | ICD-10-CM | POA: Diagnosis not present

## 2021-03-13 ENCOUNTER — Other Ambulatory Visit: Payer: Self-pay

## 2021-03-13 DIAGNOSIS — Z85038 Personal history of other malignant neoplasm of large intestine: Secondary | ICD-10-CM

## 2021-03-13 DIAGNOSIS — C8203 Follicular lymphoma grade I, intra-abdominal lymph nodes: Secondary | ICD-10-CM

## 2021-03-13 DIAGNOSIS — D509 Iron deficiency anemia, unspecified: Secondary | ICD-10-CM

## 2021-03-14 ENCOUNTER — Inpatient Hospital Stay: Payer: PPO | Admitting: Hematology

## 2021-03-14 ENCOUNTER — Other Ambulatory Visit: Payer: Self-pay

## 2021-03-14 ENCOUNTER — Inpatient Hospital Stay: Payer: PPO | Attending: Hematology

## 2021-03-14 VITALS — BP 144/64 | HR 61 | Temp 98.0°F | Resp 18 | Ht 69.0 in | Wt 243.5 lb

## 2021-03-14 DIAGNOSIS — Z8572 Personal history of non-Hodgkin lymphomas: Secondary | ICD-10-CM | POA: Insufficient documentation

## 2021-03-14 DIAGNOSIS — Z85038 Personal history of other malignant neoplasm of large intestine: Secondary | ICD-10-CM | POA: Diagnosis not present

## 2021-03-14 DIAGNOSIS — C8203 Follicular lymphoma grade I, intra-abdominal lymph nodes: Secondary | ICD-10-CM | POA: Diagnosis not present

## 2021-03-14 DIAGNOSIS — N189 Chronic kidney disease, unspecified: Secondary | ICD-10-CM | POA: Diagnosis not present

## 2021-03-14 DIAGNOSIS — I252 Old myocardial infarction: Secondary | ICD-10-CM | POA: Insufficient documentation

## 2021-03-14 DIAGNOSIS — K5140 Inflammatory polyps of colon without complications: Secondary | ICD-10-CM | POA: Diagnosis not present

## 2021-03-14 DIAGNOSIS — D509 Iron deficiency anemia, unspecified: Secondary | ICD-10-CM | POA: Diagnosis not present

## 2021-03-14 DIAGNOSIS — Z87891 Personal history of nicotine dependence: Secondary | ICD-10-CM | POA: Insufficient documentation

## 2021-03-14 LAB — CMP (CANCER CENTER ONLY)
ALT: 22 U/L (ref 0–44)
AST: 22 U/L (ref 15–41)
Albumin: 3.9 g/dL (ref 3.5–5.0)
Alkaline Phosphatase: 52 U/L (ref 38–126)
Anion gap: 6 (ref 5–15)
BUN: 26 mg/dL — ABNORMAL HIGH (ref 8–23)
CO2: 21 mmol/L — ABNORMAL LOW (ref 22–32)
Calcium: 9.2 mg/dL (ref 8.9–10.3)
Chloride: 111 mmol/L (ref 98–111)
Creatinine: 1.49 mg/dL — ABNORMAL HIGH (ref 0.61–1.24)
GFR, Estimated: 48 mL/min — ABNORMAL LOW (ref >60)
Glucose, Bld: 168 mg/dL — ABNORMAL HIGH (ref 70–99)
Potassium: 5 mmol/L (ref 3.5–5.1)
Sodium: 138 mmol/L (ref 135–145)
Total Bilirubin: 0.5 mg/dL (ref 0.3–1.2)
Total Protein: 6.2 g/dL — ABNORMAL LOW (ref 6.5–8.1)

## 2021-03-14 LAB — CBC WITH DIFFERENTIAL (CANCER CENTER ONLY)
Abs Immature Granulocytes: 0.02 10*3/uL (ref 0.00–0.07)
Basophils Absolute: 0 10*3/uL (ref 0.0–0.1)
Basophils Relative: 1 %
Eosinophils Absolute: 0.1 10*3/uL (ref 0.0–0.5)
Eosinophils Relative: 1 %
HCT: 36.2 % — ABNORMAL LOW (ref 39.0–52.0)
Hemoglobin: 11.4 g/dL — ABNORMAL LOW (ref 13.0–17.0)
Immature Granulocytes: 0 %
Lymphocytes Relative: 19 %
Lymphs Abs: 1 10*3/uL (ref 0.7–4.0)
MCH: 28.9 pg (ref 26.0–34.0)
MCHC: 31.5 g/dL (ref 30.0–36.0)
MCV: 91.6 fL (ref 80.0–100.0)
Monocytes Absolute: 0.4 10*3/uL (ref 0.1–1.0)
Monocytes Relative: 8 %
Neutro Abs: 3.7 10*3/uL (ref 1.7–7.7)
Neutrophils Relative %: 71 %
Platelet Count: 154 10*3/uL (ref 150–400)
RBC: 3.95 MIL/uL — ABNORMAL LOW (ref 4.22–5.81)
RDW: 14.5 % (ref 11.5–15.5)
WBC Count: 5.3 10*3/uL (ref 4.0–10.5)
nRBC: 0 % (ref 0.0–0.2)

## 2021-03-14 LAB — IRON AND IRON BINDING CAPACITY (CC-WL,HP ONLY)
Iron: 77 ug/dL (ref 45–182)
Saturation Ratios: 24 % (ref 17.9–39.5)
TIBC: 328 ug/dL (ref 250–450)
UIBC: 251 ug/dL (ref 117–376)

## 2021-03-14 LAB — FERRITIN: Ferritin: 22 ng/mL — ABNORMAL LOW (ref 24–336)

## 2021-03-14 LAB — CEA (IN HOUSE-CHCC): CEA (CHCC-In House): 2.56 ng/mL (ref 0.00–5.00)

## 2021-03-20 ENCOUNTER — Encounter: Payer: Self-pay | Admitting: Hematology

## 2021-03-20 NOTE — Progress Notes (Signed)
. ? ?HEMATOLOGY ONCOLOGY PROGRESS NOTE ? ?Date of service: 03/20/21   ? ?Patient Care Team: ?Wenda Low, MD as PCP - General (Internal Medicine) ?Belva Crome, MD as PCP - Cardiology (Cardiology) ? ?CC: ?Follow-up for history of colon cancer and follicle lymphoma ? ?INTERVAL HISTORY: ? ?[name]  is here for continued evaluation and management of follicular lymphoma and a remote history of colon cancer. ?He notes that he has been doing well overall and is only limited by his arthritis. ?Notes no acute new symptoms since his last clinic visit. ?No new lumps or bumps.  No fevers no chills no night sweats.  No unexpected weight loss. ?No significant change in bowel habits. ? ?Labs done today were reviewed in detail with the patient. ? ?REVIEW OF SYSTEMS:   ?10 Point review of Systems was done is negative except as noted above. ? ?. ?Past Medical History:  ?Diagnosis Date  ? Anemia, iron deficiency 01/26/2011  ? Arthritis   ? Asthma   ? Cancer [facility])   ? nhl, colon ca  ? Carotid artery occlusion   ? right occlusion of carotid with moderate left plaque  ? Coronary artery disease August 2005  ? MI with stent  ? Diabetes mellitus   ? Hyperlipidemia   ? Hypertension   ? Kidney stones 05/2010  ? lithotripsy  ? Myocardial infarction [facility])   ? ? ?. ?Past Surgical History:  ?Procedure Laterality Date  ? CARPAL TUNNEL RELEASE Left 05/08/2015  ? Procedure: CARPAL TUNNEL RELEASE;  Surgeon: Charlotte Crumb, MD;  Location: [place];  Service: Orthopedics;  Laterality: Left;  ? COLON SURGERY    ? COLONOSCOPY    ? CORONARY STENT PLACEMENT    ? HERNIA REPAIR    ? OPEN REDUCTION INTERNAL FIXATION (ORIF) DISTAL RADIAL FRACTURE Left 05/08/2015  ? Procedure: OPEN REDUCTION INTERNAL FIXATION (ORIF) DISTAL RADIAL FRACTURE, CARPAL TUNNEL RELEASE;  Surgeon: Charlotte Crumb, MD;  Location: [place];  Service: Orthopedics;  Laterality: Left;  ? REPAIR OF COMPLEX TRACTION RETINAL DETACHMENT    ? ? ?. ?Social History  ? ?Tobacco Use   ? Smoking status: Former  ?  Packs/day: 1.00  ?  Years: 20.00  ?  Pack years: 20.00  ?  Types: Cigarettes  ?  Quit date: 01/08/1980  ?  Years since quitting: 41.2  ? Smokeless tobacco: Never  ?Vaping Use  ? Vaping Use: Never used  ?Substance Use Topics  ? Alcohol use: No  ?  Alcohol/week: 0.0 standard drinks  ? Drug use: No  ? ? ?ALLERGIES:  is allergic to iodine, iodinated contrast media, and lisinopril. ? ?MEDICATIONS:  ?Current Outpatient Medications  ?Medication Sig Dispense Refill  ? Ascorbic Acid (VITAMIN C PO) Take 1 tablet by mouth daily.    ? aspirin 81 MG tablet Take 81 mg by mouth daily.    ? atorvastatin (LIPITOR) 40 MG tablet Take 40 mg by mouth daily.    ? clopidogrel (PLAVIX) 75 MG tablet Take 75 mg by mouth daily.    ? Coenzyme Q10 (CO Q 10 PO) Take 1 capsule by mouth daily.     ? dorzolamide-timolol (COSOPT) 22.3-6.8 MG/ML ophthalmic solution Place 1 drop into both eyes 2 (two) times daily.    ? glucosamine-chondroitin 500-400 MG tablet Take 1 tablet by mouth daily.     ? losartan (COZAAR) 100 MG tablet Take 100 mg by mouth daily.    ? Lutein 10 MG TABS Take 1 tablet by mouth  daily.     ? LYCOPENE PO Take 1 tablet by mouth daily.     ? metFORMIN (GLUCOPHAGE) 500 MG tablet Take 1,000 mg by mouth 2 (two) times daily with a meal.    ? metoprolol (LOPRESSOR) 50 MG tablet Take 25 mg by mouth 2 (two) times daily.    ? Multiple Vitamins-Minerals (ZINC PO) Take 1 tablet by mouth daily.    ? nitroGLYCERIN (NITROSTAT) 0.4 MG SL tablet Place 0.4 mg under the tongue every 5 (five) minutes x 3 doses as needed for chest pain.    ? Omega-3 Fatty Acids (FISH OIL) 1000 MG CAPS Take 1 capsule by mouth daily.    ? pantoprazole (PROTONIX) 40 MG tablet Take 40 mg by mouth daily.     ? sitaGLIPtin (JANUVIA) 100 MG tablet Take 100 mg by mouth daily.    ? VITAMIN D PO Take 1 tablet by mouth daily.    ? ?No current facility-administered medications for this visit.  ? ? ?PHYSICAL EXAMINATION: ?ECOG PERFORMANCE STATUS: 2 -  Symptomatic, <50% confined to bed ? ?Vitals:  ? 03/14/21 0926  ?BP: (!) 144/64  ?Pulse: 61  ?Resp: 18  ?Temp: 98 ?F (36.7 ?C)  ?SpO2: 98%  ? ? ?Filed Weights  ? 03/14/21 0926  ?Weight: 243 lb 8 oz (110.5 kg)  ? ?.Body mass index is 35.96 kg/m?Marland Kitchen  ?NAD ?GENERAL:alert, in no acute distress and comfortable ?SKIN: no acute rashes, no significant lesions ?EYES: conjunctiva are pink and non-injected, sclera anicteric ?OROPHARYNX: MMM, no exudates, no oropharyngeal erythema or ulceration ?NECK: supple, no JVD ?LYMPH:  no palpable lymphadenopathy in the cervical, axillary or inguinal regions ?LUNGS: clear to auscultation b/l with normal respiratory effort ?HEART: regular rate & rhythm ?ABDOMEN:  normoactive bowel sounds , non tender, not distended. ?Extremity: no pedal edema ?PSYCH: alert & oriented x 3 with fluent speech ?NEURO: no focal motor/sensory deficits ? ? ?LABORATORY DATA:  ? ?I have reviewed the data as listed ? ?. ?CBC Latest Ref Rng & Units 03/14/2021 04/12/2020 04/12/2019  ?WBC 4.0 - 10.5 K/uL 5.3 5.8 5.7  ?Hemoglobin 13.0 - 17.0 g/dL 11.4(L) 12.0(L) 12.6(L)  ?Hematocrit 39.0 - 52.0 % 36.2(L) 37.7(L) 39.7  ?Platelets 150 - 400 K/uL 154 155 166  ? ? ?CBC ?   ?Component Value Date/Time  ? WBC 5.3 03/14/2021 0855  ? WBC 5.8 04/12/2020 1004  ? RBC 3.95 (L) 03/14/2021 0855  ? HGB 11.4 (L) 03/14/2021 0855  ? HGB 10.9 (L) 12/26/2016 1400  ? HCT 36.2 (L) 03/14/2021 0855  ? HCT 35.0 (L) 12/26/2016 1400  ? PLT 154 03/14/2021 0855  ? PLT 196 12/26/2016 1400  ? MCV 91.6 03/14/2021 0855  ? MCV 91.1 12/26/2016 1400  ? MCH 28.9 03/14/2021 0855  ? MCHC 31.5 03/14/2021 0855  ? RDW 14.5 03/14/2021 0855  ? RDW 14.6 12/26/2016 1400  ? LYMPHSABS 1.0 03/14/2021 0855  ? LYMPHSABS 1.5 12/26/2016 1400  ? MONOABS 0.4 03/14/2021 0855  ? MONOABS 0.4 12/26/2016 1400  ? EOSABS 0.1 03/14/2021 0855  ? EOSABS 0.1 12/26/2016 1400  ? BASOSABS 0.0 03/14/2021 0855  ? BASOSABS 0.0 12/26/2016 1400  ? ? ?. ?CMP Latest Ref Rng & Units 03/14/2021 04/12/2020  04/12/2019  ?Glucose 70 - 99 mg/dL 168(H) 155(H) 156(H)  ?BUN 8 - 23 mg/dL 26(H) 19 18  ?Creatinine 0.61 - 1.24 mg/dL 1.49(H) 1.38(H) 1.31(H)  ?Sodium 135 - 145 mmol/L 138 139 142  ?Potassium 3.5 - 5.1 mmol/L 5.0 5.2(H) 4.7  ?Chloride  98 - 111 mmol/L 111 109 111  ?CO2 22 - 32 mmol/L 21(L) 21(L) 20(L)  ?Calcium 8.9 - 10.3 mg/dL 9.2 8.6(L) 8.8(L)  ?Total Protein 6.5 - 8.1 g/dL 6.2(L) 6.5 6.4(L)  ?Total Bilirubin 0.3 - 1.2 mg/dL 0.5 0.5 0.4  ?Alkaline Phos 38 - 126 U/L 52 51 50  ?AST 15 - 41 U/L '22 21 18  '$ ?ALT 0 - 44 U/L '22 21 25  '$ ? ?. ?Lab Results  ?Component Value Date  ? IRON 77 03/14/2021  ? TIBC 328 03/14/2021  ? IRONPCTSAT 24 03/14/2021  ? ?(Iron and TIBC) ? ?Lab Results  ?Component Value Date  ? FERRITIN 22 (L) 03/14/2021  ? ? ? ? ?RADIOGRAPHIC STUDIES: ?I have personally reviewed the radiological images as listed and agreed with the findings in the report. ?  ? ?ASSESSMENT & PLAN:  ? ?1.Follicular NHL ?Patient has never required treatment for this. ? no obvious progression by CT [facility] 11-2015 ?-CT abd 11/27/2016 findings were likely related to his E Coli colonic infection and has clinically resolved with levofloxacin rx from our clinic. ?. ?Lab Results  ?Component Value Date  ? LDH 130 04/12/2019  ? ?2. Iron deficiency anemia previously related to GI bleeding from nonmalignant inflammatory anastomotic colon polyp.  ?Iron deficiency persistent. ?S/p IV injectafer x 2 injan-feb 2019. ? ?Lab Results  ?Component Value Date  ? FERRITIN 22 (L) 03/14/2021  ? ?3.remote history of synchronous colon cancers.  ? ?02/02/18 CT C/A/P which revealed Status post right hemicolectomy. No findings suspicious for recurrent tumor. 2. Stable scattered bilateral pulmonary nodules but no new or progressive findings. 3. No findings for lymphadenopathy involving the chest, abdomen or pelvis. 4. Stable atherosclerotic calcifications involving the thoracic and abdominal aorta and branch vessels. ? ?CEA 2.72 on 08/26/2018 ?   ?PLAN: ?-Patient has no clinical symptoms suggestive of progression of follicular lymphoma or recurrence of colon cancer. ?-Labs done today were reviewed with the patient in details ?CBC shows hemoglobin of 11.4

## 2021-03-23 DIAGNOSIS — M1712 Unilateral primary osteoarthritis, left knee: Secondary | ICD-10-CM | POA: Diagnosis not present

## 2021-04-02 ENCOUNTER — Telehealth: Payer: Self-pay

## 2021-04-02 NOTE — Telephone Encounter (Signed)
? ?  Pre-operative Risk Assessment  ?  ?Patient Name: Mark West.  ?DOB: 05/23/1940 ?MRN: [id number]  ? ?  ? ?Request for Surgical Clearance   ? ?Procedure:   LEFT TKA ? ?Date of Surgery:  Clearance 05/15/21                              ?   ?Surgeon:  DR. MATTHEW OLIN ?Surgeon's Group or Practice Name:  [facility] ?Phone number:  [phone] ?Fax number:  [phone] ?  ?Type of Clearance Requested:   ?- Medical  ?- Pharmacy:  Hold Aspirin and Clopidogrel (Plavix) INSTRUCTIONS WHEN TO HOLD ?  ?Type of Anesthesia:  Spinal ?  ?Additional requests/questions:   ? ?Signed, ?Jacinta Shoe   ?04/02/2021, 4:14 PM  ? ?

## 2021-04-03 NOTE — Telephone Encounter (Signed)
? ?  Name: Mark West.  ?DOB: August 02, 1940  ?MRN: [id number] ? ?Primary Cardiologist: Sinclair Grooms, MD ? ?Chart reviewed as part of pre-operative protocol coverage. Because of Mark SCHRECK Jr.'s past medical history and time since last visit, he will require a follow-up visit in order to better assess preoperative cardiovascular risk. Last OV 02/2020 (greater than 1 year ago) so needs visit. ? ?Pre-op covering staff: ?- Please schedule appointment and call patient to inform them. No pending appts on file for this patient at this time. ?- Please contact requesting surgeon's office via preferred method (i.e, phone, fax) to inform them of need for appointment prior to surgery. ? ?This message will also be routed to Dr. Tamala Julian to inquire whether patient may hold BOTH aspirin and Plavix in preparation for knee surgery if cleared at upcoming visit. Dr. Tamala Julian, you had already previously cleared the patient to hold Plavix for 5-7 days per Karilyn Cota documentation in 2018 but need the OK to also hold ASA as well per request here. Patient has hx of subclavian artery stenosis, HTN, HLD, CAD s/p stent in 2005 (unknown location, negative nuc 2019), carotid artery stenosis (total occlusion on the right, 1-39% on the left by duplex 02/2020), DM, HFpEF, follicular lymphoma, and blindness in right eye. Thank you. ? ?Charlie Pitter, PA-C  ?04/03/2021, 11:30 AM  ? ?

## 2021-04-03 NOTE — Telephone Encounter (Signed)
Spoke with patient who is agreeable to see Robbie Lis, PA-C on 3/31 @ 1:55 pm. Patient thanked me for the call and verbalized understanding.  ?

## 2021-04-03 NOTE — Telephone Encounter (Signed)
Dr. Thompson Caul recommendation now available for review at upcoming televisit (see commentary below on plavix). Will remove from preop box. ?

## 2021-04-06 ENCOUNTER — Ambulatory Visit: Payer: PPO | Admitting: Physician Assistant

## 2021-04-06 NOTE — Progress Notes (Deleted)
?Cardiology Office Note:   ? ?Date:  04/06/2021  ? ?ID:  Mark West., DOB 1940/01/30, MRN [id number] ? ?PCP:  Wenda Low, MD  ?[facility] Cardiologist:  Sinclair Grooms, MD  ?[facility] Electrophysiologist:  None  ? ?Chief Complaint: Surgical clearance for LEFT TKA ? ?History of Present Illness:   ? ?Mark West. is a 81 y.o. male with a hx of  subclavian artery stenosis, HTN, HLD, CAD s/p stent in 2005 (unknown location, negative nuc 2019), carotid artery stenosis (total occlusion on the right, 1-39% on the left by duplex 02/2020), DM, HFpEF, follicular lymphoma, and blindness in right eye seen for follow up. ? ?Low risk stress test 09/2017. ? ?Per Melina Copa message 04/03/21  "Dr. Tamala Julian, you had already previously cleared the patient to hold Plavix for 5-7 days per Karilyn Cota documentation in 2018 but need the OK to also hold ASA as well per request here" ? ?Per Dr. Tamala Julian "Okay to hold aspirin".  ? ?Here today for surgical clearance  ?Past Medical History:  ?Diagnosis Date  ? Anemia, iron deficiency 01/26/2011  ? Arthritis   ? Asthma   ? Cancer [facility])   ? nhl, colon ca  ? Carotid artery occlusion   ? right occlusion of carotid with moderate left plaque  ? Coronary artery disease August 2005  ? MI with stent  ? Diabetes mellitus   ? Hyperlipidemia   ? Hypertension   ? Kidney stones 05/2010  ? lithotripsy  ? Myocardial infarction [facility])   ? ? ?Past Surgical History:  ?Procedure Laterality Date  ? CARPAL TUNNEL RELEASE Left 05/08/2015  ? Procedure: CARPAL TUNNEL RELEASE;  Surgeon: Charlotte Crumb, MD;  Location: [place];  Service: Orthopedics;  Laterality: Left;  ? COLON SURGERY    ? COLONOSCOPY    ? CORONARY STENT PLACEMENT    ? HERNIA REPAIR    ? OPEN REDUCTION INTERNAL FIXATION (ORIF) DISTAL RADIAL FRACTURE Left 05/08/2015  ? Procedure: OPEN REDUCTION INTERNAL FIXATION (ORIF) DISTAL RADIAL FRACTURE, CARPAL TUNNEL RELEASE;  Surgeon: Charlotte Crumb, MD;  Location: [place];  Service:  Orthopedics;  Laterality: Left;  ? REPAIR OF COMPLEX TRACTION RETINAL DETACHMENT    ? ? ?Current Medications: ?No outpatient medications have been marked as taking for the 04/06/21 encounter (Appointment) with Leanor Kail, [place].  ?  ? ?Allergies:   Iodine, Iodinated contrast media, and Lisinopril  ? ?Social History  ? ?Socioeconomic History  ? Marital status: Married  ?  Spouse name: Not on file  ? Number of children: Not on file  ? Years of education: Not on file  ? Highest education level: Not on file  ?Occupational History  ? Not on file  ?Tobacco Use  ? Smoking status: Former  ?  Packs/day: 1.00  ?  Years: 20.00  ?  Pack years: 20.00  ?  Types: Cigarettes  ?  Quit date: 01/08/1980  ?  Years since quitting: 41.2  ? Smokeless tobacco: Never  ?Vaping Use  ? Vaping Use: Never used  ?Substance and Sexual Activity  ? Alcohol use: No  ?  Alcohol/week: 0.0 standard drinks  ? Drug use: No  ? Sexual activity: Not on file  ?Other Topics Concern  ? Not on file  ?Social History Narrative  ? Not on file  ? ?Social Determinants of Health  ? ?Financial Resource Strain: Not on file  ?Food Insecurity: Not on file  ?Transportation Needs: Not on file  ?Physical Activity: Not  on file  ?Stress: Not on file  ?Social Connections: Not on file  ?  ? ?Family History: ?The patient's family history includes Cancer in his brother, father, maternal aunt, and mother; Healthy in his sister.   ? ?ROS:   ?Please see the history of present illness.    ?All other systems reviewed and are negative.  ? ?EKGs/Labs/Other Studies Reviewed:   ? ?The following studies were reviewed today: ? ?Carotid doppler 02/2020 ?Summary:  ?Right Carotid: Evidence consistent with a total occlusion of the right  ?ICA.  ? ?Left Carotid: Velocities in the left ICA are consistent with a 1-39%  ?stenosis.  ?              [place] LICA with plaque, proximally, stable velocities  ?              compared to previous.  ? ?Vertebrals:  Bilateral vertebral arteries  demonstrate antegrade flow.  ?Subclavians: Normal flow hemodynamics were seen in bilateral subclavian  ?             arteries.  ? ?Lexiscan Stress test 10/02/2017:  ?  ?Nuclear stress EF: 67%. ?There was no ST segment deviation noted during stress. ?The study is normal. ?This is a low risk study. ?The left ventricular ejection fraction is hyperdynamic (>65%). ?  ?Normal resting and stress perfusion. No ischemia or infarction EF ?67% but appears to be LVE ? ?EKG:  EKG is *** ordered today.  The ekg ordered today demonstrates *** ? ?Recent Labs: ?03/14/2021: ALT 22; BUN 26; Creatinine 1.49; Hemoglobin 11.4; Platelet Count 154; Potassium 5.0; Sodium 138  ?Recent Lipid Panel ?   ?Component Value Date/Time  ? CHOL 108 03/01/2020 0758  ? TRIG 47 03/01/2020 0758  ? HDL 34 (L) 03/01/2020 0758  ? CHOLHDL 3.2 03/01/2020 0758  ? CHOLHDL 4.5 05/08/2005 1216  ? VLDL 19 05/08/2005 1216  ? [place] 62 03/01/2020 0758  ? ? ? ?Risk Assessment/Calculations:   ?{Does this patient have ATRIAL FIBRILLATION?:[phone]} ? ? ?Physical Exam:   ? ?VS:  There were no vitals taken for this visit.   ? ?Wt Readings from Last 3 Encounters:  ?03/14/21 243 lb 8 oz (110.5 kg)  ?04/12/20 245 lb (111.1 kg)  ?02/28/20 248 lb 6.4 oz (112.7 kg)  ?  ? ?GEN: *** Well nourished, well developed in no acute distress ?HEENT: Normal ?NECK: No JVD; No carotid bruits ?LYMPHATICS: No lymphadenopathy ?CARDIAC: ***RRR, no murmurs, rubs, gallops ?RESPIRATORY:  Clear to auscultation without rales, wheezing or rhonchi  ?ABDOMEN: Soft, non-tender, non-distended ?MUSCULOSKELETAL:  No edema; No deformity  ?SKIN: Warm and dry ?NEUROLOGIC:  Alert and oriented x 3 ?PSYCHIATRIC:  Normal affect  ? ?ASSESSMENT AND PLAN:  ? ? ?CAD ? ?2. Carotid artery dz ?- total occlusion on the right, 1-39% on the left by duplex 02/2020 (Stable) ?- Continue statin and antiplatelet therapy  ? ?3. HTN ? ?4. HLD ?-No results found for requested labs within last 8760 hours.  ? ?5. Surgical clearance   ? ?Medication Adjustments/Labs and Tests Ordered: ?Current medicines are reviewed at length with the patient today.  Concerns regarding medicines are outlined above.  ?No orders of the defined types were placed in this encounter. ? ?No orders of the defined types were placed in this encounter. ? ? ?There are no Patient Instructions on file for this visit.  ? ?Signed, ?Leanor Kail, [place]  ?04/06/2021 11:29 AM    ?[place] ?

## 2021-04-17 DIAGNOSIS — M179 Osteoarthritis of knee, unspecified: Secondary | ICD-10-CM | POA: Diagnosis not present

## 2021-04-17 DIAGNOSIS — N1831 Chronic kidney disease, stage 3a: Secondary | ICD-10-CM | POA: Diagnosis not present

## 2021-04-17 DIAGNOSIS — E1142 Type 2 diabetes mellitus with diabetic polyneuropathy: Secondary | ICD-10-CM | POA: Diagnosis not present

## 2021-04-17 DIAGNOSIS — I10 Essential (primary) hypertension: Secondary | ICD-10-CM | POA: Diagnosis not present

## 2021-04-17 DIAGNOSIS — Z01818 Encounter for other preprocedural examination: Secondary | ICD-10-CM | POA: Diagnosis not present

## 2021-04-17 NOTE — Progress Notes (Signed)
? ? ?Office Visit  ?  ?Patient Name: Mark West. ?Date of Encounter: 04/18/2021 ? ?Primary Care Provider:  Wenda Low, MD ?Primary Cardiologist:  Sinclair Grooms, MD ?Primary Electrophysiologist: None ?Chief Complaint  ?  ?Preoperative clearance for total knee replacement ? ? Patient Profile: ?HTN ?HLD ?CAD s/p DES in 2005 ?HFpEF ?DM ?Carotid artery stenosis (occluded right ICA, left ICA with 40-59%) ?Follicular lymphoma ? ? Recent Studies: ?09/2017 Lexiscan stress test: Negative for ischemia ?03/01/2020: Carotid artery duplex study: Right ICA totally occluded, left ICA with 1-39% stenosis and stable velocities.  Recommendation for follow-up study in 24 months ? ?History of Present Illness  ?  ?Mark Nickel. is a 81 y.o. male with PMH of HTN, HLD, DM, HFpEF, follicular lymphoma, CAD s/p DES and 2005.  Patient was last seen by Fabian Sharp, PA on 2/22 for preop clearance for colonoscopy.  During appointment patient's blood pressure was elevated, and had recently had a mechanical fall at church where he did not lose consciousness but injured his left knee.  Patient was however able to complete 4.0 METS without angina.  Patient had Dopplers completed for carotid artery stenosis with results of no further progression of disease. ? ? ?Since last being seen in our clinic Mark West reports doing he is feeling well with only complaint of his left knee and arthritic pain.  [place]  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 Patient presents today for preoperative clearance for total knee replacement. He denies chest pain, palpitations, dyspnea, PND, orthopnea, nausea, vomiting, dizziness, syncope, edema, weight gain, or early satiety.  ? ?Past Medical History  ?  ?Past Medical History:  ?Diagnosis Date  ? Anemia, iron deficiency 01/26/2011  ? Arthritis   ? Asthma   ? Cancer  [facility])   ? nhl, colon ca  ? Carotid artery occlusion   ? right occlusion of carotid with moderate left plaque  ? Coronary artery disease August 2005  ? MI with stent  ? Diabetes mellitus   ? Hyperlipidemia   ? Hypertension   ? Kidney stones 05/2010  ? lithotripsy  ? Myocardial infarction [facility])   ? ?Past Surgical History:  ?Procedure Laterality Date  ? CARPAL TUNNEL RELEASE Left 05/08/2015  ? Procedure: CARPAL TUNNEL RELEASE;  Surgeon: Charlotte Crumb, MD;  Location: [facility];  Service: Orthopedics;  Laterality: Left;  ? COLON SURGERY    ? COLONOSCOPY    ? CORONARY STENT PLACEMENT    ? HERNIA REPAIR    ? OPEN REDUCTION INTERNAL FIXATION (ORIF) DISTAL RADIAL FRACTURE Left 05/08/2015  ? Procedure: OPEN REDUCTION INTERNAL FIXATION (ORIF) DISTAL RADIAL FRACTURE, CARPAL TUNNEL RELEASE;  Surgeon: Charlotte Crumb, MD;  Location: [facility];  Service: Orthopedics;  Laterality: Left;  ? REPAIR OF COMPLEX TRACTION RETINAL DETACHMENT    ? ?Allergies ? ?Allergies  ?Allergen Reactions  ? Iodine Other (See Comments)  ?   EYE SWELLING WITH CONTRAST MEDIA  ? Iodinated Contrast Media Other (See Comments)  ? Lisinopril Other (See Comments)  ? ?Home Medications  ?  ?Current Outpatient Medications  ?Medication Sig Dispense Refill  ? aspirin 81 MG tablet Take 81 mg by mouth daily.    ? atorvastatin (LIPITOR) 40 MG tablet Take 40 mg by mouth daily.    ? clopidogrel (PLAVIX) 75 MG tablet Take 75 mg by mouth daily.    ?  Coenzyme Q10 (CO Q 10 PO) Take 1 capsule by mouth daily.     ? dorzolamide-timolol (COSOPT) 22.3-6.8 MG/ML ophthalmic solution Place 1 drop into both eyes 2 (two) times daily.    ? glucosamine-chondroitin 500-400 MG tablet Take 1 tablet by mouth daily.     ? losartan (COZAAR) 100 MG tablet Take 100 mg by mouth daily.    ? Lutein 10 MG TABS Take 1 tablet by mouth daily.     ? metFORMIN (GLUCOPHAGE) 500 MG tablet Take 1,000 mg by mouth 2 (two) times daily with a meal.    ? metoprolol (LOPRESSOR) 50 MG tablet Take 25 mg by mouth 2 (two)  times daily.    ? Multiple Vitamins-Minerals (ZINC PO) Take 1 tablet by mouth daily.    ? nitroGLYCERIN (NITROSTAT) 0.4 MG SL tablet Place 0.4 mg under the tongue every 5 (five) minutes x 3 doses as needed for chest pain.    ? pantoprazole (PROTONIX) 40 MG tablet Take 40 mg by mouth daily.     ? sitaGLIPtin (JANUVIA) 100 MG tablet Take 100 mg by mouth daily.    ? VITAMIN D PO Take 1 tablet by mouth daily.    ? ?No current facility-administered medications for this visit.  ?  ? ?Review of Systems  ?Please see the history of present illness.    ?(+) Left knee pain ? ?All other systems reviewed and are otherwise negative except as noted above. ? ?Physical Exam  ?  ?Wt Readings from Last 3 Encounters:  ?04/18/21 247 lb 6.4 oz (112.2 kg)  ?03/14/21 243 lb 8 oz (110.5 kg)  ?04/12/20 245 lb (111.1 kg)  ? ?VS: ?Vitals:  ? 04/18/21 1348  ?BP: 110/64  ?Pulse: 66  ?,Body mass index is 36.53 kg/m?. ? ?Constitutional:   ?   Appearance: Healthy appearance. Not in distress.  ?Neck:  ?   Vascular: JVD normal.  ?Pulmonary:  ?   Effort: Pulmonary effort is normal.  ?   Breath sounds: No wheezing. No rales.  ?Cardiovascular:  ?   Normal rate. Regular rhythm. Normal S1. Normal S2.   ?   Murmurs: There is no murmur.  ?Edema: ?   Peripheral edema absent.  ?Abdominal:  ?   Palpations: Abdomen is soft. There is no hepatomegaly.  ?Skin: ?   General: Skin is warm and dry.  ?Neurological:  ?   General: No focal deficit present.  ?   Mental Status: Alert and oriented to person, place and time.  ?   Cranial Nerves: Cranial nerves are intact.  ?EKG/LABS/Other Studies Reviewed  ?  ?ECG personally reviewed by me today -sinus rhythm with rate of 66- no acute changes. ? ? ?Lab Results  ?Component Value Date  ? WBC 5.3 03/14/2021  ? HGB 11.4 (L) 03/14/2021  ? HCT 36.2 (L) 03/14/2021  ? MCV 91.6 03/14/2021  ? PLT 154 03/14/2021  ? ?Lab Results  ?Component Value Date  ? CREATININE 1.49 (H) 03/14/2021  ? BUN 26 (H) 03/14/2021  ? NA 138 03/14/2021  ? K  5.0 03/14/2021  ? CL 111 03/14/2021  ? CO2 21 (L) 03/14/2021  ? ?Lab Results  ?Component Value Date  ? ALT 22 03/14/2021  ? AST 22 03/14/2021  ? ALKPHOS 52 03/14/2021  ? BILITOT 0.5 03/14/2021  ? ?Lab Results  ?Component Value Date  ? CHOL 108 03/01/2020  ? HDL 34 (L) 03/01/2020  ? [place]ony 62 03/01/2020  ? TRIG 47 03/01/2020  ? CHOLHDL 3.2  03/01/2020  ?  ?Lab Results  ?Component Value Date  ? HGBA1C (H) 08/05/2006  ?  6.6 ?(NOTE)   The ADA recommends the following therapeutic goals for glycemic   control related to Hgb A1C measurement:   Goal of Therapy:   < 7.0% Hgb A1C   Action Suggested:  > 8.0% Hgb A1C   Ref:  Diabetes Care, 22, Suppl. 1, 1999  ? ? ?Assessment & Plan  ?  ?1.  Preoperative clearance for total knee arthroplasty: ?-He may hold plavix and ASA 5-7 days prior to procedure, resume as soon as safe after procedure per Dr. Tamala Julian ? ?Mark West's perioperative risk of a major cardiac event is 0.4% according to the Revised Cardiac Risk Index (RCRI).  Therefore, he is at low risk for perioperative complications.   His functional capacity is good at 5.72 METs according to the Duke Activity Status Index (DASI). ?Recommendations: ?According to ACC/AHA guidelines, no further cardiovascular testing needed.  The patient may proceed to surgery at acceptable risk.   ?Antiplatelet and/or Anticoagulation Recommendations: ?Aspirin and Plavix can be held for 5-7 days prior to his surgery.  Please resume Aspirin post operatively when it is felt to be safe from a bleeding standpoint.  ?-Patient is cleared for total knee replacement and will have copy of note sent to surgeon today. ? ?2. Bilateral carotid artery stenosis ?-Carotid Doppler completed 02/2020 with stable disease and occluded right carotid artery. ?-Follow-up study recommended in 24 months ? ?3.  Hypertension: ?-Patient's blood pressure today was 110/64 ?-Continue losartan 100 mg daily and metoprolol 50 mg daily ?-Continue low-sodium heart healthy diet ? ?4.   Coronary artery disease: ?-s/p DES in 2005 ?-GDMT consist of ASA 81 mg and 75 mg Plavix daily, metoprolol 50 mg daily, and atorvastatin 40 mg daily ? ?5.  Hyperlipidemia: ?-Last LDL was 62, at goal of less

## 2021-04-18 ENCOUNTER — Encounter (HOSPITAL_BASED_OUTPATIENT_CLINIC_OR_DEPARTMENT_OTHER): Payer: Self-pay | Admitting: Nurse Practitioner

## 2021-04-18 ENCOUNTER — Ambulatory Visit (HOSPITAL_BASED_OUTPATIENT_CLINIC_OR_DEPARTMENT_OTHER): Payer: PPO | Admitting: Nurse Practitioner

## 2021-04-18 VITALS — BP 110/64 | HR 66 | Ht 69.0 in | Wt 247.4 lb

## 2021-04-18 DIAGNOSIS — I10 Essential (primary) hypertension: Secondary | ICD-10-CM

## 2021-04-18 DIAGNOSIS — I5030 Unspecified diastolic (congestive) heart failure: Secondary | ICD-10-CM

## 2021-04-18 DIAGNOSIS — I6521 Occlusion and stenosis of right carotid artery: Secondary | ICD-10-CM

## 2021-04-18 DIAGNOSIS — I252 Old myocardial infarction: Secondary | ICD-10-CM

## 2021-04-18 DIAGNOSIS — I2510 Atherosclerotic heart disease of native coronary artery without angina pectoris: Secondary | ICD-10-CM

## 2021-04-18 DIAGNOSIS — Z01818 Encounter for other preprocedural examination: Secondary | ICD-10-CM | POA: Diagnosis not present

## 2021-04-18 NOTE — Patient Instructions (Signed)
Medication Instructions:  ?Your Physician recommend you continue on your current medication as directed.   ? ?*If you need a refill on your cardiac medications before your next appointment, please call your pharmacy* ? ? ?Lab Work: ?None ordered today  ? ?Testing/Procedures: ?You are cleared for surgery and Ambrose Pancoast, NP will send your note of clearance to Dr. Alvan Dame.  ? ? ?Follow-Up: ?At [facility], you and your health needs are our priority.  As part of our continuing mission to provide you with exceptional heart care, we have created designated Provider Care Teams.  These Care Teams include your primary Cardiologist (physician) and Advanced Practice Providers (APPs -  Physician Assistants and Nurse Practitioners) who all work together to provide you with the care you need, when you need it. ? ?We recommend signing up for the patient portal called "MyChart".  Sign up information is provided on this After Visit Summary.  MyChart is used to connect with patients for Virtual Visits (Telemedicine).  Patients are able to view lab/test results, encounter notes, upcoming appointments, etc.  Non-urgent messages can be sent to your provider as well.   ?To learn more about what you can do with MyChart, go to [email or web address].   ? ?Your next appointment:   ?1 year(s) ? ?The format for your next appointment:   ?In Person ? ?Provider:   ?Sinclair Grooms, MD { ? ? ?Important Information About Sugar ? ? ? ? ? ? ?

## 2021-05-01 NOTE — Patient Instructions (Addendum)
DUE TO COVID-19 ONLY TWO VISITORS  (aged 79 and older)  IS ALLOWED TO COME WITH YOU AND STAY IN THE WAITING ROOM ONLY DURING PRE OP AND PROCEDURE.   ?**NO VISITORS ARE ALLOWED IN THE SHORT STAY AREA OR RECOVERY ROOM!!** ? ?IF YOU WILL BE ADMITTED INTO THE HOSPITAL YOU ARE ALLOWED ONLY FOUR SUPPORT PEOPLE DURING VISITATION HOURS ONLY (7 AM -8PM)   ?The support person(s) must pass our screening, gel in and out ?Visitors GUEST BADGE MUST BE WORN VISIBLY  ?One adult visitor may remain with you overnight and MUST be in the room by 8 P.M.  ? ?You are not required to quarantine ?Hand Hygiene often ?Do NOT share personal items ?Notify your provider if you are in close contact with someone who has COVID or you develop fever 100.4 or greater, new onset of sneezing, cough, sore throat, shortness of breath or body aches. ? ?     ? Your procedure is scheduled on:  05-15-21 ? ? Report to [facility] Main Entrance ? ?  Report to admitting at 7:15 AM ? ? Call this number if you have problems the morning of surgery [phone] ? ? Do not eat food :After Midnight. ? ? After Midnight you may have the following liquids until 7:00 AM DAY OF SURGERY ? ?Water ?Black Coffee (sugar ok, NO MILK/CREAM OR CREAMERS)  ?Tea (sugar ok, NO MILK/CREAM OR CREAMERS) regular and decaf                             ?Plain Jell-O (NO RED)                                           ?Fruit ices (not with fruit pulp, NO RED)                                     ?Popsicles (NO RED)                                                                  ?Juice: apple, WHITE grape, WHITE cranberry ?Sports drinks like Gatorade (NO RED) ?Clear broth(vegetable,chicken,beef) ? ? ?FOLLOW ANY ADDITIONAL PRE OP INSTRUCTIONS YOU RECEIVED FROM YOUR SURGEON'S OFFICE!!! ?  ?  ?Oral Hygiene is also important to reduce your risk of infection.                                    ?Remember - BRUSH YOUR TEETH THE MORNING OF SURGERY WITH YOUR REGULAR TOOTHPASTE ? ? Do NOT smoke  after Midnight ? ? Take these medicines the morning of surgery with A SIP OF WATER: Atorvastatin, Metoprolol, Pantoprazole.  Okay to use eyedrops ? ? Hold Plavix and Aspirin 5 to 7 days prior to surgery  ? ? ?WHAT DO I DO ABOUT MY DIABETES MEDICATION? ? ?Do not take oral diabetes medicines (pills) the morning of surgery. ? ?THE DAY BEFORE SURGERY:  Take Januvia and Metformin as  prescribed.     ? ? ?THE MORNING OF SURGERY:  Do not take Januvia or Metformin . ? ?Reviewed and Endorsed by [facility] Patient Education Committee, August 2015  ?                  ?           You may not have any metal on your body including  jewelry, and body piercing ? ?           Do not wear  lotions, powders, cologne, or deodorant ? ?            Men may shave face and neck. ? ? Do not bring valuables to the hospital. [place]. ? ? Contacts, dentures or bridgework may not be worn into surgery. ? ? Bring small overnight bag day of surgery. ?  ?Special Instructions: Bring a copy of your healthcare power of attorney and living will documents the day of surgery if you haven't scanned them before. ? ?Please read over the following fact sheets you were given: IF [place]  ? ?[place] - Preparing for Surgery ?Before surgery, you can play an important role.  Because skin is not sterile, your skin needs to be as free of germs as possible.  You can reduce the number of germs on your skin by washing with CHG (chlorahexidine gluconate) soap before surgery.  CHG is an antiseptic cleaner which kills germs and bonds with the skin to continue killing germs even after washing. ?Please DO NOT use if you have an allergy to CHG or antibacterial soaps.  If your skin becomes reddened/irritated stop using the CHG and inform your nurse when you arrive at Short Stay. ?Do not shave (including legs and underarms) for at least 48 hours prior to the first  CHG shower.  You may shave your face/neck. ? ?Please follow these instructions carefully: ? 1.  Shower with CHG Soap the night before surgery and the  morning of surgery. ? 2.  If you choose to wash your hair, wash your hair first as usual with your normal  shampoo. ? 3.  After you shampoo, rinse your hair and body thoroughly to remove the shampoo.                            ? 4.  Use CHG as you would any other liquid soap.  You can apply chg directly to the skin and wash.  Gently with a scrungie or clean washcloth. ? 5.  Apply the CHG Soap to your body ONLY FROM THE NECK DOWN.   Do   not use on face/ open      ?                     Wound or open sores. Avoid contact with eyes, ears mouth and   genitals (private parts).  ?                     Production manager,  Genitals (private parts) with your normal soap. ?            6.  Wash thoroughly, paying special attention to the area where your    surgery  will be performed. ? 7.  Thoroughly rinse your body with warm water from the neck  down. ? 8.  DO NOT shower/wash with your normal soap after using and rinsing off the CHG Soap. ?               9.  Pat yourself dry with a clean towel. ?           10.  Wear clean pajamas. ?           11.  Place clean sheets on your bed the night of your first shower and do not  sleep with pets. ?Day of Surgery : ?Do not apply any lotions/deodorants the morning of surgery.  Please wear clean clothes to the hospital/surgery center. ? ?FAILURE TO FOLLOW THESE INSTRUCTIONS MAY RESULT IN THE CANCELLATION OF YOUR SURGERY ? ?PATIENT SIGNATURE_________________________________ ? ?NURSE SIGNATURE__________________________________ ? ?________________________________________________________________________  ?  ? ?Incentive Spirometer ? ?An incentive spirometer is a tool that can help keep your lungs clear and active. This tool measures how well you are filling your lungs with each breath. Taking long deep breaths may help reverse or decrease the chance of  developing breathing (pulmonary) problems (especially infection) following: ?A long period of time when you are unable to move or be active. ?BEFORE THE PROCEDURE  ?If the spirometer includes an indicator to show your best effort, your nurse or respiratory therapist will set it to a desired goal. ?If possible, sit up straight or lean slightly forward. Try not to slouch. ?Hold the incentive spirometer in an upright position. ?INSTRUCTIONS FOR USE  ?Sit on the edge of your bed if possible, or sit up as far as you can in bed or on a chair. ?Hold the incentive spirometer in an upright position. ?Breathe out normally. ?Place the mouthpiece in your mouth and seal your lips tightly around it. ?Breathe in slowly and as deeply as possible, raising the piston or the ball toward the top of the column. ?Hold your breath for 3-5 seconds or for as long as possible. Allow the piston or ball to fall to the bottom of the column. ?Remove the mouthpiece from your mouth and breathe out normally. ?Rest for a few seconds and repeat Steps 1 through 7 at least 10 times every 1-2 hours when you are awake. Take your time and take a few normal breaths between deep breaths. ?The spirometer may include an indicator to show your best effort. Use the indicator as a goal to work toward during each repetition. ?After each set of 10 deep breaths, practice coughing to be sure your lungs are clear. If you have an incision (the cut made at the time of surgery), support your incision when coughing by placing a pillow or rolled up towels firmly against it. ?Once you are able to get out of bed, walk around indoors and cough well. You may stop using the incentive spirometer when instructed by your caregiver.  ?RISKS AND COMPLICATIONS ?Take your time so you do not get dizzy or light-headed. ?If you are in pain, you may need to take or ask for pain medication before doing incentive spirometry. It is harder to take a deep breath if you are having pain. ?AFTER  USE ?Rest and breathe slowly and easily. ?It can be helpful to keep track of a log of your progress. Your caregiver can provide you with a simple table to help with this. ?If you are using the spiromete

## 2021-05-01 NOTE — Progress Notes (Addendum)
COVID Vaccine Completed:  No ?Date COVID Vaccine completed: ?Has received booster: ?COVID vaccine manufacturer: [employer]  ? ?Date of COVID positive in last 90 days:  No ? ?PCP - Wenda Low, MD (note on chart) ?Cardiologist - Daneen Schick, MD ? ?Cardiac clearance in Epic dated 04-18-21  ?by Rebekah Chesterfield. NP ? ?Medical clearance on chart dated 04-17-21 by Dr. Lysle Rubens ? ?Chest x-ray - N/A ?EKG - 04-18-21 EKG  ?Stress Test - greater than 2 years Epic ?ECHO - greater than 2 years Epic ?Cardiac Cath - greater than 2 years  ?Pacemaker/ICD device last checked: ?Spinal Cord Stimulator: ? ?Bowel Prep - N/A ? ?Sleep Study - N/A ?CPAP -  ? ?Fasting Blood Sugar -  ?Checks Blood Sugar - does not check  ? ?Blood Thinner Instructions:  Plavix okay to hold 5-7 days per clearance.  Patent aware ? ?Aspirin Instructions:  ASA 81 mg okay to hold 5-7 days per clearance.  Patient aware ?Last Dose: ? ?Activity level:   Can go up a flight of stairs and perform activities of daily living without stopping and without symptoms of chest pain.  Paitent states that he does have shortness of breath with climbing stairs. ?   ?Anesthesia review:  CAD with CABG, hx if MI, HTN, DM, bilateral carotid artery stenosis ? ?BP elevated at PAT initial BP 184/78 and on recheck 182/84 ? ?Patient denies shortness of breath, fever, cough and chest pain at PAT appointment ? ?Patient verbalized understanding of instructions that were given to them at the PAT appointment. Patient was also instructed that they will need to review over the PAT instructions again at home before surgery.  ?

## 2021-05-02 ENCOUNTER — Encounter (HOSPITAL_COMMUNITY): Payer: Self-pay

## 2021-05-02 ENCOUNTER — Encounter (HOSPITAL_COMMUNITY)
Admission: RE | Admit: 2021-05-02 | Discharge: 2021-05-02 | Disposition: A | Discharge status: Home / Self Care | Payer: PPO | Source: Ambulatory Visit | Attending: Orthopedic Surgery | Admitting: Orthopedic Surgery

## 2021-05-02 ENCOUNTER — Other Ambulatory Visit: Payer: Self-pay

## 2021-05-02 VITALS — BP 162/84 | HR 59 | Temp 98.4°F | Resp 20 | Ht 69.0 in | Wt 244.0 lb

## 2021-05-02 DIAGNOSIS — I2510 Atherosclerotic heart disease of native coronary artery without angina pectoris: Secondary | ICD-10-CM

## 2021-05-02 DIAGNOSIS — Z01818 Encounter for other preprocedural examination: Secondary | ICD-10-CM

## 2021-05-02 DIAGNOSIS — M1712 Unilateral primary osteoarthritis, left knee: Secondary | ICD-10-CM

## 2021-05-02 DIAGNOSIS — E119 Type 2 diabetes mellitus without complications: Secondary | ICD-10-CM | POA: Diagnosis not present

## 2021-05-02 DIAGNOSIS — D649 Anemia, unspecified: Secondary | ICD-10-CM | POA: Diagnosis not present

## 2021-05-02 DIAGNOSIS — Z01812 Encounter for preprocedural laboratory examination: Secondary | ICD-10-CM | POA: Diagnosis not present

## 2021-05-02 HISTORY — DX: Gastro-esophageal reflux disease without esophagitis: K21.9

## 2021-05-02 HISTORY — DX: Personal history of urinary calculi: Z87.442

## 2021-05-02 LAB — BASIC METABOLIC PANEL
Anion gap: 6 (ref 5–15)
BUN: 23 mg/dL (ref 8–23)
CO2: 22 mmol/L (ref 22–32)
Calcium: 9.1 mg/dL (ref 8.9–10.3)
Chloride: 112 mmol/L — ABNORMAL HIGH (ref 98–111)
Creatinine, Ser: 1.42 mg/dL — ABNORMAL HIGH (ref 0.61–1.24)
GFR, Estimated: 51 mL/min — ABNORMAL LOW (ref >60)
Glucose, Bld: 128 mg/dL — ABNORMAL HIGH (ref 70–99)
Potassium: 4.7 mmol/L (ref 3.5–5.1)
Sodium: 140 mmol/L (ref 135–145)

## 2021-05-02 LAB — GLUCOSE, CAPILLARY: Glucose-Capillary: 141 mg/dL — ABNORMAL HIGH (ref 70–99)

## 2021-05-02 LAB — CBC
HCT: 37.8 % — ABNORMAL LOW (ref 39.0–52.0)
Hemoglobin: 11.7 g/dL — ABNORMAL LOW (ref 13.0–17.0)
MCH: 29.3 pg (ref 26.0–34.0)
MCHC: 31 g/dL (ref 30.0–36.0)
MCV: 94.7 fL (ref 80.0–100.0)
Platelets: 165 10*3/uL (ref 150–400)
RBC: 3.99 MIL/uL — ABNORMAL LOW (ref 4.22–5.81)
RDW: 14.2 % (ref 11.5–15.5)
WBC: 5.5 10*3/uL (ref 4.0–10.5)
nRBC: 0 % (ref 0.0–0.2)

## 2021-05-02 LAB — TYPE AND SCREEN
ABO/RH(D): A POS
Antibody Screen: NEGATIVE

## 2021-05-02 LAB — SURGICAL PCR SCREEN
MRSA, PCR: NEGATIVE
Staphylococcus aureus: NEGATIVE

## 2021-05-04 DIAGNOSIS — M1712 Unilateral primary osteoarthritis, left knee: Secondary | ICD-10-CM | POA: Diagnosis not present

## 2021-05-04 NOTE — Anesthesia Preprocedure Evaluation (Addendum)
Anesthesia Evaluation  ?Patient identified by MRN, date of birth, ID band ?Patient awake ? ? ? ?Reviewed: ?Allergy & Precautions, NPO status , Patient's Chart, lab work & pertinent test results ? ?Airway ?Mallampati: II ? ?TM Distance: >3 FB ?Neck ROM: Full ? ? ? Dental ? ?(+) Upper Dentures, Dental Advisory Given ?  ?Pulmonary ?asthma , former smoker,  ?  ?breath sounds clear to auscultation ? ? ? ? ? ? Cardiovascular ?hypertension, + CAD, + Past MI, + Cardiac Stents and + Peripheral Vascular Disease  ? ?Rhythm:Regular Rate:Normal ? ? ?  ?Neuro/Psych ?negative neurological ROS ? negative psych ROS  ? GI/Hepatic ?Neg liver ROS, GERD  ,  ?Endo/Other  ?diabetes ? Renal/GU ?Renal disease  ? ?  ?Musculoskeletal ? ?(+) Arthritis ,  ? Abdominal ?Normal abdominal exam  (+)   ?Peds ? Hematology ?  ?Anesthesia Other Findings ? ? Reproductive/Obstetrics ? ?  ? ? ? ? ? ? ? ? ? ? ? ? ? ?  ?  ? ? ? ? ? ? ?Anesthesia Physical ?Anesthesia Plan ? ?ASA: 3 ? ?Anesthesia Plan: Spinal  ? ?Post-op Pain Management: Regional block*  ? ?Induction: Intravenous ? ?PONV Risk Score and Plan: 2 and Ondansetron, Dexamethasone and Treatment may vary due to age or medical condition ? ?Airway Management Planned: Natural Airway and Simple Face Mask ? ?Additional Equipment: None ? ?Intra-op Plan:  ? ?Post-operative Plan:  ? ?Informed Consent: I have reviewed the patients History and Physical, chart, labs and discussed the procedure including the risks, benefits and alternatives for the proposed anesthesia with the patient or authorized representative who has indicated his/her understanding and acceptance.  ? ? ? ? ? ?Plan Discussed with: CRNA ? ?Anesthesia Plan Comments: (See PAT note 05/02/2021 ? ?Discontinued plavix 7 days ago per patient. )  ? ? ? ? ? ?Anesthesia Quick Evaluation ? ?

## 2021-05-04 NOTE — Progress Notes (Signed)
Anesthesia Chart Review ? ? Case: [id number] Date/Time: 05/15/21 0950  ? Procedure: TOTAL KNEE ARTHROPLASTY (Left: Knee)  ? Anesthesia type: Spinal  ? Pre-op diagnosis: Left knee osteoarthritis  ? Location: [facility] ROOM 09 / [facility]  ? Surgeons: Paralee Cancel, MD  ? ?  ? ? ?DISCUSSION:80 y.o. former smoker smoker with h/o HTN, GERD, CAD (DES 2005), left kne OA scheduled for above procedure 05/15/2021 with Dr. Paralee Cancel.  ? ?Pt last seen by cardiology 04/18/2021. Per OV note, "Mark West's perioperative risk of a major cardiac event is 0.4% according to the Revised Cardiac Risk Index (RCRI).  Therefore, he is at low risk for perioperative complications.   His functional capacity is good at 5.72 METs according to the Duke Activity Status Index (DASI). ?Recommendations: ?According to ACC/AHA guidelines, no further cardiovascular testing needed.  The patient may proceed to surgery at acceptable risk.   ?Antiplatelet and/or Anticoagulation Recommendations: ?Aspirin and Plavix can be held for 5-7 days prior to his surgery.  Please resume Aspirin post operatively when it is felt to be safe from a bleeding standpoint.  ?-Patient is cleared for total knee replacement and will have copy of note sent to surgeon today." ? ?Anticipate pt can proceed with planned procedure barring acute status change.   ?VS: BP (!) 162/84   Pulse (!) 59   Temp 36.9 ?C (Oral)   Resp 20   Ht '5\' 9"'$  (1.753 m)   Wt 110.7 kg   SpO2 98%   BMI 36.03 kg/m?  ? ?PROVIDERS: ?Wenda Low, MD is PCP  ? ?Primary Cardiologist:  Sinclair Grooms, MD ?LABS: Labs reviewed: Acceptable for surgery. ?(all labs ordered are listed, but only abnormal results are displayed) ? ?Labs Reviewed  ?BASIC METABOLIC PANEL - Abnormal; Notable for the following components:  ?    Result Value  ? Chloride 112 (*)   ? Glucose, Bld 128 (*)   ? Creatinine, Ser 1.42 (*)   ? GFR, Estimated 51 (*)   ? All other components within normal limits  ?CBC - Abnormal; Notable for the  following components:  ? RBC 3.99 (*)   ? Hemoglobin 11.7 (*)   ? HCT 37.8 (*)   ? All other components within normal limits  ?GLUCOSE, CAPILLARY - Abnormal; Notable for the following components:  ? Glucose-Capillary 141 (*)   ? All other components within normal limits  ?SURGICAL PCR SCREEN  ?TYPE AND SCREEN  ? ? ? ?IMAGES: ? ? ?EKG: ? ? ?CV: ? ?Past Medical History:  ?Diagnosis Date  ? Anemia, iron deficiency 01/26/2011  ? Arthritis   ? Asthma   ? Cancer [facility])   ? nhl, colon ca  ? Carotid artery occlusion   ? right occlusion of carotid with moderate left plaque  ? Coronary artery disease 08/08/2003  ? MI with stent  ? Diabetes mellitus   ? GERD (gastroesophageal reflux disease)   ? History of kidney stones   ? Hyperlipidemia   ? Hypertension   ? Kidney stones 05/08/2010  ? lithotripsy  ? Myocardial infarction [facility])   ? ? ?Past Surgical History:  ?Procedure Laterality Date  ? CARPAL TUNNEL RELEASE Left 05/08/2015  ? Procedure: CARPAL TUNNEL RELEASE;  Surgeon: Charlotte Crumb, MD;  Location: [place];  Service: Orthopedics;  Laterality: Left;  ? COLON SURGERY    ? COLONOSCOPY    ? CORONARY STENT PLACEMENT    ? EYE SURGERY    ? HERNIA REPAIR    ?  OPEN REDUCTION INTERNAL FIXATION (ORIF) DISTAL RADIAL FRACTURE Left 05/08/2015  ? Procedure: OPEN REDUCTION INTERNAL FIXATION (ORIF) DISTAL RADIAL FRACTURE, CARPAL TUNNEL RELEASE;  Surgeon: Charlotte Crumb, MD;  Location: [place];  Service: Orthopedics;  Laterality: Left;  ? REPAIR OF COMPLEX TRACTION RETINAL DETACHMENT    ? ? ?MEDICATIONS: ? aspirin 81 MG tablet  ? atorvastatin (LIPITOR) 40 MG tablet  ? clopidogrel (PLAVIX) 75 MG tablet  ? Coenzyme Q10 (CO Q 10 PO)  ? Cyanocobalamin (B-12 PO)  ? dorzolamide-timolol (COSOPT) 22.3-6.8 MG/ML ophthalmic solution  ? latanoprost (XALATAN) 0.005 % ophthalmic solution  ? losartan (COZAAR) 100 MG tablet  ? Lutein 20 MG TABS  ? metFORMIN (GLUCOPHAGE) 500 MG tablet  ? metoprolol (LOPRESSOR) 50 MG tablet  ? Multiple Vitamins-Minerals  (ZINC PO)  ? nitroGLYCERIN (NITROSTAT) 0.4 MG SL tablet  ? pantoprazole (PROTONIX) 40 MG tablet  ? prednisoLONE acetate (PRED FORTE) 1 % ophthalmic suspension  ? sitaGLIPtin (JANUVIA) 100 MG tablet  ? VITAMIN D PO  ? ?No current facility-administered medications for this encounter.  ? ? ? ?Mark Felix Ward, PA-C ?[facility] ?[phone] ? ? ? ? ? ?

## 2021-05-14 NOTE — H&P (Signed)
TOTAL KNEE ADMISSION H&P ? ?Patient is being admitted for left total knee arthroplasty. ? ?Subjective: ? ?Chief Complaint:left knee pain. ? ?HPI: Mark West., 81 y.o. male, has a history of pain and functional disability in the left knee due to arthritis and has failed non-surgical conservative treatments for greater than 12 weeks to includeNSAID's and/or analgesics, corticosteriod injections, and activity modification.  Onset of symptoms was gradual, starting 2 years ago with gradually worsening course since that time. The patient noted prior procedures on the knee to include  arthroscopy and menisectomy on the left knee(s).  Patient currently rates pain in the left knee(s) at 8 out of 10 with activity. Patient has worsening of pain with activity and weight bearing, pain that interferes with activities of daily living, and pain with passive range of motion.  Patient has evidence of joint space narrowing by imaging studies.  There is no active infection. ? ?Patient Active Problem List  ? Diagnosis Date Noted  ? Stenosis of right subclavian artery ([place]) 12/21/2016  ? Vitamin D deficiency 01/10/2016  ? Hx of iron deficiency anemia 12/31/2014  ? Follicular lymphoma grade I of intra-abdominal lymph nodes ([place]) 12/31/2014  ? Blind right eye 01/01/2014  ? Essential hypertension, benign 07/08/2013  ? Mixed hyperlipidemia 07/08/2013  ? Carotid stenosis 07/08/2013  ? Abdominal pain, other specified site 11/05/2012  ? Iron deficiency anemia secondary to blood loss (chronic) 05/12/2012  ? History of colon cancer 04/01/2011  ? Lymphoma, follicular ([place]) 29/92/4268  ? Coronary artery disease with hx of myocardial infarct w/o hx of CABG 04/01/2011  ? Nephrolithiasis 04/01/2011  ? Diabetes mellitus 04/01/2011  ? Obesity 04/01/2011  ? Anemia, iron deficiency 01/26/2011  ? ?Past Medical History:  ?Diagnosis Date  ? Anemia, iron deficiency 01/26/2011  ? Arthritis   ? Asthma   ? Cancer [facility])   ? nhl, colon ca  ? Carotid  artery occlusion   ? right occlusion of carotid with moderate left plaque  ? Coronary artery disease 08/08/2003  ? MI with stent  ? Diabetes mellitus   ? GERD (gastroesophageal reflux disease)   ? History of kidney stones   ? Hyperlipidemia   ? Hypertension   ? Kidney stones 05/08/2010  ? lithotripsy  ? Myocardial infarction [facility])   ?  ?Past Surgical History:  ?Procedure Laterality Date  ? CARPAL TUNNEL RELEASE Left 05/08/2015  ? Procedure: CARPAL TUNNEL RELEASE;  Surgeon: Charlotte Crumb, MD;  Location: [place];  Service: Orthopedics;  Laterality: Left;  ? COLON SURGERY    ? COLONOSCOPY    ? CORONARY STENT PLACEMENT    ? EYE SURGERY    ? HERNIA REPAIR    ? OPEN REDUCTION INTERNAL FIXATION (ORIF) DISTAL RADIAL FRACTURE Left 05/08/2015  ? Procedure: OPEN REDUCTION INTERNAL FIXATION (ORIF) DISTAL RADIAL FRACTURE, CARPAL TUNNEL RELEASE;  Surgeon: Charlotte Crumb, MD;  Location: [place];  Service: Orthopedics;  Laterality: Left;  ? REPAIR OF COMPLEX TRACTION RETINAL DETACHMENT    ?  ?No current facility-administered medications for this encounter.  ? ?Current Outpatient Medications  ?Medication Sig Dispense Refill Last Dose  ? aspirin 81 MG tablet Take 81 mg by mouth daily.     ? atorvastatin (LIPITOR) 40 MG tablet Take 40 mg by mouth daily.     ? clopidogrel (PLAVIX) 75 MG tablet Take 75 mg by mouth daily.     ? Coenzyme Q10 (CO Q 10 PO) Take 1 capsule by mouth daily.      ?  Cyanocobalamin (B-12 PO) Place 1,000 mcg under the tongue daily.     ? dorzolamide-timolol (COSOPT) 22.3-6.8 MG/ML ophthalmic solution Place 1 drop into both eyes 2 (two) times daily.     ? latanoprost (XALATAN) 0.005 % ophthalmic solution Place 1 drop into both eyes at bedtime.     ? losartan (COZAAR) 100 MG tablet Take 100 mg by mouth daily.     ? Lutein 20 MG TABS Take 20 mg by mouth daily.     ? metFORMIN (GLUCOPHAGE) 500 MG tablet Take 1,000 mg by mouth 2 (two) times daily with a meal.     ? metoprolol (LOPRESSOR) 50 MG tablet Take 25 mg by  mouth 2 (two) times daily.     ? Multiple Vitamins-Minerals (ZINC PO) Take 1 tablet by mouth daily.     ? nitroGLYCERIN (NITROSTAT) 0.4 MG SL tablet Place 0.4 mg under the tongue every 5 (five) minutes x 3 doses as needed for chest pain.     ? pantoprazole (PROTONIX) 40 MG tablet Take 40 mg by mouth daily.      ? prednisoLONE acetate (PRED FORTE) 1 % ophthalmic suspension Place 1 drop into the right eye daily.     ? sitaGLIPtin (JANUVIA) 100 MG tablet Take 100 mg by mouth daily.     ? VITAMIN D PO Take 1 tablet by mouth daily.     ? ?Allergies  ?Allergen Reactions  ? Iodine Other (See Comments)  ?   EYE SWELLING WITH CONTRAST MEDIA  ? Iodinated Contrast Media Other (See Comments)  ? Lisinopril Other (See Comments)  ?  ?Social History  ? ?Tobacco Use  ? Smoking status: Former  ?  Packs/day: 1.00  ?  Years: 20.00  ?  Pack years: 20.00  ?  Types: Cigarettes  ?  Quit date: 01/08/1980  ?  Years since quitting: 41.3  ? Smokeless tobacco: Never  ?Substance Use Topics  ? Alcohol use: No  ?  Alcohol/week: 0.0 standard drinks  ?  ?Family History  ?Problem Relation Age of Onset  ? Cancer Mother   ?     ovarian  ? Cancer Father   ?     lymphoma  ? Cancer Brother   ?     colon  ? Cancer Maternal Aunt   ?     colon  ? Healthy Sister   ?  ? ?Review of Systems  ?Constitutional:  Negative for chills and fever.  ?Respiratory:  Negative for cough and shortness of breath.   ?Cardiovascular:  Negative for chest pain.  ?Gastrointestinal:  Negative for nausea and vomiting.  ?Musculoskeletal:  Positive for arthralgias.  ? ? ?Objective: ? ?Physical Exam ?Well nourished and well developed. ?General: Alert and oriented x3, cooperative and pleasant, no acute distress. ?Head: normocephalic, atraumatic, neck supple. ?Eyes: EOMI. ? ?Musculoskeletal: ?Left knee exam: ?No palpable effusion, warmth erythema ?Genu varum associated with a slight 5 degree flexion contracture with flexion over 110 degrees with tightness over the anterior medial aspect of  the joint ?Stable medial and lateral collateral ligaments ?Tenderness mainly medial and anterior ?No lower extremity edema or erythema ?Right knee exam reveals no signs of an effusion with full knee extension and flexion ? ?Calves soft and nontender. Motor function intact in LE. Strength 5/5 LE bilaterally. ?Neuro: Distal pulses 2+. Sensation to light touch intact in LE. ? ?Vital signs in last 24 hours: ?  ? ?Labs: ? ? ?Estimated body mass index is 36.03 kg/m? as calculated from  the following: ?  Height as of 05/02/21: '5\' 9"'$  (1.753 m). ?  Weight as of 05/02/21: 110.7 kg. ? ? ?Imaging Review ?Plain radiographs demonstrate severe degenerative joint disease of the left knee(s). The overall alignment isneutral. The bone quality appears to be adequate for age and reported activity level. ? ? ? ? ? ?Assessment/Plan: ? ?End stage arthritis, left knee  ? ?The patient history, physical examination, clinical judgment of the provider and imaging studies are consistent with end stage degenerative joint disease of the left knee(s) and total knee arthroplasty is deemed medically necessary. The treatment options including medical management, injection therapy arthroscopy and arthroplasty were discussed at length. The risks and benefits of total knee arthroplasty were presented and reviewed. The risks due to aseptic loosening, infection, stiffness, patella tracking problems, thromboembolic complications and other imponderables were discussed. The patient acknowledged the explanation, agreed to proceed with the plan and consent was signed. Patient is being admitted for inpatient treatment for surgery, pain control, PT, OT, prophylactic antibiotics, VTE prophylaxis, progressive ambulation and ADL's and discharge planning. The patient is planning to be discharged  home. ? ?Therapy Plans: outpatient therapy at [facility] ?Disposition: Home with wife ?Planned DVT Prophylaxis: Plavix & ASA ?DME needed: none ?PCP: Dr. Lysle Rubens, clearance  received ?Cardiologist: Dr. Daneen Schick, clearance received ?TXA: IV ?Allergies: Iodine - eye swelling ?Anesthesia Concerns: none ?BMI: 36.7 ?Last HgbA1c: 7.0% ? ? ?Other: ?- Hx of MI, CKD, CAD, DM ?- staying overnight

## 2021-05-15 ENCOUNTER — Observation Stay (HOSPITAL_COMMUNITY)
Admission: RE | Admit: 2021-05-15 | Discharge: 2021-05-16 | Disposition: A | Discharge status: Home / Self Care | Payer: PPO | Source: Ambulatory Visit | Attending: Orthopedic Surgery | Admitting: Orthopedic Surgery

## 2021-05-15 ENCOUNTER — Ambulatory Visit (HOSPITAL_COMMUNITY): Payer: PPO | Admitting: Physician Assistant

## 2021-05-15 ENCOUNTER — Ambulatory Visit (HOSPITAL_BASED_OUTPATIENT_CLINIC_OR_DEPARTMENT_OTHER): Payer: PPO | Admitting: Certified Registered Nurse Anesthetist

## 2021-05-15 ENCOUNTER — Encounter (HOSPITAL_COMMUNITY): Payer: Self-pay | Admitting: Orthopedic Surgery

## 2021-05-15 ENCOUNTER — Other Ambulatory Visit: Payer: Self-pay

## 2021-05-15 ENCOUNTER — Encounter (HOSPITAL_COMMUNITY)
Admission: RE | Disposition: A | Discharge status: Home / Self Care | Payer: Self-pay | Source: Ambulatory Visit | Attending: Orthopedic Surgery

## 2021-05-15 DIAGNOSIS — Z96652 Presence of left artificial knee joint: Secondary | ICD-10-CM

## 2021-05-15 DIAGNOSIS — G8918 Other acute postprocedural pain: Secondary | ICD-10-CM | POA: Diagnosis not present

## 2021-05-15 DIAGNOSIS — M1712 Unilateral primary osteoarthritis, left knee: Principal | ICD-10-CM | POA: Insufficient documentation

## 2021-05-15 DIAGNOSIS — Z85038 Personal history of other malignant neoplasm of large intestine: Secondary | ICD-10-CM | POA: Insufficient documentation

## 2021-05-15 DIAGNOSIS — Z7982 Long term (current) use of aspirin: Secondary | ICD-10-CM | POA: Insufficient documentation

## 2021-05-15 DIAGNOSIS — Z79899 Other long term (current) drug therapy: Secondary | ICD-10-CM | POA: Insufficient documentation

## 2021-05-15 DIAGNOSIS — Z951 Presence of aortocoronary bypass graft: Secondary | ICD-10-CM | POA: Diagnosis not present

## 2021-05-15 DIAGNOSIS — I252 Old myocardial infarction: Secondary | ICD-10-CM | POA: Diagnosis not present

## 2021-05-15 DIAGNOSIS — Z7984 Long term (current) use of oral hypoglycemic drugs: Secondary | ICD-10-CM | POA: Diagnosis not present

## 2021-05-15 DIAGNOSIS — I2510 Atherosclerotic heart disease of native coronary artery without angina pectoris: Secondary | ICD-10-CM

## 2021-05-15 DIAGNOSIS — I10 Essential (primary) hypertension: Secondary | ICD-10-CM | POA: Diagnosis not present

## 2021-05-15 DIAGNOSIS — J45909 Unspecified asthma, uncomplicated: Secondary | ICD-10-CM | POA: Insufficient documentation

## 2021-05-15 DIAGNOSIS — Z7902 Long term (current) use of antithrombotics/antiplatelets: Secondary | ICD-10-CM | POA: Diagnosis not present

## 2021-05-15 DIAGNOSIS — Z87891 Personal history of nicotine dependence: Secondary | ICD-10-CM | POA: Insufficient documentation

## 2021-05-15 DIAGNOSIS — E119 Type 2 diabetes mellitus without complications: Secondary | ICD-10-CM | POA: Insufficient documentation

## 2021-05-15 DIAGNOSIS — Z955 Presence of coronary angioplasty implant and graft: Secondary | ICD-10-CM | POA: Diagnosis not present

## 2021-05-15 HISTORY — PX: TOTAL KNEE ARTHROPLASTY: SHX125

## 2021-05-15 LAB — GLUCOSE, CAPILLARY
Glucose-Capillary: 154 mg/dL — ABNORMAL HIGH (ref 70–99)
Glucose-Capillary: 222 mg/dL — ABNORMAL HIGH (ref 70–99)
Glucose-Capillary: 157 mg/dL — ABNORMAL HIGH (ref 70–99)

## 2021-05-15 LAB — HEMOGLOBIN A1C
Hgb A1c MFr Bld: 6.6 % — ABNORMAL HIGH (ref 4.8–5.6)
Mean Plasma Glucose: 142.72 mg/dL

## 2021-05-15 SURGERY — ARTHROPLASTY, KNEE, TOTAL
Anesthesia: Spinal | Site: Knee | Laterality: Left

## 2021-05-15 MED ORDER — DIPHENHYDRAMINE HCL 12.5 MG/5ML PO ELIX: 12.5000 mg | ORAL_SOLUTION | ORAL | Status: DC | PRN

## 2021-05-15 MED ORDER — LATANOPROST 0.005 % OP SOLN
1.0000 [drp] | Freq: Every day | OPHTHALMIC | Status: DC
  Filled 2021-05-15: qty 2.5

## 2021-05-15 MED ORDER — TRANEXAMIC ACID-NACL 1000-0.7 MG/100ML-% IV SOLN
1000.0000 mg | Freq: Once | INTRAVENOUS | Status: AC
  Administered 2021-05-15: 1000 mg via INTRAVENOUS
  Filled 2021-05-15: qty 100

## 2021-05-15 MED ORDER — SODIUM CHLORIDE 0.9 % IV SOLN: INTRAVENOUS | Status: DC

## 2021-05-15 MED ORDER — LUTEIN 20 MG PO TABS: 20.0000 mg | ORAL_TABLET | Freq: Every day | ORAL | Status: DC

## 2021-05-15 MED ORDER — METOCLOPRAMIDE HCL 5 MG/ML IJ SOLN: 5.0000 mg | Freq: Three times a day (TID) | INTRAMUSCULAR | Status: DC | PRN

## 2021-05-15 MED ORDER — ONDANSETRON HCL 4 MG PO TABS: 4.0000 mg | ORAL_TABLET | Freq: Four times a day (QID) | ORAL | Status: DC | PRN

## 2021-05-15 MED ORDER — 0.9 % SODIUM CHLORIDE (POUR BTL) OPTIME
TOPICAL | Status: DC | PRN
  Administered 2021-05-15: 1000 mL

## 2021-05-15 MED ORDER — OXYCODONE HCL 5 MG PO TABS: 10.0000 mg | ORAL_TABLET | ORAL | Status: DC | PRN

## 2021-05-15 MED ORDER — FENTANYL CITRATE PF 50 MCG/ML IJ SOSY
50.0000 ug | PREFILLED_SYRINGE | INTRAMUSCULAR | Status: DC
  Administered 2021-05-15: 50 ug via INTRAVENOUS
  Filled 2021-05-15: qty 2

## 2021-05-15 MED ORDER — ONDANSETRON HCL 4 MG/2ML IJ SOLN
INTRAMUSCULAR | Status: AC
  Filled 2021-05-15: qty 2

## 2021-05-15 MED ORDER — ONDANSETRON HCL 4 MG/2ML IJ SOLN: 4.0000 mg | Freq: Four times a day (QID) | INTRAMUSCULAR | Status: DC | PRN

## 2021-05-15 MED ORDER — ORAL CARE MOUTH RINSE: 15.0000 mL | Freq: Once | OROMUCOSAL | Status: AC

## 2021-05-15 MED ORDER — SODIUM CHLORIDE (PF) 0.9 % IJ SOLN
INTRAMUSCULAR | Status: AC
  Filled 2021-05-15: qty 10

## 2021-05-15 MED ORDER — METOPROLOL TARTRATE 25 MG PO TABS
25.0000 mg | ORAL_TABLET | Freq: Two times a day (BID) | ORAL | Status: DC
  Administered 2021-05-15 – 2021-05-16 (×2): 25 mg via ORAL
  Filled 2021-05-15 (×2): qty 1

## 2021-05-15 MED ORDER — CHLORHEXIDINE GLUCONATE 0.12 % MT SOLN
15.0000 mL | Freq: Once | OROMUCOSAL | Status: AC
  Administered 2021-05-15: 15 mL via OROMUCOSAL

## 2021-05-15 MED ORDER — PREDNISOLONE ACETATE 1 % OP SUSP
1.0000 [drp] | Freq: Every day | OPHTHALMIC | Status: DC
  Filled 2021-05-15: qty 5

## 2021-05-15 MED ORDER — DORZOLAMIDE HCL-TIMOLOL MAL 2-0.5 % OP SOLN
1.0000 [drp] | Freq: Two times a day (BID) | OPHTHALMIC | Status: DC
  Filled 2021-05-15: qty 10

## 2021-05-15 MED ORDER — BISACODYL 10 MG RE SUPP
10.0000 mg | Freq: Every day | RECTAL | Status: DC | PRN
Start: 2021-05-15 — End: 2021-05-16

## 2021-05-15 MED ORDER — CLOPIDOGREL BISULFATE 75 MG PO TABS
75.0000 mg | ORAL_TABLET | Freq: Every day | ORAL | Status: DC
  Administered 2021-05-16: 75 mg via ORAL
  Filled 2021-05-15: qty 1

## 2021-05-15 MED ORDER — METOCLOPRAMIDE HCL 5 MG PO TABS: 5.0000 mg | ORAL_TABLET | Freq: Three times a day (TID) | ORAL | Status: DC | PRN

## 2021-05-15 MED ORDER — BUPIVACAINE IN DEXTROSE 0.75-8.25 % IT SOLN
INTRATHECAL | Status: DC | PRN
  Administered 2021-05-15: 1.6 mL via INTRATHECAL

## 2021-05-15 MED ORDER — INSULIN ASPART 100 UNIT/ML IJ SOLN
0.0000 [IU] | Freq: Three times a day (TID) | INTRAMUSCULAR | Status: DC
  Administered 2021-05-15: 5 [IU] via SUBCUTANEOUS
  Administered 2021-05-16 (×2): 3 [IU] via SUBCUTANEOUS

## 2021-05-15 MED ORDER — POLYETHYLENE GLYCOL 3350 17 G PO PACK: 17.0000 g | PACK | Freq: Every day | ORAL | Status: DC | PRN

## 2021-05-15 MED ORDER — KETOROLAC TROMETHAMINE 30 MG/ML IJ SOLN
INTRAMUSCULAR | Status: DC | PRN
  Administered 2021-05-15: 30 mg

## 2021-05-15 MED ORDER — STERILE WATER FOR IRRIGATION IR SOLN
Status: DC | PRN
  Administered 2021-05-15: 2000 mL

## 2021-05-15 MED ORDER — ATORVASTATIN CALCIUM 40 MG PO TABS
40.0000 mg | ORAL_TABLET | Freq: Every day | ORAL | Status: DC
  Administered 2021-05-15: 40 mg via ORAL
  Filled 2021-05-15: qty 1

## 2021-05-15 MED ORDER — ASPIRIN EC 81 MG PO TBEC
81.0000 mg | DELAYED_RELEASE_TABLET | Freq: Every day | ORAL | Status: DC
  Administered 2021-05-16: 81 mg via ORAL
  Filled 2021-05-15: qty 1

## 2021-05-15 MED ORDER — TRANEXAMIC ACID-NACL 1000-0.7 MG/100ML-% IV SOLN
1000.0000 mg | INTRAVENOUS | Status: AC
  Administered 2021-05-15: 1000 mg via INTRAVENOUS
  Filled 2021-05-15: qty 100

## 2021-05-15 MED ORDER — DEXAMETHASONE SODIUM PHOSPHATE 10 MG/ML IJ SOLN
8.0000 mg | Freq: Once | INTRAMUSCULAR | Status: AC
  Administered 2021-05-15: 8 mg via INTRAVENOUS

## 2021-05-15 MED ORDER — LACTATED RINGERS IV SOLN: INTRAVENOUS | Status: DC

## 2021-05-15 MED ORDER — ONDANSETRON HCL 4 MG/2ML IJ SOLN
INTRAMUSCULAR | Status: DC | PRN
  Administered 2021-05-15: 4 mg via INTRAVENOUS

## 2021-05-15 MED ORDER — DEXAMETHASONE SODIUM PHOSPHATE 10 MG/ML IJ SOLN
INTRAMUSCULAR | Status: AC
  Filled 2021-05-15: qty 1

## 2021-05-15 MED ORDER — CEFAZOLIN SODIUM-DEXTROSE 2-4 GM/100ML-% IV SOLN
2.0000 g | INTRAVENOUS | Status: AC
  Administered 2021-05-15: 2 g via INTRAVENOUS
  Filled 2021-05-15: qty 100

## 2021-05-15 MED ORDER — FENTANYL CITRATE (PF) 100 MCG/2ML IJ SOLN
INTRAMUSCULAR | Status: DC | PRN
  Administered 2021-05-15: 50 ug via INTRAVENOUS

## 2021-05-15 MED ORDER — PHENYLEPHRINE HCL-NACL 20-0.9 MG/250ML-% IV SOLN
INTRAVENOUS | Status: DC | PRN
  Administered 2021-05-15: 30 ug/min via INTRAVENOUS

## 2021-05-15 MED ORDER — NITROGLYCERIN 0.4 MG SL SUBL: 0.4000 mg | SUBLINGUAL_TABLET | SUBLINGUAL | Status: DC | PRN

## 2021-05-15 MED ORDER — LINAGLIPTIN 5 MG PO TABS
5.0000 mg | ORAL_TABLET | Freq: Every day | ORAL | Status: DC
  Administered 2021-05-16: 5 mg via ORAL
  Filled 2021-05-15: qty 1

## 2021-05-15 MED ORDER — MIDAZOLAM HCL 2 MG/2ML IJ SOLN
1.0000 mg | INTRAMUSCULAR | Status: DC
  Filled 2021-05-15: qty 2

## 2021-05-15 MED ORDER — PANTOPRAZOLE SODIUM 40 MG PO TBEC
40.0000 mg | DELAYED_RELEASE_TABLET | Freq: Every day | ORAL | Status: DC
  Administered 2021-05-16: 40 mg via ORAL
  Filled 2021-05-15: qty 1

## 2021-05-15 MED ORDER — KETOROLAC TROMETHAMINE 30 MG/ML IJ SOLN
INTRAMUSCULAR | Status: AC
  Filled 2021-05-15: qty 1

## 2021-05-15 MED ORDER — DOCUSATE SODIUM 100 MG PO CAPS
100.0000 mg | ORAL_CAPSULE | Freq: Two times a day (BID) | ORAL | Status: DC
  Administered 2021-05-15 – 2021-05-16 (×2): 100 mg via ORAL
  Filled 2021-05-15 (×2): qty 1

## 2021-05-15 MED ORDER — PHENOL 1.4 % MT LIQD: 1.0000 | OROMUCOSAL | Status: DC | PRN

## 2021-05-15 MED ORDER — SODIUM CHLORIDE (PF) 0.9 % IJ SOLN
INTRAMUSCULAR | Status: DC | PRN
  Administered 2021-05-15: 30 mL

## 2021-05-15 MED ORDER — BUPIVACAINE-EPINEPHRINE (PF) 0.25% -1:200000 IJ SOLN
INTRAMUSCULAR | Status: AC
  Filled 2021-05-15: qty 30

## 2021-05-15 MED ORDER — CEFAZOLIN SODIUM-DEXTROSE 2-4 GM/100ML-% IV SOLN
2.0000 g | Freq: Four times a day (QID) | INTRAVENOUS | Status: AC
  Administered 2021-05-15 (×2): 2 g via INTRAVENOUS
  Filled 2021-05-15 (×2): qty 100

## 2021-05-15 MED ORDER — ACETAMINOPHEN 500 MG PO TABS
1000.0000 mg | ORAL_TABLET | Freq: Four times a day (QID) | ORAL | Status: DC
  Administered 2021-05-15 – 2021-05-16 (×4): 1000 mg via ORAL
  Filled 2021-05-15 (×5): qty 2

## 2021-05-15 MED ORDER — PROPOFOL 1000 MG/100ML IV EMUL
INTRAVENOUS | Status: AC
  Filled 2021-05-15: qty 100

## 2021-05-15 MED ORDER — OXYCODONE HCL 5 MG PO TABS
5.0000 mg | ORAL_TABLET | ORAL | Status: DC | PRN
  Administered 2021-05-15 – 2021-05-16 (×4): 10 mg via ORAL
  Filled 2021-05-15 (×5): qty 2

## 2021-05-15 MED ORDER — ROPIVACAINE HCL 5 MG/ML IJ SOLN
INTRAMUSCULAR | Status: DC | PRN
  Administered 2021-05-15: 30 mL via PERINEURAL

## 2021-05-15 MED ORDER — DEXAMETHASONE SODIUM PHOSPHATE 10 MG/ML IJ SOLN
10.0000 mg | Freq: Once | INTRAMUSCULAR | Status: AC
  Administered 2021-05-16: 10 mg via INTRAVENOUS
  Filled 2021-05-15: qty 1

## 2021-05-15 MED ORDER — LOSARTAN POTASSIUM 50 MG PO TABS: 100.0000 mg | ORAL_TABLET | Freq: Every day | ORAL | Status: DC

## 2021-05-15 MED ORDER — METFORMIN HCL 500 MG PO TABS
1000.0000 mg | ORAL_TABLET | Freq: Two times a day (BID) | ORAL | Status: DC
  Administered 2021-05-16: 1000 mg via ORAL
  Filled 2021-05-15: qty 2

## 2021-05-15 MED ORDER — HYDROMORPHONE HCL 1 MG/ML IJ SOLN: 0.5000 mg | INTRAMUSCULAR | Status: DC | PRN

## 2021-05-15 MED ORDER — SODIUM CHLORIDE 0.9 % IR SOLN
Status: DC | PRN
  Administered 2021-05-15: 1000 mL

## 2021-05-15 MED ORDER — PROPOFOL 500 MG/50ML IV EMUL
INTRAVENOUS | Status: DC | PRN
Start: 2021-05-15 — End: 2021-05-15
  Administered 2021-05-15: 75 ug/kg/min via INTRAVENOUS

## 2021-05-15 MED ORDER — SODIUM CHLORIDE (PF) 0.9 % IJ SOLN
INTRAMUSCULAR | Status: AC
  Filled 2021-05-15: qty 30

## 2021-05-15 MED ORDER — FENTANYL CITRATE (PF) 100 MCG/2ML IJ SOLN
INTRAMUSCULAR | Status: AC
  Filled 2021-05-15: qty 2

## 2021-05-15 MED ORDER — METHOCARBAMOL 500 MG PO TABS
500.0000 mg | ORAL_TABLET | Freq: Four times a day (QID) | ORAL | Status: DC | PRN
  Administered 2021-05-15: 500 mg via ORAL
  Filled 2021-05-15: qty 1

## 2021-05-15 MED ORDER — PROPOFOL 10 MG/ML IV BOLUS
INTRAVENOUS | Status: DC | PRN
  Administered 2021-05-15: 30 mg via INTRAVENOUS

## 2021-05-15 MED ORDER — MENTHOL 3 MG MT LOZG: 1.0000 | LOZENGE | OROMUCOSAL | Status: DC | PRN

## 2021-05-15 MED ORDER — FERROUS SULFATE 325 (65 FE) MG PO TABS
325.0000 mg | ORAL_TABLET | Freq: Three times a day (TID) | ORAL | Status: DC
  Administered 2021-05-16 (×2): 325 mg via ORAL
  Filled 2021-05-15 (×2): qty 1

## 2021-05-15 MED ORDER — METHOCARBAMOL 500 MG IVPB - SIMPLE MED
500.0000 mg | Freq: Four times a day (QID) | INTRAVENOUS | Status: DC | PRN
  Filled 2021-05-15: qty 50

## 2021-05-15 MED ORDER — BUPIVACAINE-EPINEPHRINE (PF) 0.25% -1:200000 IJ SOLN
INTRAMUSCULAR | Status: DC | PRN
  Administered 2021-05-15: 30 mL

## 2021-05-15 SURGICAL SUPPLY — 62 items
ADH SKN CLS APL DERMABOND .7 (GAUZE/BANDAGES/DRESSINGS) ×1
ATTUNE MED ANAT PAT 38 KNEE (Knees) ×1 IMPLANT
ATTUNE PS FEM LT SZ 7 CEM KNEE (Femur) ×1 IMPLANT
ATTUNE PSRP INSR SZ7 6 KNEE (Insert) ×1 IMPLANT
BAG COUNTER SPONGE SURGICOUNT (BAG) ×1 IMPLANT
BAG SPEC THK2 15X12 ZIP CLS (MISCELLANEOUS) ×1
BAG SPNG CNTER NS LX DISP (BAG) ×1
BAG ZIPLOCK 12X15 (MISCELLANEOUS) ×1 IMPLANT
BASE TIBIAL ROT PLAT SZ 7 KNEE (Knees) IMPLANT
BLADE SAW SGTL 11.0X1.19X90.0M (BLADE) IMPLANT
BLADE SAW SGTL 13.0X1.19X90.0M (BLADE) ×2 IMPLANT
BNDG ELASTIC 6X5.8 VLCR STR LF (GAUZE/BANDAGES/DRESSINGS) ×2 IMPLANT
BOWL SMART MIX CTS (DISPOSABLE) ×2 IMPLANT
BSPLAT TIB 7 CMNT ROT PLAT STR (Knees) ×1 IMPLANT
CEMENT HV SMART SET (Cement) ×2 IMPLANT
CUFF TOURN SGL QUICK 34 (TOURNIQUET CUFF) ×2
CUFF TRNQT CYL 34X4.125X (TOURNIQUET CUFF) ×1 IMPLANT
DERMABOND ADVANCED (GAUZE/BANDAGES/DRESSINGS) ×1
DERMABOND ADVANCED .7 DNX12 (GAUZE/BANDAGES/DRESSINGS) ×1 IMPLANT
DRAPE SHEET LG 3/4 BI-LAMINATE (DRAPES) ×2 IMPLANT
DRAPE STERI URO 23X35 APER SZ5 (DRAPES) ×1 IMPLANT
DRAPE U-SHAPE 47X51 STRL (DRAPES) ×2 IMPLANT
DRESSING AQUACEL AG SP 3.5X10 (GAUZE/BANDAGES/DRESSINGS) ×1 IMPLANT
DRSG AQUACEL AG ADV 3.5X10 (GAUZE/BANDAGES/DRESSINGS) ×1 IMPLANT
DRSG AQUACEL AG SP 3.5X10 (GAUZE/BANDAGES/DRESSINGS) ×2
DURAPREP 26ML APPLICATOR (WOUND CARE) ×4 IMPLANT
ELECT REM PT RETURN 15FT ADLT (MISCELLANEOUS) ×2 IMPLANT
FACESHIELD WRAPAROUND (MASK) ×4 IMPLANT
FACESHIELD WRAPAROUND OR TEAM (MASK) ×2 IMPLANT
GLOVE BIO SURGEON STRL SZ 6 (GLOVE) ×3 IMPLANT
GLOVE BIOGEL PI IND STRL 6.5 (GLOVE) ×1 IMPLANT
GLOVE BIOGEL PI IND STRL 7.5 (GLOVE) ×1 IMPLANT
GLOVE BIOGEL PI INDICATOR 6.5 (GLOVE) ×2
GLOVE BIOGEL PI INDICATOR 7.5 (GLOVE) ×2
GLOVE ORTHO TXT STRL SZ7.5 (GLOVE) ×4 IMPLANT
GOWN STRL REUS W/ TWL LRG LVL3 (GOWN DISPOSABLE) ×2 IMPLANT
GOWN STRL REUS W/TWL LRG LVL3 (GOWN DISPOSABLE) ×4
HANDPIECE INTERPULSE COAX TIP (DISPOSABLE) ×2
HOLDER FOLEY CATH W/STRAP (MISCELLANEOUS) ×1 IMPLANT
KIT TURNOVER KIT A (KITS) ×1 IMPLANT
MANIFOLD NEPTUNE II (INSTRUMENTS) ×2 IMPLANT
NDL SAFETY ECLIPSE 18X1.5 (NEEDLE) IMPLANT
NEEDLE HYPO 18GX1.5 SHARP (NEEDLE) ×2
NS IRRIG 1000ML POUR BTL (IV SOLUTION) ×2 IMPLANT
PACK TOTAL KNEE CUSTOM (KITS) ×2 IMPLANT
PROTECTOR NERVE ULNAR (MISCELLANEOUS) ×2 IMPLANT
SET HNDPC FAN SPRY TIP SCT (DISPOSABLE) ×1 IMPLANT
SET PAD KNEE POSITIONER (MISCELLANEOUS) ×2 IMPLANT
SPIKE FLUID TRANSFER (MISCELLANEOUS) ×4 IMPLANT
SUT MNCRL AB 4-0 PS2 18 (SUTURE) ×2 IMPLANT
SUT STRATAFIX PDS+ 0 24IN (SUTURE) ×2 IMPLANT
SUT VIC AB 1 CT1 36 (SUTURE) ×2 IMPLANT
SUT VIC AB 2-0 CT1 27 (SUTURE) ×4
SUT VIC AB 2-0 CT1 TAPERPNT 27 (SUTURE) ×2 IMPLANT
SYR 3ML LL SCALE MARK (SYRINGE) ×2 IMPLANT
TIBIAL BASE ROT PLAT SZ 7 KNEE (Knees) ×2 IMPLANT
TOWEL GREEN STERILE FF (TOWEL DISPOSABLE) ×2 IMPLANT
TRAY CATH INTERMITTENT SS 16FR (CATHETERS) ×2 IMPLANT
TRAY FOLEY MTR SLVR 16FR STAT (SET/KITS/TRAYS/PACK) ×2 IMPLANT
TUBE SUCTION HIGH CAP CLEAR NV (SUCTIONS) ×2 IMPLANT
WATER STERILE IRR 1000ML POUR (IV SOLUTION) ×4 IMPLANT
WRAP KNEE MAXI GEL POST OP (GAUZE/BANDAGES/DRESSINGS) ×2 IMPLANT

## 2021-05-15 NOTE — Evaluation (Signed)
Physical Therapy Evaluation ?Patient Details ?Name: Mark West. ?MRN: [id number] ?DOB: 1940/08/04 ?Today's Date: 05/15/2021 ? ?History of Present Illness ? 81 y.o. male admitted 05/15/21 for L TKA. PMH includes: lymphoma, blind R eye, HTN, colon cancer, CAD, DM, obesity, L distal radius fracture s/p ORIF 2017.  ?Clinical Impression ? Pt is s/p TKA resulting in the deficits listed below (see PT Problem List). Pt is mobilizing well, he ambulated 70' with RW without L knee buckling. Initiated TKA HEP. Good progress expected. Pt puts forth good effort.  Pt will benefit from skilled PT to increase their independence and safety with mobility to allow discharge to the venue listed below.  ?   ?   ? ?Recommendations for follow up therapy are one component of a multi-disciplinary discharge planning process, led by the attending physician.  Recommendations may be updated based on patient status, additional functional criteria and insurance authorization. ? ?Follow Up Recommendations Follow physician's recommendations for discharge plan and follow up therapies ? ?  ?Assistance Recommended at Discharge Set up Supervision/Assistance  ?Patient can return home with the following ? Assist for transportation;Assistance with cooking/housework;A little help with bathing/dressing/bathroom;Help with stairs or ramp for entrance ? ?  ?Equipment Recommendations None recommended by PT  ?Recommendations for Other Services ?    ?  ?Functional Status Assessment Patient has had a recent decline in their functional status and demonstrates the ability to make significant improvements in function in a reasonable and predictable amount of time.  ? ?  ?Precautions / Restrictions Precautions ?Precautions: Knee ?Restrictions ?Weight Bearing Restrictions: No ?LLE Weight Bearing: Weight bearing as tolerated  ? ?  ? ?Mobility ? Bed Mobility ?Overal bed mobility: Needs Assistance ?Bed Mobility: Supine to Sit ?  ?  ?Supine to sit: Min assist ?  ?   ?General bed mobility comments: min A to raise trunk ?  ? ?Transfers ?Overall transfer level: Needs assistance ?Equipment used: Rolling walker (2 wheels) ?Transfers: Sit to/from Stand ?Sit to Stand: Min guard ?  ?  ?  ?  ?  ?General transfer comment: VCs hand placement ?  ? ?Ambulation/Gait ?Ambulation/Gait assistance: Min guard ?Gait Distance (Feet): 50 Feet ?Assistive device: Rolling walker (2 wheels) ?Gait Pattern/deviations: Step-to pattern, Decreased step length - right, Decreased step length - left ?Gait velocity: decr ?  ?  ?General Gait Details: steady, VCs sequencing, no loss of balance, no buckling of LLE ? ?Stairs ?  ?  ?  ?  ?  ? ?Wheelchair Mobility ?  ? ?Modified Rankin (Stroke Patients Only) ?  ? ?  ? ?Balance Overall balance assessment: Modified Independent ?  ?  ?  ?  ?  ?  ?  ?  ?  ?  ?  ?  ?  ?  ?  ?  ?  ?  ?   ? ? ? ?Pertinent Vitals/Pain Pain Assessment ?Pain Assessment: 0-10 ?Pain Score: 3  ?Pain Location: L knee ?Pain Descriptors / Indicators: Sore ?Pain Intervention(s): Limited activity within patient's tolerance, Monitored during session, Premedicated before session, Ice applied, Repositioned  ? ? ?Home Living Family/patient expects to be discharged to:: Private residence ?Living Arrangements: Spouse/significant other ?Available Help at Discharge: Family;Available 24 hours/day ?  ?Home Access: Stairs to enter ?  ?Entrance Stairs-Number of Steps: 1 ?  ?Home Layout: One level ?Home Equipment: Conservation officer, nature (2 wheels) ?   ?  ?Prior Function Prior Level of Function : Independent/Modified Independent;Driving ?  ?  ?  ?  ?  ?  ?  Mobility Comments: independent without AD, no falls in past 6 months ?ADLs Comments: independent ?  ? ? ?Hand Dominance  ?   ? ?  ?Extremity/Trunk Assessment  ? Upper Extremity Assessment ?Upper Extremity Assessment: Overall WFL for tasks assessed ?  ? ?Lower Extremity Assessment ?Lower Extremity Assessment: LLE deficits/detail ?LLE Deficits / Details: knee ext 3/5, AAROM  5-45* knee ?LLE Sensation: WNL ?LLE Coordination: WNL ?  ? ?Cervical / Trunk Assessment ?Cervical / Trunk Assessment: Normal  ?Communication  ? Communication: No difficulties  ?Cognition Arousal/Alertness: Awake/alert ?Behavior During Therapy: [facility] for tasks assessed/performed ?Overall Cognitive Status: Within Functional Limits for tasks assessed ?  ?  ?  ?  ?  ?  ?  ?  ?  ?  ?  ?  ?  ?  ?  ?  ?  ?  ?  ? ?  ?General Comments   ? ?  ?Exercises Total Joint Exercises ?Ankle Circles/Pumps: AROM, Both, 10 reps, Supine ?Heel Slides: AAROM, Left, 5 reps, Supine ?Long Arc Quad: AROM, Left, 5 reps, Seated  ? ?Assessment/Plan  ?  ?PT Assessment Patient needs continued PT services  ?PT Problem List Decreased mobility;Decreased strength;Decreased range of motion;Decreased knowledge of use of DME;Pain ? ?   ?  ?PT Treatment Interventions DME instruction;Gait training;Stair training;Therapeutic exercise;Patient/family education;Therapeutic activities   ? ?PT Goals (Current goals can be found in the Care Plan section)  ?Acute Rehab PT Goals ?Patient Stated Goal: golf ?PT Goal Formulation: With patient/family ?Time For Goal Achievement: 05/22/21 ?Potential to Achieve Goals: Good ? ?  ?Frequency 7X/week ?  ? ? ?Co-evaluation   ?  ?  ?  ?  ? ? ?  ?AM-PAC PT "6 Clicks" Mobility  ?Outcome Measure Help needed turning from your back to your side while in a flat bed without using bedrails?: A Little ?Help needed moving from lying on your back to sitting on the side of a flat bed without using bedrails?: A Little ?Help needed moving to and from a bed to a chair (including a wheelchair)?: A Little ?Help needed standing up from a chair using your arms (e.g., wheelchair or bedside chair)?: A Little ?Help needed to walk in hospital room?: A Little ?Help needed climbing 3-5 steps with a railing? : A Lot ?6 Click Score: 17 ? ?  ?End of Session Equipment Utilized During Treatment: Gait belt ?Activity Tolerance: Patient tolerated treatment  well ?Patient left: in chair;with chair alarm set;with call bell/phone within reach;with family/visitor present ?Nurse Communication: Mobility status ?PT Visit Diagnosis: Difficulty in walking, not elsewhere classified (R26.2);Pain ?Pain - Right/Left: Left ?Pain - part of body: Knee ?  ? ?Time: 7425-9563 ?PT Time Calculation (min) (ACUTE ONLY): 24 min ? ? ?Charges:   PT Evaluation ?$PT Eval Moderate Complexity: 1 Mod ?PT Treatments ?$Gait Training: 8-22 mins ?  ?   ? ? ?Blondell Reveal Kistler PT 05/15/2021  ?Acute Rehabilitation Services ?Pager [phone] ?Office [phone] ? ? ?

## 2021-05-15 NOTE — Op Note (Signed)
NAME:  Mark West.                  ?   ? MEDICAL RECORD NO.:  [id number]  ?   ?                        FACILITY:  [facility]  ?   ? PHYSICIAN:  Pietro Cassis. Alvan Dame, M.D.  DATE OF BIRTH:  07-29-1940  ?   ? DATE OF PROCEDURE:  05/15/2021   ?  ?   ?                             OPERATIVE REPORT  ?   ?   ? PREOPERATIVE DIAGNOSIS:  Left knee osteoarthritis.  ?   ? POSTOPERATIVE DIAGNOSIS:  Left knee osteoarthritis.  ?   ? FINDINGS:  The patient was noted to have complete loss of cartilage and  ? bone-on-bone arthritis with associated osteophytes in the medial and patellofemoral compartments of  ? the knee with a significant synovitis and associated effusion.  The patient had failed months of conservative treatment including medications, injection therapy, activity modification. ?   ? PROCEDURE:  Left total knee replacement.  ?   ? COMPONENTS USED:  DePuy Attune rotating platform posterior stabilized knee  ? system, a size 7 femur, 7 tibia, size 6 mm PS AOX insert, and 38 anatomic patellar  ? button.  ?   ? SURGEON:  Pietro Cassis. Alvan Dame, M.D.  ?   ? ASSISTANT:  Costella Hatcher, PA-C.  ?   ? ANESTHESIA:  Regional and Spinal.  ?   ? SPECIMENS:  None.  ?   ? COMPLICATION:  None.  ?   ? DRAINS:  None. ? ?EBL: <100 cc  ?   ? TOURNIQUET TIME:   ?Total Tourniquet Time Documented: ?Thigh (Left) - 29 minutes ?Total: Thigh (Left) - 29 minutes ? .  ?   ? The patient was stable to the recovery room.  ?   ? INDICATION FOR PROCEDURE:  Mark West. is a 81 y.o. male patient of  ? mine.  The patient had been seen, evaluated, and treated for months conservatively in the  ? office with medication, activity modification, and injections.  The patient had  ? radiographic changes of bone-on-bone arthritis with endplate sclerosis and osteophytes noted.  Based on the radiographic changes and failed conservative measures, the patient  ? decided to proceed with definitive treatment, total knee replacement.  Risks of infection, DVT, component  failure, need for revision surgery, neurovascular injury were reviewed in the office setting.  The postop course was reviewed stressing the efforts to maximize post-operative satisfaction and function.  Consent was obtained for benefit of pain  ? relief.  ?   ? PROCEDURE IN DETAIL:  The patient was brought to the operative theater.  ? Once adequate anesthesia, preoperative antibiotics, 2 gm of Ancef,1 gm of Tranexamic Acid, and 10 mg of Decadron administered, the patient was positioned supine with a left thigh tourniquet placed.  The  ?left lower extremity was prepped and draped in sterile fashion.  A time-  ? out was performed identifying the patient, planned procedure, and the appropriate extremity.  ?   ? The left lower extremity was placed in the [facility] leg holder.  The leg was  ? exsanguinated, tourniquet elevated to 250 mmHg.  A midline incision was  ?  made followed by median parapatellar arthrotomy.  Following initial  ? exposure, attention was first directed to the patella.  Precut  ? measurement was noted to be 25 mm.  I resected down to 14 mm and used a  ? 38 anatomic patellar button to restore patellar height as well as cover the cut surface.  ?   ? The lug holes were drilled and a metal shim was placed to protect the  ? patella from retractors and saw blade during the procedure.  ?   ? At this point, attention was now directed to the femur.  The femoral  ? canal was opened with a drill, irrigated to try to prevent fat emboli.  An  ? intramedullary rod was passed at 5 degrees valgus, 11 mm of bone was  ? resected off the distal femur due to significant pre-operative flexion contracture.  Following this resection, the tibia was  ? subluxated anteriorly.  Using the extramedullary guide, 2 mm of bone was resected off  ? the proximal medial tibia.  We confirmed the gap would be  ? stable medially and laterally with a size 5 spacer block as well as confirmed that the tibial cut was perpendicular in the coronal  plane, checking with an alignment rod.  ?   ? Once this was done, I sized the femur to be a size 7 in the anterior-  ? posterior dimension, chose a standard component based on medial and  ? lateral dimension.  The size 7 rotation block was then pinned in  ? position anterior referenced using the C-clamp to set rotation.  The  ? anterior, posterior, and  chamfer cuts were made without difficulty nor  ? notching making certain that I was along the anterior cortex to help  ? with flexion gap stability.  ?   ? The final box cut was made off the lateral aspect of distal femur.  ?   ? At this point, the tibia was sized to be a size 7.  The size 7 tray was  ? then pinned in position through the medial third of the tubercle,  ? drilled, and keel punched.  Trial reduction was now carried with a 7 femur, ? 7 tibia, a size 6 mm PS insert, and the 38 anatomic patella botton.  The knee was brought to full extension with good flexion stability with the patella  ? tracking through the trochlea without application of pressure.  Given  ? all these findings the trial components removed.  Final components were  ? opened and cement was mixed.  The knee was irrigated with normal saline solution and pulse lavage.  The synovial lining was  ? then injected with 30 cc of 0.25% Marcaine with epinephrine, 1 cc of Toradol and 30 cc of NS for a total of 61 cc.  ?   ?Final implants were then cemented onto cleaned and dried cut surfaces of bone with the knee brought to extension with a size 6 mm PS trial insert.  ?   ? Once the cement had fully cured, excess cement was removed  ? throughout the knee.  I confirmed that I was satisfied with the range of  ? motion and stability, and the final size 6 mm PS AOX insert was chosen.  It was  ? placed into the knee.  ?   ? The tourniquet had been let down at 29 minutes.  No significant  ? hemostasis was required.  The extensor mechanism  was then reapproximated using #1 Vicryl and #1 Stratafix sutures with  the knee  ? in flexion.  The  ? remaining wound was closed with 2-0 Vicryl and running 4-0 Monocryl.  ? The knee was cleaned, dried, dressed sterilely using Dermabond and  ? Aquacel dressing.  The patient was then  ? brought to recovery room in stable condition, tolerating the procedure  ? well.  ? ?Please note that Physician Assistant, Costella Hatcher, PA-C was present for the entirety of the case, and was utilized for pre-operative positioning, peri-operative retractor management, general facilitation of the procedure and for primary wound closure at the end of the case. ?   ?   ?   ?   ? Pietro Cassis Alvan Dame, M.D.  ? ? ?05/15/2021 10:56 AM  ?

## 2021-05-15 NOTE — Anesthesia Postprocedure Evaluation (Signed)
Anesthesia Post Note ? ?Patient: Mark West. ? ?Procedure(s) Performed: TOTAL KNEE ARTHROPLASTY (Left: Knee) ? ?  ? ?Patient location during evaluation: PACU ?Anesthesia Type: Spinal ?Level of consciousness: oriented and awake and alert ?Pain management: pain level controlled ?Vital Signs Assessment: post-procedure vital signs reviewed and stable ?Respiratory status: spontaneous breathing, respiratory function stable and patient connected to nasal cannula oxygen ?Cardiovascular status: blood pressure returned to baseline and stable ?Postop Assessment: no headache, no backache and no apparent nausea or vomiting ?Anesthetic complications: no ? ? ?No notable events documented. ? ?Last Vitals:  ?Vitals:  ? 05/15/21 1314 05/15/21 1512  ?BP: (!) 143/83 (!) 178/60  ?Pulse: (!) 58 60  ?Resp: 16 18  ?Temp: (!) 36.4 ?C 36.6 ?C  ?SpO2: 100% 99%  ?  ?Last Pain:  ?Vitals:  ? 05/15/21 1512  ?TempSrc: Oral  ?PainSc:   ? ? ?  ?  ?  ?  ?  ?  ? ?Effie Berkshire ? ? ? ? ?

## 2021-05-15 NOTE — Interval H&P Note (Signed)
History and Physical Interval Note: ? ?05/15/2021 ?8:33 AM ? ?Mark West.  has presented today for surgery, with the diagnosis of Left knee osteoarthritis.  The various methods of treatment have been discussed with the patient and family. After consideration of risks, benefits and other options for treatment, the patient has consented to  Procedure(s): ?TOTAL KNEE ARTHROPLASTY (Left) as a surgical intervention.  The patient's history has been reviewed, patient examined, no change in status, stable for surgery.  I have reviewed the patient's chart and labs.  Questions were answered to the patient's satisfaction.   ? ? ?Mauri Pole ? ? ?

## 2021-05-15 NOTE — Progress Notes (Signed)
AssistedDr. Smith Robert with left, adductor canal block. Side rails up, monitors on throughout procedure. See vital signs in flow sheet. Tolerated Procedure well. ? ?

## 2021-05-15 NOTE — Discharge Instructions (Signed)

## 2021-05-15 NOTE — Anesthesia Procedure Notes (Signed)
Spinal ? ?Start time: 05/15/2021 9:41 AM ?End time: 05/15/2021 9:43 AM ?Reason for block: surgical anesthesia ?Staffing ?Performed: anesthesiologist  ?Anesthesiologist: Effie Berkshire, MD ?Preanesthetic Checklist ?Completed: patient identified, IV checked, site marked, risks and benefits discussed, surgical consent, monitors and equipment checked, pre-op evaluation and timeout performed ?Spinal Block ?Patient position: sitting ?Prep: DuraPrep and site prepped and draped ?Location: L3-4 ?Injection technique: single-shot ?Needle ?Needle type: Pencan  ?Needle gauge: 24 G ?Needle length: 10 cm ?Needle insertion depth: 10 cm ?Additional Notes ?Patient tolerated well. No immediate complications. ? ?Functioning IV was confirmed and monitors were applied. Sterile prep and drape, including hand hygiene and sterile gloves were used. The patient was positioned and the back was prepped. The skin was anesthetized with lidocaine. Free flow of clear CSF was obtained prior to injecting local anesthetic into the CSF. The spinal needle aspirated freely following injection. The needle was carefully withdrawn. The patient tolerated the procedure well. ? ? ? ? ?

## 2021-05-15 NOTE — Anesthesia Procedure Notes (Signed)
Anesthesia Regional Block: Adductor canal block  ? ?Pre-Anesthetic Checklist: , timeout performed,  Correct Patient, Correct Site, Correct Laterality,  Correct Procedure, Correct Position, site marked,  Risks and benefits discussed,  Surgical consent,  Pre-op evaluation,  At surgeon's request and post-op pain management ? ?Laterality: Left ? ?Prep: chloraprep     ?  ?Needles:  ?Injection technique: Single-shot ? ?Needle Type: Echogenic Stimulator Needle   ? ? ?Needle Length: 9cm  ?Needle Gauge: 21  ? ? ? ?Additional Needles: ? ? ?Procedures:,,,, ultrasound used (permanent image in chart),,    ?Narrative:  ?Start time: 05/15/2021 9:05 AM ?End time: 05/15/2021 9:10 AM ?Injection made incrementally with aspirations every 5 mL. ? ?Performed by: Personally  ?Anesthesiologist: Effie Berkshire, MD ? ?Additional Notes: ?Patient tolerated the procedure well. Local anesthetic introduced in an incremental fashion under minimal resistance after negative aspirations. No paresthesias were elicited. After completion of the procedure, no acute issues were identified and patient continued to be monitored by RN.  ? ? ? ? ? ?

## 2021-05-15 NOTE — Transfer of Care (Signed)
Immediate Anesthesia Transfer of Care Note ? ?Patient: Mark West. ? ?Procedure(s) Performed: TOTAL KNEE ARTHROPLASTY (Left: Knee) ? ?Patient Location: PACU ? ?Anesthesia Type:MAC and Spinal ? ?Level of Consciousness: awake, alert , oriented and patient cooperative ? ?Airway & Oxygen Therapy: Patient Spontanous Breathing and Patient connected to face mask oxygen ? ?Post-op Assessment: Report given to RN and Post -op Vital signs reviewed and stable ? ?Post vital signs: Reviewed and stable ? ?Last Vitals:  ?Vitals Value Taken Time  ?BP 92/78 05/15/21 1125  ?Temp    ?Pulse 62 05/15/21 1129  ?Resp 15 05/15/21 1129  ?SpO2 100 % 05/15/21 1129  ?Vitals shown include unvalidated device data. ? ?Last Pain:  ?Vitals:  ? 05/15/21 0747  ?TempSrc:   ?PainSc: 5   ?   ? ?Patients Stated Pain Goal: 4 (05/15/21 0747) ? ?Complications: No notable events documented. ?

## 2021-05-15 NOTE — Care Plan (Signed)
Ortho Bundle Case Management Note ? ?Patient Details  ?Name: Mark West. ?MRN: [id number] ?Date of Birth: 08/08/1940 ? ?                ?L TKA on 05-15-21 DCP: Home with wife DME: No needs, has a RW PT: EO on 05-18-21 ? ? ?DME Arranged:  N/A ?DME Agency:    ? ?HH Arranged:    ?[place] Agency:    ? ?Additional Comments: ?Please contact me with any questions of if this plan should need to change. ? ?Larwance Rote  [phone] ?05/15/2021, 8:59 AM ?  ?

## 2021-05-15 NOTE — Anesthesia Procedure Notes (Signed)
Procedure Name: [place] ?Date/Time: 05/15/2021 9:50 AM ?Performed by: Claudia Desanctis, CRNA ?Pre-anesthesia Checklist: Patient identified, Emergency Drugs available, Suction available and Patient being monitored ?Patient Re-evaluated:Patient Re-evaluated prior to induction ?Oxygen Delivery Method: Simple face mask ? ? ? ? ?

## 2021-05-16 ENCOUNTER — Encounter (HOSPITAL_COMMUNITY): Payer: Self-pay | Admitting: Orthopedic Surgery

## 2021-05-16 DIAGNOSIS — M1712 Unilateral primary osteoarthritis, left knee: Secondary | ICD-10-CM | POA: Diagnosis not present

## 2021-05-16 DIAGNOSIS — Z96652 Presence of left artificial knee joint: Secondary | ICD-10-CM | POA: Diagnosis not present

## 2021-05-16 LAB — BASIC METABOLIC PANEL
Anion gap: 7 (ref 5–15)
BUN: 43 mg/dL — ABNORMAL HIGH (ref 8–23)
CO2: 19 mmol/L — ABNORMAL LOW (ref 22–32)
Calcium: 8.6 mg/dL — ABNORMAL LOW (ref 8.9–10.3)
Chloride: 112 mmol/L — ABNORMAL HIGH (ref 98–111)
Creatinine, Ser: 1.85 mg/dL — ABNORMAL HIGH (ref 0.61–1.24)
GFR, Estimated: 37 mL/min — ABNORMAL LOW (ref >60)
Glucose, Bld: 185 mg/dL — ABNORMAL HIGH (ref 70–99)
Potassium: 5.1 mmol/L (ref 3.5–5.1)
Sodium: 138 mmol/L (ref 135–145)

## 2021-05-16 LAB — CBC
HCT: 33.9 % — ABNORMAL LOW (ref 39.0–52.0)
Hemoglobin: 10.7 g/dL — ABNORMAL LOW (ref 13.0–17.0)
MCH: 29.9 pg (ref 26.0–34.0)
MCHC: 31.6 g/dL (ref 30.0–36.0)
MCV: 94.7 fL (ref 80.0–100.0)
Platelets: 157 10*3/uL (ref 150–400)
RBC: 3.58 MIL/uL — ABNORMAL LOW (ref 4.22–5.81)
RDW: 13.5 % (ref 11.5–15.5)
WBC: 9.9 10*3/uL (ref 4.0–10.5)
nRBC: 0 % (ref 0.0–0.2)

## 2021-05-16 LAB — GLUCOSE, CAPILLARY
Glucose-Capillary: 151 mg/dL — ABNORMAL HIGH (ref 70–99)
Glucose-Capillary: 160 mg/dL — ABNORMAL HIGH (ref 70–99)

## 2021-05-16 MED ORDER — METHOCARBAMOL 500 MG PO TABS
500.0000 mg | ORAL_TABLET | Freq: Four times a day (QID) | ORAL | 0 refills | Status: AC | PRN
Start: 2021-05-16 — End: ?

## 2021-05-16 MED ORDER — DOCUSATE SODIUM 100 MG PO CAPS: 100.0000 mg | ORAL_CAPSULE | Freq: Two times a day (BID) | ORAL | 0 refills | Status: AC

## 2021-05-16 MED ORDER — OXYCODONE HCL 5 MG PO TABS: 5.0000 mg | ORAL_TABLET | ORAL | 0 refills | Status: AC | PRN

## 2021-05-16 MED ORDER — POLYETHYLENE GLYCOL 3350 17 G PO PACK: 17.0000 g | PACK | Freq: Every day | ORAL | 0 refills | Status: AC | PRN

## 2021-05-16 MED ORDER — ACETAMINOPHEN 500 MG PO TABS: 1000.0000 mg | ORAL_TABLET | Freq: Four times a day (QID) | ORAL | 0 refills | Status: AC

## 2021-05-16 NOTE — Progress Notes (Signed)
? ?Subjective: ?1 Day Post-Op Procedure(s) (LRB): ?TOTAL KNEE ARTHROPLASTY (Left) ?Patient reports pain as mild.   ?Patient seen in rounds by Dr. Alvan Dame. ?Patient is well, and has had no acute complaints or problems. No acute events overnight. Foley catheter removed. Patient ambulated 50 feet with PT.  ?We will continue therapy today.  ? ?Objective: ?Vital signs in last 24 hours: ?Temp:  [97.3 ?F (36.3 ?C)-97.8 ?F (36.6 ?C)] 97.3 ?F (36.3 ?C) (05/10 0547) ?Pulse Rate:  [55-65] 62 (05/10 0547) ?Resp:  [12-20] 17 (05/10 0547) ?BP: (96-178)/(31-86) 132/62 (05/10 0547) ?SpO2:  [95 %-100 %] 98 % (05/10 0547) ?Weight:  [110.7 kg] 110.7 kg (05/09 0747) ? ?Intake/Output from previous day: ? ?Intake/Output Summary (Last 24 hours) at 05/16/2021 0718 ?Last data filed at 05/16/2021 0557 ?Gross per 24 hour  ?Intake 2304.33 ml  ?Output 1450 ml  ?Net 854.33 ml  ?  ? ?Intake/Output this shift: ?No intake/output data recorded. ? ?Labs: ?Recent Labs  ?  05/16/21 ?0337  ?HGB 10.7*  ? ?Recent Labs  ?  05/16/21 ?0337  ?WBC 9.9  ?RBC 3.58*  ?HCT 33.9*  ?PLT 157  ? ?Recent Labs  ?  05/16/21 ?0337  ?NA 138  ?K 5.1  ?CL 112*  ?CO2 19*  ?BUN 43*  ?CREATININE 1.85*  ?GLUCOSE 185*  ?CALCIUM 8.6*  ? ?No results for input(s): LABPT, INR in the last 72 hours. ? ?Exam: ?General - Patient is Alert and Oriented ?Extremity - Neurologically intact ?Sensation intact distally ?Intact pulses distally ?Dorsiflexion/Plantar flexion intact ?Dressing - dressing C/D/I ?Motor Function - intact, moving foot and toes well on exam.  ? ?Past Medical History:  ?Diagnosis Date  ? Anemia, iron deficiency 01/26/2011  ? Arthritis   ? Asthma   ? Cancer [facility])   ? nhl, colon ca  ? Carotid artery occlusion   ? right occlusion of carotid with moderate left plaque  ? Coronary artery disease 08/08/2003  ? MI with stent  ? Diabetes mellitus   ? GERD (gastroesophageal reflux disease)   ? History of kidney stones   ? Hyperlipidemia   ? Hypertension   ? Kidney stones 05/08/2010  ?  lithotripsy  ? Myocardial infarction [facility])   ? ? ?Assessment/Plan: ?1 Day Post-Op Procedure(s) (LRB): ?TOTAL KNEE ARTHROPLASTY (Left) ?Principal Problem: ?  S/P total knee arthroplasty, left ? ?Estimated body mass index is 36.03 kg/m? as calculated from the following: ?  Height as of this encounter: '5\' 9"'$  (1.753 m). ?  Weight as of this encounter: 110.7 kg. ?Advance diet ?Up with therapy ?D/C IV fluids ? ? ?Patient's anticipated LOS is less than 2 midnights, meeting these requirements: ?- Younger than 27 ?- Lives within 1 hour of care ?- Has a competent adult at home to recover with post-op recover ?- NO history of ? - Chronic pain requiring opiods ? - Diabetes ? - Coronary Artery Disease ? - Heart failure ? - Heart attack ? - Stroke ? - DVT/VTE ? - Cardiac arrhythmia ? - Respiratory Failure/COPD ? - Renal failure ? - Anemia ? - Advanced Liver disease ? ?  ? ?DVT Prophylaxis - Aspirin ?Weight bearing as tolerated. ? ?Hgb stable at 10.7 this AM. ?Hx of CKD with some elevation in Cr from baseline, Cr 1.85 this AM ? ?Plan is to go Home after hospital stay. Plan for discharge today following 1-2 sessions of PT as long as they are meeting their goals. Patient is scheduled for OPPT. Follow up in the office in 2  weeks.  ? ?Griffith Citron, PA-C ?Orthopedic Surgery ?[phone] ?05/16/2021, 7:18 AM  ?

## 2021-05-16 NOTE — Progress Notes (Signed)
Physical Therapy Treatment ?Patient Details ?Name: Mark West. ?MRN: [id number] ?DOB: May 07, 1940 ?Today's Date: 05/16/2021 ? ? ?History of Present Illness 81 y.o. male admitted 05/15/21 for L TKA. PMH includes: lymphoma, blind R eye, HTN, colon cancer, CAD, DM, obesity, L distal radius fracture s/p ORIF 2017. ? ?  ?PT Comments  ? ? Pt has met PT goals and is ready to DC home from a PT standpoint. He ambulated 200' with a RW, completed stair training and performed TKA HEP with good technique.    ?Recommendations for follow up therapy are one component of a multi-disciplinary discharge planning process, led by the attending physician.  Recommendations may be updated based on patient status, additional functional criteria and insurance authorization. ? ?Follow Up Recommendations ? Follow physician's recommendations for discharge plan and follow up therapies ?  ?  ?Assistance Recommended at Discharge Set up Supervision/Assistance  ?Patient can return home with the following Assist for transportation;Assistance with cooking/housework;A little help with bathing/dressing/bathroom;Help with stairs or ramp for entrance ?  ?Equipment Recommendations ? None recommended by PT  ?  ?Recommendations for Other Services   ? ? ?  ?Precautions / Restrictions Precautions ?Precautions: Knee;Fall ?Precaution Booklet Issued: Yes (comment) ?Precaution Comments: reviewed no pillow under knee ?Restrictions ?Weight Bearing Restrictions: No ?LLE Weight Bearing: Weight bearing as tolerated  ?  ? ?Mobility ? Bed Mobility ?Overal bed mobility: Modified Independent ?Bed Mobility: Supine to Sit ?  ?  ?Supine to sit: Modified independent (Device/Increase time), HOB elevated ?  ?  ?General bed mobility comments: up in recliner ?  ? ?Transfers ?Overall transfer level: Needs assistance ?Equipment used: Rolling walker (2 wheels) ?Transfers: Sit to/from Stand ?Sit to Stand: Modified independent (Device/Increase time) ?  ?  ?  ?  ?  ?General  transfer comment: VCs hand placement ?  ? ?Ambulation/Gait ?Ambulation/Gait assistance: Modified independent (Device/Increase time) ?Gait Distance (Feet): 200 Feet ?Assistive device: Rolling walker (2 wheels) ?Gait Pattern/deviations: Step-to pattern, Decreased step length - right, Decreased step length - left ?Gait velocity: decr ?  ?  ?General Gait Details: steady, no loss of balance ? ? ?Stairs ?Stairs: Yes ?Stairs assistance: Min guard ?Stair Management: One rail Left, Sideways ?Number of Stairs: 3 ?General stair comments: VCs sequencing, sister present, no buckling ? ? ?Wheelchair Mobility ?  ? ?Modified Rankin (Stroke Patients Only) ?  ? ? ?  ?Balance Overall balance assessment: Modified Independent ?  ?  ?  ?  ?  ?  ?  ?  ?  ?  ?  ?  ?  ?  ?  ?  ?  ?  ?  ? ?  ?Cognition Arousal/Alertness: Awake/alert ?Behavior During Therapy: [facility] for tasks assessed/performed ?Overall Cognitive Status: Within Functional Limits for tasks assessed ?  ?  ?  ?  ?  ?  ?  ?  ?  ?  ?  ?  ?  ?  ?  ?  ?  ?  ?  ? ?  ?Exercises Total Joint Exercises ?Ankle Circles/Pumps: AROM, Both, 10 reps, Supine ?Quad Sets: AROM, Left, 10 reps, Supine ?Short Arc Quad: AROM, Left, 10 reps, Supine ?Heel Slides: AAROM, Left, Supine, 10 reps ?Hip ABduction/ADduction: AAROM, Left, 10 reps, Supine ?Straight Leg Raises: AROM, Left, 10 reps, Supine ?Long Arc Quad: AROM, Left, Seated, 10 reps ?Knee Flexion: AAROM, Left, 10 reps, Seated ?Goniometric ROM: 5-80* AAROM L knee ? ?  ?General Comments   ?  ?  ? ?Pertinent Vitals/Pain Pain Assessment ?  Pain Score: 2  ?Pain Location: L knee ?Pain Descriptors / Indicators: Sore ?Pain Intervention(s): Limited activity within patient's tolerance, Monitored during session, Premedicated before session, Ice applied  ? ? ?Home Living   ?  ?  ?  ?  ?  ?  ?  ?  ?  ?   ?  ?Prior Function    ?  ?  ?   ? ?PT Goals (current goals can now be found in the care plan section) Acute Rehab PT Goals ?Patient Stated Goal: golf, attend grand  daughter's graduation in a month ?PT Goal Formulation: With patient/family ?Time For Goal Achievement: 05/22/21 ?Potential to Achieve Goals: Good ?Progress towards PT goals: Progressing toward goals ? ?  ?Frequency ? ? ? 7X/week ? ? ? ?  ?PT Plan Current plan remains appropriate  ? ? ?Co-evaluation   ?  ?  ?  ?  ? ?  ?AM-PAC PT "6 Clicks" Mobility   ?Outcome Measure ? Help needed turning from your back to your side while in a flat bed without using bedrails?: A Little ?Help needed moving from lying on your back to sitting on the side of a flat bed without using bedrails?: None ?Help needed moving to and from a bed to a chair (including a wheelchair)?: None ?Help needed standing up from a chair using your arms (e.g., wheelchair or bedside chair)?: None ?Help needed to walk in hospital room?: None ?Help needed climbing 3-5 steps with a railing? : A Little ?6 Click Score: 22 ? ?  ?End of Session Equipment Utilized During Treatment: Gait belt ?Activity Tolerance: Patient tolerated treatment well ?Patient left: in chair;with call bell/phone within reach;with family/visitor present ?Nurse Communication: Mobility status ?PT Visit Diagnosis: Difficulty in walking, not elsewhere classified (R26.2);Pain ?Pain - Right/Left: Left ?Pain - part of body: Knee ?  ? ? ?Time: 2440-1027 ?PT Time Calculation (min) (ACUTE ONLY): 30 min ? ?Charges:  $Gait Training: 8-22 mins ?$Therapeutic Exercise: 8-22 mins          ?          ? ?Philomena Doheny PT 05/16/2021  ?Acute Rehabilitation Services ?Pager [phone] ?Office [phone] ? ? ?

## 2021-05-16 NOTE — Progress Notes (Signed)
Discharge education/instructions provided by Warren Lacy, RN. Pt not in acute distress, discharged home with belongings accompanied by family. ?

## 2021-05-16 NOTE — Progress Notes (Signed)
Physical Therapy Treatment ?Patient Details ?Name: Mark West. ?MRN: [id number] ?DOB: 1940/05/18 ?Today's Date: 05/16/2021 ? ? ?History of Present Illness 81 y.o. male admitted 05/15/21 for L TKA. PMH includes: lymphoma, blind R eye, HTN, colon cancer, CAD, DM, obesity, L distal radius fracture s/p ORIF 2017. ? ?  ?PT Comments  ? ? Pt is progressing well with mobility, he ambulated 160' with RW, no loss of balance. Stair training completed with daughter present and assisting with management of RW. Instructed pt/daughter in TKA HEP. He reports some mild buckling of L knee with walking and stairs which he was able to manage with BUE support on RW, he is able to perform 10 reps of SLR independently.    ?Recommendations for follow up therapy are one component of a multi-disciplinary discharge planning process, led by the attending physician.  Recommendations may be updated based on patient status, additional functional criteria and insurance authorization. ? ?Follow Up Recommendations ? Follow physician's recommendations for discharge plan and follow up therapies ?  ?  ?Assistance Recommended at Discharge Set up Supervision/Assistance  ?Patient can return home with the following Assist for transportation;Assistance with cooking/housework;A little help with bathing/dressing/bathroom;Help with stairs or ramp for entrance ?  ?Equipment Recommendations ? None recommended by PT  ?  ?Recommendations for Other Services   ? ? ?  ?Precautions / Restrictions Precautions ?Precautions: Knee;Fall ?Precaution Booklet Issued: Yes (comment) ?Precaution Comments: reviewed no pillow under knee ?Restrictions ?Weight Bearing Restrictions: No ?LLE Weight Bearing: Weight bearing as tolerated  ?  ? ?Mobility ? Bed Mobility ?Overal bed mobility: Modified Independent ?Bed Mobility: Supine to Sit ?  ?  ?Supine to sit: Modified independent (Device/Increase time), HOB elevated ?  ?  ?General bed mobility comments: HOB up, used rail ?   ? ?Transfers ?Overall transfer level: Needs assistance ?Equipment used: Rolling walker (2 wheels) ?Transfers: Sit to/from Stand ?Sit to Stand: Supervision ?  ?  ?  ?  ?  ?General transfer comment: VCs hand placement ?  ? ?Ambulation/Gait ?Ambulation/Gait assistance: Supervision ?Gait Distance (Feet): 160 Feet ?Assistive device: Rolling walker (2 wheels) ?Gait Pattern/deviations: Step-to pattern, Decreased step length - right, Decreased step length - left ?Gait velocity: decr ?  ?  ?General Gait Details: steady, VCs sequencing, no loss of balance, VCs for proximity to RW ? ? ?Stairs ?Stairs: Yes ?Stairs assistance: Min guard ?Stair Management: One rail Left, Forwards, With cane ?Number of Stairs: 5 ?General stair comments: VCs sequencing, daughter present ? ? ?Wheelchair Mobility ?  ? ?Modified Rankin (Stroke Patients Only) ?  ? ? ?  ?Balance Overall balance assessment: Modified Independent ?  ?  ?  ?  ?  ?  ?  ?  ?  ?  ?  ?  ?  ?  ?  ?  ?  ?  ?  ? ?  ?Cognition Arousal/Alertness: Awake/alert ?Behavior During Therapy: [facility] for tasks assessed/performed ?Overall Cognitive Status: Within Functional Limits for tasks assessed ?  ?  ?  ?  ?  ?  ?  ?  ?  ?  ?  ?  ?  ?  ?  ?  ?  ?  ?  ? ?  ?Exercises Total Joint Exercises ?Ankle Circles/Pumps: AROM, Both, 10 reps, Supine ?Quad Sets: AROM, Left, 10 reps, Supine ?Short Arc Quad: AROM, Left, 10 reps, Supine ?Heel Slides: AAROM, Left, Supine, 10 reps ?Hip ABduction/ADduction: AAROM, Left, 10 reps, Supine ?Straight Leg Raises: AROM, Left, 10 reps, Supine ?Long  Arc Quad: AROM, Left, Seated, 10 reps ?Knee Flexion: AAROM, Left, 10 reps, Seated ?Goniometric ROM: 5-80* AAROM L knee ? ?  ?General Comments   ?  ?  ? ?Pertinent Vitals/Pain Pain Assessment ?Pain Score: 2  ?Pain Location: L knee ?Pain Descriptors / Indicators: Sore ?Pain Intervention(s): Limited activity within patient's tolerance, Monitored during session, Premedicated before session, Ice applied  ? ? ?Home Living   ?  ?   ?  ?  ?  ?  ?  ?  ?  ?   ?  ?Prior Function    ?  ?  ?   ? ?PT Goals (current goals can now be found in the care plan section) Acute Rehab PT Goals ?Patient Stated Goal: golf, attend grand daughter's graduation in a month ?PT Goal Formulation: With patient/family ?Time For Goal Achievement: 05/22/21 ?Potential to Achieve Goals: Good ?Progress towards PT goals: Progressing toward goals ? ?  ?Frequency ? ? ? 7X/week ? ? ? ?  ?PT Plan Current plan remains appropriate  ? ? ?Co-evaluation   ?  ?  ?  ?  ? ?  ?AM-PAC PT "6 Clicks" Mobility   ?Outcome Measure ? Help needed turning from your back to your side while in a flat bed without using bedrails?: A Little ?Help needed moving from lying on your back to sitting on the side of a flat bed without using bedrails?: A Little ?Help needed moving to and from a bed to a chair (including a wheelchair)?: None ?Help needed standing up from a chair using your arms (e.g., wheelchair or bedside chair)?: None ?Help needed to walk in hospital room?: None ?Help needed climbing 3-5 steps with a railing? : A Little ?6 Click Score: 21 ? ?  ?End of Session Equipment Utilized During Treatment: Gait belt ?Activity Tolerance: Patient tolerated treatment well ?Patient left: in chair;with call bell/phone within reach;with family/visitor present ?Nurse Communication: Mobility status ?PT Visit Diagnosis: Difficulty in walking, not elsewhere classified (R26.2);Pain ?Pain - Right/Left: Left ?Pain - part of body: Knee ?  ? ? ?Time: 5361-4431 ?PT Time Calculation (min) (ACUTE ONLY): 52 min ? ?Charges:  $Gait Training: 23-37 mins ?$Therapeutic Exercise: 8-22 mins          ?          ? ?Philomena Doheny PT 05/16/2021  ?Acute Rehabilitation Services ?Pager [phone] ?Office [phone] ? ? ?

## 2021-05-16 NOTE — TOC Transition Note (Signed)
Transition of Care (TOC) - CM/SW Discharge Note ? ?Patient Details  ?Name: Mark West. ?MRN: [id number] ?Date of Birth: [birth year] ? ?Transition of Care (TOC) CM/SW Contact:  ?Sherie Don, LCSW ?Phone Number: ?05/16/2021, 11:38 AM ? ?Clinical Narrative: Patient is expected to discharge home after working with PT. CSW met with patient and his daughter to review discharge plan and needs. Patient will go home with OPPT at [facility]. Patient has a rolling walker and rollator at home, but will need a cane. CSW made DME referral to [place] with [employer]. [employer] to deliver cane to patient's room. TOC signing off. ? ?Final next level of care: OP Rehab ?Barriers to Discharge: No Barriers Identified ? ?Patient Goals and CMS Choice ?Patient states their goals for this hospitalization and ongoing recovery are:: Discharge home with OPPT at [facility] ?CMS Medicare.gov Compare Post Acute Care list provided to:: Patient ?Choice offered to / list presented to : Patient ? ?Discharge Plan and Services       ?DME Arranged: Kasandra Knudsen ?DME Agency: [employer] ?Date DME Agency Contacted: 05/16/21 ?Time DME Agency Contacted: [phone] ?Representative spoke with at DME Agency: Andee Poles ? ?Readmission Risk Interventions ?   ? View : No data to display.  ?  ?  ?  ? ?

## 2021-05-22 DIAGNOSIS — M25561 Pain in right knee: Secondary | ICD-10-CM | POA: Diagnosis not present

## 2021-05-24 NOTE — Discharge Summary (Signed)
Patient ID: Mark West. MRN: [id number] DOB/AGE: April 25, 1940 81 y.o.  Admit date: 05/15/2021 Discharge date: 05/16/2021  Admission Diagnoses:  Left knee osteoarthritis  Discharge Diagnoses:  Principal Problem:   S/P total knee arthroplasty, left   Past Medical History:  Diagnosis Date   Anemia, iron deficiency 01/26/2011   Arthritis    Asthma    Cancer (HCC)    nhl, colon ca   Carotid artery occlusion    right occlusion of carotid with moderate left plaque   Coronary artery disease 08/08/2003   MI with stent   Diabetes mellitus    GERD (gastroesophageal reflux disease)    History of kidney stones    Hyperlipidemia    Hypertension    Kidney stones 05/08/2010   lithotripsy   Myocardial infarction [facility])     Surgeries: Procedure(s): TOTAL KNEE ARTHROPLASTY on 05/15/2021   Consultants:   Discharged Condition: Improved  Hospital Course: Mehul Rudin. is an 81 y.o. male who was admitted 05/15/2021 for operative treatment ofS/P total knee arthroplasty, left. Patient has severe unremitting pain that affects sleep, daily activities, and work/hobbies. After pre-op clearance the patient was taken to the operating room on 05/15/2021 and underwent  Procedure(s): TOTAL KNEE ARTHROPLASTY.    Patient was given perioperative antibiotics:  Anti-infectives (From admission, onward)    Start     Dose/Rate Route Frequency Ordered Stop   05/15/21 1600  ceFAZolin (ANCEF) IVPB 2g/100 mL premix        2 g 200 mL/hr over 30 Minutes Intravenous Every 6 hours 05/15/21 1157 05/15/21 2235   05/15/21 0730  ceFAZolin (ANCEF) IVPB 2g/100 mL premix        2 g 200 mL/hr over 30 Minutes Intravenous On call to O.R. 05/15/21 0720 05/15/21 3299        Patient was given sequential compression devices, early ambulation, and chemoprophylaxis to prevent DVT. Patient worked with PT and was meeting their goals regarding safe ambulation and transfers.  Patient benefited maximally from hospital  stay and there were no complications.    Recent vital signs: No data found.   Recent laboratory studies: No results for input(s): WBC, HGB, HCT, PLT, NA, K, CL, CO2, BUN, CREATININE, GLUCOSE, INR, CALCIUM in the last 72 hours.  Invalid input(s): PT, 2   Discharge Medications:   Allergies as of 05/16/2021       Reactions   Iodine Other (See Comments)    EYE SWELLING WITH CONTRAST MEDIA   Iodinated Contrast Media Other (See Comments)   Lisinopril Other (See Comments)        Medication List     TAKE these medications    acetaminophen 500 MG tablet Commonly known as: TYLENOL Take 2 tablets (1,000 mg total) by mouth every 6 (six) hours.   aspirin 81 MG tablet Take 81 mg by mouth daily.   atorvastatin 40 MG tablet Commonly known as: LIPITOR Take 40 mg by mouth daily.   B-12 PO Place 1,000 mcg under the tongue daily.   clopidogrel 75 MG tablet Commonly known as: PLAVIX Take 75 mg by mouth daily.   CO Q 10 PO Take 1 capsule by mouth daily.   docusate sodium 100 MG capsule Commonly known as: COLACE Take 1 capsule (100 mg total) by mouth 2 (two) times daily.   dorzolamide-timolol 22.3-6.8 MG/ML ophthalmic solution Commonly known as: COSOPT Place 1 drop into both eyes 2 (two) times daily.   latanoprost 0.005 % ophthalmic solution Commonly known as: Ivin Poot  Place 1 drop into both eyes at bedtime.   losartan 100 MG tablet Commonly known as: COZAAR Take 100 mg by mouth daily.   Lutein 20 MG Tabs Take 20 mg by mouth daily.   metFORMIN 500 MG tablet Commonly known as: GLUCOPHAGE Take 1,000 mg by mouth 2 (two) times daily with a meal.   methocarbamol 500 MG tablet Commonly known as: ROBAXIN Take 1 tablet (500 mg total) by mouth every 6 (six) hours as needed for muscle spasms.   metoprolol tartrate 50 MG tablet Commonly known as: LOPRESSOR Take 25 mg by mouth 2 (two) times daily.   nitroGLYCERIN 0.4 MG SL tablet Commonly known as: NITROSTAT Place 0.4 mg  under the tongue every 5 (five) minutes x 3 doses as needed for chest pain.   oxyCODONE 5 MG immediate release tablet Commonly known as: Oxy IR/ROXICODONE Take 1-2 tablets (5-10 mg total) by mouth every 4 (four) hours as needed for moderate pain or severe pain. Start with 1 tablet every 4 hours. Only take 2 tablets for severe pain.   pantoprazole 40 MG tablet Commonly known as: PROTONIX Take 40 mg by mouth daily.   polyethylene glycol 17 g packet Commonly known as: MIRALAX / GLYCOLAX Take 17 g by mouth daily as needed for mild constipation.   prednisoLONE acetate 1 % ophthalmic suspension Commonly known as: PRED FORTE Place 1 drop into the right eye daily.   sitaGLIPtin 100 MG tablet Commonly known as: JANUVIA Take 100 mg by mouth daily.   VITAMIN D PO Take 1 tablet by mouth daily.   ZINC PO Take 1 tablet by mouth daily.               Discharge Care Instructions  (From admission, onward)           Start     Ordered   05/16/21 0000  Change dressing       Comments: Maintain surgical dressing until follow up in the clinic. If the edges start to pull up, may reinforce with tape. If the dressing is no longer working, may remove and cover with gauze and tape, but must keep the area dry and clean.  Call with any questions or concerns.   05/16/21 0728            Diagnostic Studies: No results found.  Disposition: Discharge disposition: 01-Home or Self Care       Discharge Instructions     Call MD / Call 911   Complete by: As directed    If you experience chest pain or shortness of breath, CALL 911 and be transported to the hospital emergency room.  If you develope a fever above 101 F, pus (white drainage) or increased drainage or redness at the wound, or calf pain, call your surgeon's office.   Change dressing   Complete by: As directed    Maintain surgical dressing until follow up in the clinic. If the edges start to pull up, may reinforce with tape. If  the dressing is no longer working, may remove and cover with gauze and tape, but must keep the area dry and clean.  Call with any questions or concerns.   Constipation Prevention   Complete by: As directed    Drink plenty of fluids.  Prune juice may be helpful.  You may use a stool softener, such as Colace (over the counter) 100 mg twice a day.  Use MiraLax (over the counter) for constipation as needed.   Diet -  low sodium heart healthy   Complete by: As directed    Increase activity slowly as tolerated   Complete by: As directed    Weight bearing as tolerated with assist device (walker, cane, etc) as directed, use it as long as suggested by your surgeon or therapist, typically at least 4-6 weeks.   Post-operative opioid taper instructions:   Complete by: As directed    POST-OPERATIVE OPIOID TAPER INSTRUCTIONS: It is important to wean off of your opioid medication as soon as possible. If you do not need pain medication after your surgery it is ok to stop day one. Opioids include: Codeine, Hydrocodone(Norco, Vicodin), Oxycodone(Percocet, oxycontin) and hydromorphone amongst others.  Long term and even short term use of opiods can cause: Increased pain response Dependence Constipation Depression Respiratory depression And more.  Withdrawal symptoms can include Flu like symptoms Nausea, vomiting And more Techniques to manage these symptoms Hydrate well Eat regular healthy meals Stay active Use relaxation techniques(deep breathing, meditating, yoga) Do Not substitute Alcohol to help with tapering If you have been on opioids for less than two weeks and do not have pain than it is ok to stop all together.  Plan to wean off of opioids This plan should start within one week post op of your joint replacement. Maintain the same interval or time between taking each dose and first decrease the dose.  Cut the total daily intake of opioids by one tablet each day Next start to increase the time  between doses. The last dose that should be eliminated is the evening dose.      TED hose   Complete by: As directed    Use stockings (TED hose) for 2 weeks on both leg(s).  You may remove them at night for sleeping.        Follow-up Information     Paralee Cancel, MD. Go on 05/30/2021.   Specialty: Orthopedic Surgery Why: You are scheduled for first post op appointment on Wednesday May 24th at 3:30pm. Contact information: [address] [place] [address] [phone]         Rosilyn Mings.. Go on 05/18/2021.   Why: You are scheduled for physical therapy evaluation on Friday May 12th at 1:45pm. Contact information: [place] [address] [phone]                  Signed: Irving Copas 05/24/2021, 7:10 AM

## 2021-05-25 DIAGNOSIS — M25561 Pain in right knee: Secondary | ICD-10-CM | POA: Diagnosis not present

## 2021-05-29 DIAGNOSIS — K219 Gastro-esophageal reflux disease without esophagitis: Secondary | ICD-10-CM | POA: Diagnosis not present

## 2021-05-29 DIAGNOSIS — I2510 Atherosclerotic heart disease of native coronary artery without angina pectoris: Secondary | ICD-10-CM | POA: Diagnosis not present

## 2021-05-29 DIAGNOSIS — E782 Mixed hyperlipidemia: Secondary | ICD-10-CM | POA: Diagnosis not present

## 2021-05-29 DIAGNOSIS — N1831 Chronic kidney disease, stage 3a: Secondary | ICD-10-CM | POA: Diagnosis not present

## 2021-05-29 DIAGNOSIS — I10 Essential (primary) hypertension: Secondary | ICD-10-CM | POA: Diagnosis not present

## 2021-05-29 DIAGNOSIS — M1990 Unspecified osteoarthritis, unspecified site: Secondary | ICD-10-CM | POA: Diagnosis not present

## 2021-05-29 DIAGNOSIS — E1142 Type 2 diabetes mellitus with diabetic polyneuropathy: Secondary | ICD-10-CM | POA: Diagnosis not present

## 2021-05-29 DIAGNOSIS — M25561 Pain in right knee: Secondary | ICD-10-CM | POA: Diagnosis not present

## 2021-06-01 DIAGNOSIS — M25562 Pain in left knee: Secondary | ICD-10-CM | POA: Diagnosis not present

## 2021-06-06 DIAGNOSIS — M25562 Pain in left knee: Secondary | ICD-10-CM | POA: Diagnosis not present

## 2021-06-08 DIAGNOSIS — M25562 Pain in left knee: Secondary | ICD-10-CM | POA: Diagnosis not present

## 2021-06-12 DIAGNOSIS — M25562 Pain in left knee: Secondary | ICD-10-CM | POA: Diagnosis not present

## 2021-06-15 DIAGNOSIS — M25562 Pain in left knee: Secondary | ICD-10-CM | POA: Diagnosis not present

## 2021-06-18 DIAGNOSIS — M25562 Pain in left knee: Secondary | ICD-10-CM | POA: Diagnosis not present

## 2021-06-19 ENCOUNTER — Other Ambulatory Visit: Payer: Self-pay | Admitting: Internal Medicine

## 2021-06-19 ENCOUNTER — Ambulatory Visit
Admission: RE | Admit: 2021-06-19 | Discharge: 2021-06-19 | Disposition: A | Discharge status: Home / Self Care | Payer: PPO | Source: Ambulatory Visit | Attending: Internal Medicine | Admitting: Internal Medicine

## 2021-06-19 DIAGNOSIS — R634 Abnormal weight loss: Secondary | ICD-10-CM

## 2021-06-19 DIAGNOSIS — R630 Anorexia: Secondary | ICD-10-CM | POA: Diagnosis not present

## 2021-06-19 DIAGNOSIS — N1831 Chronic kidney disease, stage 3a: Secondary | ICD-10-CM | POA: Diagnosis not present

## 2021-06-19 DIAGNOSIS — R1110 Vomiting, unspecified: Secondary | ICD-10-CM | POA: Diagnosis not present

## 2021-06-19 DIAGNOSIS — Z85038 Personal history of other malignant neoplasm of large intestine: Secondary | ICD-10-CM | POA: Diagnosis not present

## 2021-06-20 ENCOUNTER — Other Ambulatory Visit: Payer: Self-pay | Admitting: Internal Medicine

## 2021-06-20 DIAGNOSIS — R634 Abnormal weight loss: Secondary | ICD-10-CM

## 2021-06-21 DIAGNOSIS — M25561 Pain in right knee: Secondary | ICD-10-CM | POA: Diagnosis not present

## 2021-06-26 DIAGNOSIS — M25561 Pain in right knee: Secondary | ICD-10-CM | POA: Diagnosis not present

## 2021-06-28 DIAGNOSIS — M25561 Pain in right knee: Secondary | ICD-10-CM | POA: Diagnosis not present

## 2021-07-03 DIAGNOSIS — M25561 Pain in right knee: Secondary | ICD-10-CM | POA: Diagnosis not present

## 2021-07-05 DIAGNOSIS — Z471 Aftercare following joint replacement surgery: Secondary | ICD-10-CM | POA: Diagnosis not present

## 2021-07-05 DIAGNOSIS — Z96652 Presence of left artificial knee joint: Secondary | ICD-10-CM | POA: Diagnosis not present

## 2021-07-05 DIAGNOSIS — M25561 Pain in right knee: Secondary | ICD-10-CM | POA: Diagnosis not present

## 2021-07-16 DIAGNOSIS — J439 Emphysema, unspecified: Secondary | ICD-10-CM | POA: Diagnosis not present

## 2021-07-16 DIAGNOSIS — Z23 Encounter for immunization: Secondary | ICD-10-CM | POA: Diagnosis not present

## 2021-07-16 DIAGNOSIS — E782 Mixed hyperlipidemia: Secondary | ICD-10-CM | POA: Diagnosis not present

## 2021-07-16 DIAGNOSIS — N1831 Chronic kidney disease, stage 3a: Secondary | ICD-10-CM | POA: Diagnosis not present

## 2021-07-16 DIAGNOSIS — E1142 Type 2 diabetes mellitus with diabetic polyneuropathy: Secondary | ICD-10-CM | POA: Diagnosis not present

## 2021-07-16 DIAGNOSIS — I739 Peripheral vascular disease, unspecified: Secondary | ICD-10-CM | POA: Diagnosis not present

## 2021-07-16 DIAGNOSIS — I2510 Atherosclerotic heart disease of native coronary artery without angina pectoris: Secondary | ICD-10-CM | POA: Diagnosis not present

## 2021-07-16 DIAGNOSIS — I700 Atherosclerosis of aorta: Secondary | ICD-10-CM | POA: Diagnosis not present

## 2021-07-16 DIAGNOSIS — Z85038 Personal history of other malignant neoplasm of large intestine: Secondary | ICD-10-CM | POA: Diagnosis not present

## 2021-07-16 DIAGNOSIS — K219 Gastro-esophageal reflux disease without esophagitis: Secondary | ICD-10-CM | POA: Diagnosis not present

## 2021-07-26 DIAGNOSIS — H401131 Primary open-angle glaucoma, bilateral, mild stage: Secondary | ICD-10-CM | POA: Diagnosis not present

## 2021-08-14 DIAGNOSIS — H2521 Age-related cataract, morgagnian type, right eye: Secondary | ICD-10-CM | POA: Diagnosis not present

## 2021-08-14 DIAGNOSIS — E113292 Type 2 diabetes mellitus with mild nonproliferative diabetic retinopathy without macular edema, left eye: Secondary | ICD-10-CM | POA: Diagnosis not present

## 2021-08-14 DIAGNOSIS — H211X1 Other vascular disorders of iris and ciliary body, right eye: Secondary | ICD-10-CM | POA: Diagnosis not present

## 2021-08-14 DIAGNOSIS — H353124 Nonexudative age-related macular degeneration, left eye, advanced atrophic with subfoveal involvement: Secondary | ICD-10-CM | POA: Diagnosis not present

## 2021-11-26 DIAGNOSIS — H401121 Primary open-angle glaucoma, left eye, mild stage: Secondary | ICD-10-CM | POA: Diagnosis not present

## 2021-12-11 DIAGNOSIS — D485 Neoplasm of uncertain behavior of skin: Secondary | ICD-10-CM | POA: Diagnosis not present

## 2021-12-11 DIAGNOSIS — L720 Epidermal cyst: Secondary | ICD-10-CM | POA: Diagnosis not present

## 2021-12-11 DIAGNOSIS — L821 Other seborrheic keratosis: Secondary | ICD-10-CM | POA: Diagnosis not present

## 2021-12-11 DIAGNOSIS — L565 Disseminated superficial actinic porokeratosis (DSAP): Secondary | ICD-10-CM | POA: Diagnosis not present

## 2021-12-11 DIAGNOSIS — L723 Sebaceous cyst: Secondary | ICD-10-CM | POA: Diagnosis not present

## 2021-12-11 DIAGNOSIS — D3617 Benign neoplasm of peripheral nerves and autonomic nervous system of trunk, unspecified: Secondary | ICD-10-CM | POA: Diagnosis not present

## 2021-12-11 DIAGNOSIS — D235 Other benign neoplasm of skin of trunk: Secondary | ICD-10-CM | POA: Diagnosis not present

## 2021-12-24 DIAGNOSIS — H04123 Dry eye syndrome of bilateral lacrimal glands: Secondary | ICD-10-CM | POA: Diagnosis not present

## 2022-01-22 DIAGNOSIS — Z1331 Encounter for screening for depression: Secondary | ICD-10-CM | POA: Diagnosis not present

## 2022-01-22 DIAGNOSIS — Z85038 Personal history of other malignant neoplasm of large intestine: Secondary | ICD-10-CM | POA: Diagnosis not present

## 2022-01-22 DIAGNOSIS — I739 Peripheral vascular disease, unspecified: Secondary | ICD-10-CM | POA: Diagnosis not present

## 2022-01-22 DIAGNOSIS — J439 Emphysema, unspecified: Secondary | ICD-10-CM | POA: Diagnosis not present

## 2022-01-22 DIAGNOSIS — I2510 Atherosclerotic heart disease of native coronary artery without angina pectoris: Secondary | ICD-10-CM | POA: Diagnosis not present

## 2022-01-22 DIAGNOSIS — Z0000 Encounter for general adult medical examination without abnormal findings: Secondary | ICD-10-CM | POA: Diagnosis not present

## 2022-01-22 DIAGNOSIS — I10 Essential (primary) hypertension: Secondary | ICD-10-CM | POA: Diagnosis not present

## 2022-01-22 DIAGNOSIS — E782 Mixed hyperlipidemia: Secondary | ICD-10-CM | POA: Diagnosis not present

## 2022-01-22 DIAGNOSIS — E1142 Type 2 diabetes mellitus with diabetic polyneuropathy: Secondary | ICD-10-CM | POA: Diagnosis not present

## 2022-01-22 DIAGNOSIS — H5440 Blindness, one eye, unspecified eye: Secondary | ICD-10-CM | POA: Diagnosis not present

## 2022-01-22 DIAGNOSIS — I700 Atherosclerosis of aorta: Secondary | ICD-10-CM | POA: Diagnosis not present

## 2022-01-22 DIAGNOSIS — N1831 Chronic kidney disease, stage 3a: Secondary | ICD-10-CM | POA: Diagnosis not present

## 2022-01-22 DIAGNOSIS — N400 Enlarged prostate without lower urinary tract symptoms: Secondary | ICD-10-CM | POA: Diagnosis not present

## 2022-01-23 DIAGNOSIS — C44719 Basal cell carcinoma of skin of left lower limb, including hip: Secondary | ICD-10-CM | POA: Diagnosis not present

## 2022-01-23 DIAGNOSIS — L720 Epidermal cyst: Secondary | ICD-10-CM | POA: Diagnosis not present

## 2022-01-23 DIAGNOSIS — L821 Other seborrheic keratosis: Secondary | ICD-10-CM | POA: Diagnosis not present

## 2022-01-23 DIAGNOSIS — D485 Neoplasm of uncertain behavior of skin: Secondary | ICD-10-CM | POA: Diagnosis not present

## 2022-02-18 ENCOUNTER — Inpatient Hospital Stay: Payer: PPO

## 2022-02-18 ENCOUNTER — Inpatient Hospital Stay: Payer: PPO | Admitting: Hematology

## 2022-02-26 DIAGNOSIS — E113292 Type 2 diabetes mellitus with mild nonproliferative diabetic retinopathy without macular edema, left eye: Secondary | ICD-10-CM | POA: Diagnosis not present

## 2022-02-26 DIAGNOSIS — H2521 Age-related cataract, morgagnian type, right eye: Secondary | ICD-10-CM | POA: Diagnosis not present

## 2022-02-26 DIAGNOSIS — H211X1 Other vascular disorders of iris and ciliary body, right eye: Secondary | ICD-10-CM | POA: Diagnosis not present

## 2022-02-26 DIAGNOSIS — H353124 Nonexudative age-related macular degeneration, left eye, advanced atrophic with subfoveal involvement: Secondary | ICD-10-CM | POA: Diagnosis not present

## 2022-03-07 ENCOUNTER — Other Ambulatory Visit: Payer: Self-pay

## 2022-03-07 DIAGNOSIS — D509 Iron deficiency anemia, unspecified: Secondary | ICD-10-CM

## 2022-03-07 DIAGNOSIS — C8203 Follicular lymphoma grade I, intra-abdominal lymph nodes: Secondary | ICD-10-CM

## 2022-03-07 NOTE — Progress Notes (Signed)
HEMATOLOGY ONCOLOGY PROGRESS NOTE  Date of service: 03/08/2022  Patient Care Team: Wenda Low, MD as PCP - General (Internal Medicine) Belva Crome, MD (Inactive) as PCP - Cardiology (Cardiology)  CC: Follow-up for history of colon cancer and follicle lymphoma  INTERVAL HISTORY:  Mark West  is here for continued evaluation and management of follicular lymphoma and a remote history of colon cancer.  Patient was last seen by me on 03/14/2021 and reported limitations associated with his arthritis, but was otherwise doing well overall.   Today, he reports that he has been doing well overall. He reports that he has had knee surgery in May 2023 and is looking forward to play golf soon.  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 He reports that he is having this treated. He does have some mild bilateral LE edema which patient says he has had for a while.  He denies any unexpected weight loss, but did report losing 25 pounds after taking Oxycodone which caused nausea and vomiting symptoms. He denies any fever, abdominal pain, chills, night sweats, or blood in urine or stool. He will repeat colonoscopy next year.   He reports that he has had some skin lesions removed left LE which were likely basal cell cancer. This was the first time he had had this. He will FU soon to continue to monitor this.   REVIEW OF SYSTEMS:    10 Point review of Systems was done is negative except as noted above.  . Past Medical History:  Diagnosis Date   Anemia, iron deficiency 01/26/2011   Arthritis    Asthma    Cancer (HCC)    nhl, colon ca   Carotid artery occlusion    right occlusion of carotid with moderate left plaque   Coronary artery disease 08/08/2003   MI with stent   Diabetes mellitus    GERD (gastroesophageal reflux disease)    History of kidney stones    Hyperlipidemia    Hypertension    Kidney stones 05/08/2010    lithotripsy   Myocardial infarction ([place])     . Past Surgical History:  Procedure Laterality Date   CARPAL TUNNEL RELEASE Left 05/08/2015   Procedure: CARPAL TUNNEL RELEASE;  Surgeon: Charlotte Crumb, MD;  Location: [facility];  Service: Orthopedics;  Laterality: Left;   COLON SURGERY     COLONOSCOPY     CORONARY STENT PLACEMENT     EYE SURGERY     HERNIA REPAIR     OPEN REDUCTION INTERNAL FIXATION (ORIF) DISTAL RADIAL FRACTURE Left 05/08/2015   Procedure: OPEN REDUCTION INTERNAL FIXATION (ORIF) DISTAL RADIAL FRACTURE, CARPAL TUNNEL RELEASE;  Surgeon: Charlotte Crumb, MD;  Location: [facility];  Service: Orthopedics;  Laterality: Left;   REPAIR OF COMPLEX TRACTION RETINAL DETACHMENT     TOTAL KNEE ARTHROPLASTY Left 05/15/2021   Procedure: TOTAL KNEE ARTHROPLASTY;  Surgeon: Paralee Cancel, MD;  Location: [facility];  Service: Orthopedics;  Laterality: Left;    . Social History   Tobacco Use   Smoking status: Former    Packs/day: 1.00    Years: 20.00    Total pack years: 20.00    Types: Cigarettes    Quit date: 01/08/1980    Years since quitting: 42.1   Smokeless tobacco: Never  Vaping Use   Vaping Use: Never used  Substance Use Topics   Alcohol use: No    Alcohol/week: 0.0 standard drinks of alcohol  Drug use: No    ALLERGIES:  is allergic to iodine, iodinated contrast media, and lisinopril.  MEDICATIONS:  Current Outpatient Medications  Medication Sig Dispense Refill   acetaminophen (TYLENOL) 500 MG tablet Take 2 tablets (1,000 mg total) by mouth every 6 (six) hours. 30 tablet 0   aspirin 81 MG tablet Take 81 mg by mouth daily.     atorvastatin (LIPITOR) 40 MG tablet Take 40 mg by mouth daily.     clopidogrel (PLAVIX) 75 MG tablet Take 75 mg by mouth daily.     Coenzyme Q10 (CO Q 10 PO) Take 1 capsule by mouth daily.      Cyanocobalamin (B-12 PO) Place 1,000 mcg under the tongue daily.     docusate sodium (COLACE) 100 MG capsule Take 1 capsule (100 mg total) by mouth 2 (two) times  daily. 10 capsule 0   dorzolamide-timolol (COSOPT) 22.3-6.8 MG/ML ophthalmic solution Place 1 drop into both eyes 2 (two) times daily.     latanoprost (XALATAN) 0.005 % ophthalmic solution Place 1 drop into both eyes at bedtime.     losartan (COZAAR) 100 MG tablet Take 100 mg by mouth daily.     Lutein 20 MG TABS Take 20 mg by mouth daily.     metFORMIN (GLUCOPHAGE) 500 MG tablet Take 1,000 mg by mouth 2 (two) times daily with a meal.     methocarbamol (ROBAXIN) 500 MG tablet Take 1 tablet (500 mg total) by mouth every 6 (six) hours as needed for muscle spasms. 40 tablet 0   metoprolol (LOPRESSOR) 50 MG tablet Take 25 mg by mouth 2 (two) times daily.     Multiple Vitamins-Minerals (ZINC PO) Take 1 tablet by mouth daily.     nitroGLYCERIN (NITROSTAT) 0.4 MG SL tablet Place 0.4 mg under the tongue every 5 (five) minutes x 3 doses as needed for chest pain.     oxyCODONE (OXY IR/ROXICODONE) 5 MG immediate release tablet Take 1-2 tablets (5-10 mg total) by mouth every 4 (four) hours as needed for moderate pain or severe pain. Start with 1 tablet every 4 hours. Only take 2 tablets for severe pain. 42 tablet 0   pantoprazole (PROTONIX) 40 MG tablet Take 40 mg by mouth daily.      polyethylene glycol (MIRALAX / GLYCOLAX) 17 g packet Take 17 g by mouth daily as needed for mild constipation. 14 each 0   prednisoLONE acetate (PRED FORTE) 1 % ophthalmic suspension Place 1 drop into the right eye daily.     sitaGLIPtin (JANUVIA) 100 MG tablet Take 100 mg by mouth daily.     VITAMIN D PO Take 1 tablet by mouth daily.     No current facility-administered medications for this visit.    PHYSICAL EXAMINATION: ECOG PERFORMANCE STATUS: 2 - Symptomatic, <50% confined to bed  Vitals:   03/08/22 1020  BP: (!) 158/75  Pulse: (!) 58  Resp: 18  Temp: 97.9 F (36.6 C)  SpO2: 100%     Filed Weights   03/08/22 1020  Weight: 231 lb 6.4 oz (105 kg)    .Body mass index is 34.17 kg/m.    GENERAL:alert, in  no acute distress and comfortable SKIN: no acute rashes, no significant lesions EYES: conjunctiva are pink and non-injected, sclera anicteric OROPHARYNX: MMM, no exudates, no oropharyngeal erythema or ulceration NECK: supple, no JVD LYMPH:  no palpable lymphadenopathy in the cervical, axillary or inguinal regions LUNGS: clear to auscultation b/l with normal respiratory effort HEART: regular rate &  rhythm ABDOMEN:  normoactive bowel sounds , non tender, not distended. Extremity: no pedal edema PSYCH: alert & oriented x 3 with fluent speech NEURO: no focal motor/sensory deficits   LABORATORY DATA:   I have reviewed the data as listed  .    Latest Ref Rng & Units 03/08/2022   10:02 AM 05/16/2021    3:37 AM 05/02/2021   11:33 AM  CBC  WBC 4.0 - 10.5 K/uL 5.6  9.9  5.5   Hemoglobin 13.0 - 17.0 g/dL 12.3  10.7  11.7   Hematocrit 39.0 - 52.0 % 37.9  33.9  37.8   Platelets 150 - 400 K/uL 180  157  165     CBC    Component Value Date/Time   WBC 5.6 03/08/2022 1002   WBC 9.9 05/16/2021 0337   RBC 4.11 (L) 03/08/2022 1002   HGB 12.3 (L) 03/08/2022 1002   HGB 10.9 (L) 12/26/2016 1400   HCT 37.9 (L) 03/08/2022 1002   HCT 35.0 (L) 12/26/2016 1400   PLT 180 03/08/2022 1002   PLT 196 12/26/2016 1400   MCV 92.2 03/08/2022 1002   MCV 91.1 12/26/2016 1400   MCH 29.9 03/08/2022 1002   MCHC 32.5 03/08/2022 1002   RDW 14.3 03/08/2022 1002   RDW 14.6 12/26/2016 1400   LYMPHSABS 1.0 03/08/2022 1002   LYMPHSABS 1.5 12/26/2016 1400   MONOABS 0.5 03/08/2022 1002   MONOABS 0.4 12/26/2016 1400   EOSABS 0.1 03/08/2022 1002   EOSABS 0.1 12/26/2016 1400   BASOSABS 0.0 03/08/2022 1002   BASOSABS 0.0 12/26/2016 1400    .    Latest Ref Rng & Units 03/08/2022   10:02 AM 05/16/2021    3:37 AM 05/02/2021   11:33 AM  CMP  Glucose 70 - 99 mg/dL 164  185  128   BUN 8 - 23 mg/dL 34  43  23   Creatinine 0.61 - 1.24 mg/dL 1.72  1.85  1.42   Sodium 135 - 145 mmol/L 138  138  140   Potassium 3.5 -  5.1 mmol/L 5.0  5.1  4.7   Chloride 98 - 111 mmol/L 110  112  112   CO2 22 - 32 mmol/L '22  19  22   '$ Calcium 8.9 - 10.3 mg/dL 9.0  8.6  9.1   Total Protein 6.5 - 8.1 g/dL 6.6     Total Bilirubin 0.3 - 1.2 mg/dL 0.7     Alkaline Phos 38 - 126 U/L 52     AST 15 - 41 U/L 26     ALT 0 - 44 U/L 21      . Lab Results  Component Value Date   IRON 73 03/08/2022   TIBC 353 03/08/2022   IRONPCTSAT 21 03/08/2022   (Iron and TIBC)  Lab Results  Component Value Date   FERRITIN 18 (L) 03/08/2022      RADIOGRAPHIC STUDIES: I have personally reviewed the radiological images as listed and agreed with the findings in the report.    ASSESSMENT & PLAN:   1.Follicular NHL Patient has never required treatment for this.  no obvious progression by CT [facility] 11-2015 -CT abd 11/27/2016 findings were likely related to his E Coli colonic infection and has clinically resolved with levofloxacin rx from our clinic. . Lab Results  Component Value Date   LDH 130 04/12/2019   2. Iron deficiency anemia previously related to GI bleeding from nonmalignant inflammatory anastomotic colon polyp.  Iron deficiency persistent.  S/p IV injectafer x 2 injan-feb 2019.  Lab Results  Component Value Date   FERRITIN 18 (L) 03/08/2022   3.remote history of synchronous colon cancers.   02/02/18 CT C/A/P which revealed Status post right hemicolectomy. No findings suspicious for recurrent tumor. 2. Stable scattered bilateral pulmonary nodules but no new or progressive findings. 3. No findings for lymphadenopathy involving the chest, abdomen or pelvis. 4. Stable atherosclerotic calcifications involving the thoracic and abdominal aorta and branch vessels.  CEA 2.72 on 08/26/2018   PLAN:  -Discussed lab results on 03/08/22 in detail with patient. CBC normal, revealed WBC of 5.6K, hemoglobin improved to 12.3, and platelets of 180K CMP with stable CKD CEA 3.35 Ferritin 18 with iron saturation of  21% -reasonable to take ferrous sulfate 1 cap po daily -Patient will repeat colonoscopy next year, 2025 -CT abd in June 2023 revealed kidney stones but no signs of tumors at this time -Patient shall FU with me in 1 year - no lab or clinical evidence of colon cancer or follicular lymphoma recurrence/progression at this time.  FOLLOW-UP: RTC with Dr Irene Limbo with labs in 12 months  The total time spent in the appointment was 20 minutes* .  All of the patient's questions were answered with apparent satisfaction. The patient knows to call the clinic with any problems, questions or concerns.   Sullivan Lone MD MS AAHIVMS [facility] Hematology/Oncology Physician [facility]  .*Total Encounter Time as defined by the Centers for Medicare and Medicaid Services includes, in addition to the face-to-face time of a patient visit (documented in the note above) non-face-to-face time: obtaining and reviewing outside history, ordering and reviewing medications, tests or procedures, care coordination (communications with other health care professionals or caregivers) and documentation in the medical record.   I,Mitra Faeizi,acting as a Education administrator for Sullivan Lone, MD.,have documented all relevant documentation on the behalf of Sullivan Lone, MD,as directed by  Sullivan Lone, MD while in the presence of Sullivan Lone, MD.  .I have reviewed the above documentation for accuracy and completeness, and I agree with the above. Brunetta Genera MD

## 2022-03-08 ENCOUNTER — Inpatient Hospital Stay: Payer: PPO | Attending: Hematology

## 2022-03-08 ENCOUNTER — Other Ambulatory Visit: Payer: Self-pay

## 2022-03-08 ENCOUNTER — Inpatient Hospital Stay (HOSPITAL_BASED_OUTPATIENT_CLINIC_OR_DEPARTMENT_OTHER): Payer: PPO | Admitting: Hematology

## 2022-03-08 VITALS — BP 158/75 | HR 58 | Temp 97.9°F | Resp 18 | Wt 231.4 lb

## 2022-03-08 DIAGNOSIS — C8203 Follicular lymphoma grade I, intra-abdominal lymph nodes: Secondary | ICD-10-CM | POA: Diagnosis not present

## 2022-03-08 DIAGNOSIS — Z85038 Personal history of other malignant neoplasm of large intestine: Secondary | ICD-10-CM

## 2022-03-08 DIAGNOSIS — H5461 Unqualified visual loss, right eye, normal vision left eye: Secondary | ICD-10-CM | POA: Insufficient documentation

## 2022-03-08 DIAGNOSIS — D509 Iron deficiency anemia, unspecified: Secondary | ICD-10-CM | POA: Diagnosis not present

## 2022-03-08 DIAGNOSIS — N189 Chronic kidney disease, unspecified: Secondary | ICD-10-CM | POA: Insufficient documentation

## 2022-03-08 DIAGNOSIS — M1990 Unspecified osteoarthritis, unspecified site: Secondary | ICD-10-CM | POA: Diagnosis not present

## 2022-03-08 DIAGNOSIS — Z87891 Personal history of nicotine dependence: Secondary | ICD-10-CM | POA: Insufficient documentation

## 2022-03-08 DIAGNOSIS — R600 Localized edema: Secondary | ICD-10-CM | POA: Insufficient documentation

## 2022-03-08 DIAGNOSIS — C8290 Follicular lymphoma, unspecified, unspecified site: Secondary | ICD-10-CM | POA: Insufficient documentation

## 2022-03-08 LAB — CMP (CANCER CENTER ONLY)
ALT: 21 U/L (ref 0–44)
AST: 26 U/L (ref 15–41)
Albumin: 3.6 g/dL (ref 3.5–5.0)
Alkaline Phosphatase: 52 U/L (ref 38–126)
Anion gap: 6 (ref 5–15)
BUN: 34 mg/dL — ABNORMAL HIGH (ref 8–23)
CO2: 22 mmol/L (ref 22–32)
Calcium: 9 mg/dL (ref 8.9–10.3)
Chloride: 110 mmol/L (ref 98–111)
Creatinine: 1.72 mg/dL — ABNORMAL HIGH (ref 0.61–1.24)
GFR, Estimated: 40 mL/min — ABNORMAL LOW (ref >60)
Glucose, Bld: 164 mg/dL — ABNORMAL HIGH (ref 70–99)
Potassium: 5 mmol/L (ref 3.5–5.1)
Sodium: 138 mmol/L (ref 135–145)
Total Bilirubin: 0.7 mg/dL (ref 0.3–1.2)
Total Protein: 6.6 g/dL (ref 6.5–8.1)

## 2022-03-08 LAB — CBC WITH DIFFERENTIAL (CANCER CENTER ONLY)
Abs Immature Granulocytes: 0.03 10*3/uL (ref 0.00–0.07)
Basophils Absolute: 0 10*3/uL (ref 0.0–0.1)
Basophils Relative: 1 %
Eosinophils Absolute: 0.1 10*3/uL (ref 0.0–0.5)
Eosinophils Relative: 1 %
HCT: 37.9 % — ABNORMAL LOW (ref 39.0–52.0)
Hemoglobin: 12.3 g/dL — ABNORMAL LOW (ref 13.0–17.0)
Immature Granulocytes: 1 %
Lymphocytes Relative: 18 %
Lymphs Abs: 1 10*3/uL (ref 0.7–4.0)
MCH: 29.9 pg (ref 26.0–34.0)
MCHC: 32.5 g/dL (ref 30.0–36.0)
MCV: 92.2 fL (ref 80.0–100.0)
Monocytes Absolute: 0.5 10*3/uL (ref 0.1–1.0)
Monocytes Relative: 8 %
Neutro Abs: 4.1 10*3/uL (ref 1.7–7.7)
Neutrophils Relative %: 71 %
Platelet Count: 180 10*3/uL (ref 150–400)
RBC: 4.11 MIL/uL — ABNORMAL LOW (ref 4.22–5.81)
RDW: 14.3 % (ref 11.5–15.5)
WBC Count: 5.6 10*3/uL (ref 4.0–10.5)
nRBC: 0 % (ref 0.0–0.2)

## 2022-03-08 LAB — IRON AND IRON BINDING CAPACITY (CC-WL,HP ONLY)
Iron: 73 ug/dL (ref 45–182)
Saturation Ratios: 21 % (ref 17.9–39.5)
TIBC: 353 ug/dL (ref 250–450)
UIBC: 280 ug/dL (ref 117–376)

## 2022-03-08 LAB — CEA (ACCESS): CEA (CHCC): 3.35 ng/mL (ref 0.00–5.00)

## 2022-03-08 LAB — FERRITIN: Ferritin: 18 ng/mL — ABNORMAL LOW (ref 24–336)

## 2022-03-14 ENCOUNTER — Encounter: Payer: Self-pay | Admitting: Hematology

## 2022-03-26 DIAGNOSIS — H4051X1 Glaucoma secondary to other eye disorders, right eye, mild stage: Secondary | ICD-10-CM | POA: Diagnosis not present

## 2022-05-09 DIAGNOSIS — J069 Acute upper respiratory infection, unspecified: Secondary | ICD-10-CM | POA: Diagnosis not present

## 2022-05-10 DIAGNOSIS — Z96652 Presence of left artificial knee joint: Secondary | ICD-10-CM | POA: Diagnosis not present

## 2022-05-28 DIAGNOSIS — H211X1 Other vascular disorders of iris and ciliary body, right eye: Secondary | ICD-10-CM | POA: Diagnosis not present

## 2022-05-28 DIAGNOSIS — H2521 Age-related cataract, morgagnian type, right eye: Secondary | ICD-10-CM | POA: Diagnosis not present

## 2022-05-28 DIAGNOSIS — E113292 Type 2 diabetes mellitus with mild nonproliferative diabetic retinopathy without macular edema, left eye: Secondary | ICD-10-CM | POA: Diagnosis not present

## 2022-05-28 DIAGNOSIS — H35042 Retinal micro-aneurysms, unspecified, left eye: Secondary | ICD-10-CM | POA: Diagnosis not present

## 2022-05-28 DIAGNOSIS — H353124 Nonexudative age-related macular degeneration, left eye, advanced atrophic with subfoveal involvement: Secondary | ICD-10-CM | POA: Diagnosis not present

## 2022-07-15 DIAGNOSIS — I700 Atherosclerosis of aorta: Secondary | ICD-10-CM | POA: Diagnosis not present

## 2022-07-15 DIAGNOSIS — K5140 Inflammatory polyps of colon without complications: Secondary | ICD-10-CM | POA: Diagnosis not present

## 2022-07-15 DIAGNOSIS — I5030 Unspecified diastolic (congestive) heart failure: Secondary | ICD-10-CM | POA: Diagnosis not present

## 2022-07-15 DIAGNOSIS — I252 Old myocardial infarction: Secondary | ICD-10-CM | POA: Diagnosis not present

## 2022-07-15 DIAGNOSIS — Z85038 Personal history of other malignant neoplasm of large intestine: Secondary | ICD-10-CM | POA: Diagnosis not present

## 2022-07-15 DIAGNOSIS — E1151 Type 2 diabetes mellitus with diabetic peripheral angiopathy without gangrene: Secondary | ICD-10-CM | POA: Diagnosis not present

## 2022-07-15 DIAGNOSIS — N1831 Chronic kidney disease, stage 3a: Secondary | ICD-10-CM | POA: Diagnosis not present

## 2022-07-15 DIAGNOSIS — J439 Emphysema, unspecified: Secondary | ICD-10-CM | POA: Diagnosis not present

## 2022-07-15 DIAGNOSIS — I130 Hypertensive heart and chronic kidney disease with heart failure and stage 1 through stage 4 chronic kidney disease, or unspecified chronic kidney disease: Secondary | ICD-10-CM | POA: Diagnosis not present

## 2022-07-15 DIAGNOSIS — C8203 Follicular lymphoma grade I, intra-abdominal lymph nodes: Secondary | ICD-10-CM | POA: Diagnosis not present

## 2022-07-15 DIAGNOSIS — E1142 Type 2 diabetes mellitus with diabetic polyneuropathy: Secondary | ICD-10-CM | POA: Diagnosis not present

## 2022-07-15 DIAGNOSIS — I2510 Atherosclerotic heart disease of native coronary artery without angina pectoris: Secondary | ICD-10-CM | POA: Diagnosis not present

## 2022-07-30 DIAGNOSIS — I10 Essential (primary) hypertension: Secondary | ICD-10-CM | POA: Diagnosis not present

## 2022-08-05 DIAGNOSIS — H4051X3 Glaucoma secondary to other eye disorders, right eye, severe stage: Secondary | ICD-10-CM | POA: Diagnosis not present

## 2022-08-21 DIAGNOSIS — U071 COVID-19: Secondary | ICD-10-CM | POA: Diagnosis not present

## 2022-08-21 DIAGNOSIS — J310 Chronic rhinitis: Secondary | ICD-10-CM | POA: Diagnosis not present

## 2022-08-21 DIAGNOSIS — R059 Cough, unspecified: Secondary | ICD-10-CM | POA: Diagnosis not present

## 2022-09-03 DIAGNOSIS — H5202 Hypermetropia, left eye: Secondary | ICD-10-CM | POA: Diagnosis not present

## 2022-09-03 DIAGNOSIS — Z961 Presence of intraocular lens: Secondary | ICD-10-CM | POA: Diagnosis not present

## 2022-09-03 DIAGNOSIS — H4051X3 Glaucoma secondary to other eye disorders, right eye, severe stage: Secondary | ICD-10-CM | POA: Diagnosis not present

## 2022-10-11 DIAGNOSIS — H211X1 Other vascular disorders of iris and ciliary body, right eye: Secondary | ICD-10-CM | POA: Diagnosis not present

## 2022-10-11 DIAGNOSIS — H353124 Nonexudative age-related macular degeneration, left eye, advanced atrophic with subfoveal involvement: Secondary | ICD-10-CM | POA: Diagnosis not present

## 2022-10-11 DIAGNOSIS — H2521 Age-related cataract, morgagnian type, right eye: Secondary | ICD-10-CM | POA: Diagnosis not present

## 2022-10-11 DIAGNOSIS — E113292 Type 2 diabetes mellitus with mild nonproliferative diabetic retinopathy without macular edema, left eye: Secondary | ICD-10-CM | POA: Diagnosis not present

## 2022-10-21 DIAGNOSIS — E782 Mixed hyperlipidemia: Secondary | ICD-10-CM | POA: Diagnosis not present

## 2022-10-21 DIAGNOSIS — N1831 Chronic kidney disease, stage 3a: Secondary | ICD-10-CM | POA: Diagnosis not present

## 2022-10-21 DIAGNOSIS — I10 Essential (primary) hypertension: Secondary | ICD-10-CM | POA: Diagnosis not present

## 2022-10-21 DIAGNOSIS — Z6835 Body mass index (BMI) 35.0-35.9, adult: Secondary | ICD-10-CM | POA: Diagnosis not present

## 2022-10-21 DIAGNOSIS — I700 Atherosclerosis of aorta: Secondary | ICD-10-CM | POA: Diagnosis not present

## 2022-10-21 DIAGNOSIS — E1122 Type 2 diabetes mellitus with diabetic chronic kidney disease: Secondary | ICD-10-CM | POA: Diagnosis not present

## 2022-10-21 DIAGNOSIS — I2510 Atherosclerotic heart disease of native coronary artery without angina pectoris: Secondary | ICD-10-CM | POA: Diagnosis not present

## 2022-10-21 DIAGNOSIS — E1151 Type 2 diabetes mellitus with diabetic peripheral angiopathy without gangrene: Secondary | ICD-10-CM | POA: Diagnosis not present

## 2022-10-21 DIAGNOSIS — J439 Emphysema, unspecified: Secondary | ICD-10-CM | POA: Diagnosis not present

## 2022-10-21 DIAGNOSIS — E1142 Type 2 diabetes mellitus with diabetic polyneuropathy: Secondary | ICD-10-CM | POA: Diagnosis not present

## 2022-10-21 DIAGNOSIS — H5440 Blindness, one eye, unspecified eye: Secondary | ICD-10-CM | POA: Diagnosis not present

## 2023-03-10 ENCOUNTER — Other Ambulatory Visit: Payer: Self-pay

## 2023-03-10 DIAGNOSIS — C8203 Follicular lymphoma grade I, intra-abdominal lymph nodes: Secondary | ICD-10-CM

## 2023-03-11 ENCOUNTER — Inpatient Hospital Stay: Payer: PPO | Admitting: Hematology

## 2023-03-11 ENCOUNTER — Inpatient Hospital Stay: Payer: PPO | Attending: Hematology

## 2023-03-11 VITALS — BP 190/63 | HR 61 | Temp 97.8°F | Resp 17 | Wt 235.2 lb

## 2023-03-11 DIAGNOSIS — D509 Iron deficiency anemia, unspecified: Secondary | ICD-10-CM | POA: Diagnosis not present

## 2023-03-11 DIAGNOSIS — C8203 Follicular lymphoma grade I, intra-abdominal lymph nodes: Secondary | ICD-10-CM

## 2023-03-11 DIAGNOSIS — Z85038 Personal history of other malignant neoplasm of large intestine: Secondary | ICD-10-CM

## 2023-03-11 DIAGNOSIS — Z862 Personal history of diseases of the blood and blood-forming organs and certain disorders involving the immune mechanism: Secondary | ICD-10-CM | POA: Insufficient documentation

## 2023-03-11 DIAGNOSIS — Z8572 Personal history of non-Hodgkin lymphomas: Secondary | ICD-10-CM | POA: Diagnosis not present

## 2023-03-11 DIAGNOSIS — Z87891 Personal history of nicotine dependence: Secondary | ICD-10-CM | POA: Insufficient documentation

## 2023-03-11 DIAGNOSIS — I10 Essential (primary) hypertension: Secondary | ICD-10-CM | POA: Diagnosis not present

## 2023-03-11 LAB — CBC WITH DIFFERENTIAL (CANCER CENTER ONLY)
Abs Immature Granulocytes: 0.02 10*3/uL (ref 0.00–0.07)
Basophils Absolute: 0.1 10*3/uL (ref 0.0–0.1)
Basophils Relative: 1 %
Eosinophils Absolute: 0 10*3/uL (ref 0.0–0.5)
Eosinophils Relative: 1 %
HCT: 33.1 % — ABNORMAL LOW (ref 39.0–52.0)
Hemoglobin: 10.5 g/dL — ABNORMAL LOW (ref 13.0–17.0)
Immature Granulocytes: 0 %
Lymphocytes Relative: 19 %
Lymphs Abs: 1.2 10*3/uL (ref 0.7–4.0)
MCH: 28.7 pg (ref 26.0–34.0)
MCHC: 31.7 g/dL (ref 30.0–36.0)
MCV: 90.4 fL (ref 80.0–100.0)
Monocytes Absolute: 0.5 10*3/uL (ref 0.1–1.0)
Monocytes Relative: 8 %
Neutro Abs: 4.5 10*3/uL (ref 1.7–7.7)
Neutrophils Relative %: 71 %
Platelet Count: 174 10*3/uL (ref 150–400)
RBC: 3.66 MIL/uL — ABNORMAL LOW (ref 4.22–5.81)
RDW: 15.2 % (ref 11.5–15.5)
WBC Count: 6.3 10*3/uL (ref 4.0–10.5)
nRBC: 0 % (ref 0.0–0.2)

## 2023-03-11 LAB — CMP (CANCER CENTER ONLY)
ALT: 13 U/L (ref 0–44)
AST: 28 U/L (ref 15–41)
Albumin: 4 g/dL (ref 3.5–5.0)
Alkaline Phosphatase: 53 U/L (ref 38–126)
Anion gap: 5 (ref 5–15)
BUN: 26 mg/dL — ABNORMAL HIGH (ref 8–23)
CO2: 24 mmol/L (ref 22–32)
Calcium: 9.1 mg/dL (ref 8.9–10.3)
Chloride: 112 mmol/L — ABNORMAL HIGH (ref 98–111)
Creatinine: 1.7 mg/dL — ABNORMAL HIGH (ref 0.61–1.24)
GFR, Estimated: 40 mL/min — ABNORMAL LOW (ref >60)
Glucose, Bld: 156 mg/dL — ABNORMAL HIGH (ref 70–99)
Potassium: 5.1 mmol/L (ref 3.5–5.1)
Sodium: 141 mmol/L (ref 135–145)
Total Bilirubin: 0.6 mg/dL (ref 0.0–1.2)
Total Protein: 6.4 g/dL — ABNORMAL LOW (ref 6.5–8.1)

## 2023-03-11 LAB — IRON AND IRON BINDING CAPACITY (CC-WL,HP ONLY)
Iron: 80 ug/dL (ref 45–182)
Saturation Ratios: 23 % (ref 17.9–39.5)
TIBC: 350 ug/dL (ref 250–450)
UIBC: 270 ug/dL (ref 117–376)

## 2023-03-11 LAB — CEA (ACCESS): CEA (CHCC): 2.99 ng/mL (ref 0.00–5.00)

## 2023-03-11 LAB — FERRITIN: Ferritin: 13 ng/mL — ABNORMAL LOW (ref 24–336)

## 2023-03-11 NOTE — Progress Notes (Signed)
 HEMATOLOGY ONCOLOGY PROGRESS NOTE  Date of service: 03/11/23   Patient Care Team: Georgann Housekeeper, MD as PCP - General (Internal Medicine) Lyn Records, MD (Inactive) as PCP - Cardiology (Cardiology)  CC: Follow-up for history of colon cancer and follicle lymphoma  INTERVAL HISTORY:  Mr Mark West  is here for continued evaluation and management of follicular lymphoma and a remote history of colon cancer.  Patient was last seen by me on 03/08/2022 and he complained of being blind in his right eye, night blurriness in his left eyes, and mild bilateral LE edema.  Patient notes he has been doing well overall since our last visit. He denies any new infection issues, fever, chills, night sweats, unexpected weight loss, abnormal bleeding, back pain, chest pain, abdominal pain. He does complain of bilateral leg swelling and tingling sensation on his right hand.   He notes he has been diagnosed with hypertension and he has been following up with his PCP.  Patient denies taking iron supplement. He had IV Iron infusion in the past.  He has discontinued Plavix. Patient notes that his PCP has changed his Plavix to another medication, but he does not recall the medication name.    REVIEW OF SYSTEMS:    10 Point review of Systems was done is negative except as noted above.  . Past Medical History:  Diagnosis Date   Anemia, iron deficiency 01/26/2011   Arthritis    Asthma    Cancer (HCC)    nhl, colon ca   Carotid artery occlusion    right occlusion of carotid with moderate left plaque   Coronary artery disease 08/08/2003   MI with stent   Diabetes mellitus    GERD (gastroesophageal reflux disease)    History of kidney stones    Hyperlipidemia    Hypertension    Kidney stones 05/08/2010   lithotripsy   Myocardial infarction (HCC)     . Past Surgical History:  Procedure Laterality Date   CARPAL TUNNEL RELEASE Left 05/08/2015   Procedure: CARPAL TUNNEL  RELEASE;  Surgeon: Dairl Ponder, MD;  Location: [facility];  Service: Orthopedics;  Laterality: Left;   COLON SURGERY     COLONOSCOPY     CORONARY STENT PLACEMENT     EYE SURGERY     HERNIA REPAIR     OPEN REDUCTION INTERNAL FIXATION (ORIF) DISTAL RADIAL FRACTURE Left 05/08/2015   Procedure: OPEN REDUCTION INTERNAL FIXATION (ORIF) DISTAL RADIAL FRACTURE, CARPAL TUNNEL RELEASE;  Surgeon: Dairl Ponder, MD;  Location: [facility];  Service: Orthopedics;  Laterality: Left;   REPAIR OF COMPLEX TRACTION RETINAL DETACHMENT     TOTAL KNEE ARTHROPLASTY Left 05/15/2021   Procedure: TOTAL KNEE ARTHROPLASTY;  Surgeon: Durene Romans, MD;  Location: [facility];  Service: Orthopedics;  Laterality: Left;    . Social History   Tobacco Use   Smoking status: Former    Current packs/day: 0.00    Average packs/day: 1 pack/day for 20.0 years (20.0 ttl pk-yrs)    Types: Cigarettes    Start date: 01/08/1960    Quit date: 01/08/1980    Years since quitting: 43.2   Smokeless tobacco: Never  Vaping Use   Vaping status: Never Used  Substance Use Topics   Alcohol use: No    Alcohol/week: 0.0 standard drinks of alcohol   Drug use: No    ALLERGIES:  is allergic to iodine, iodinated contrast media, and lisinopril.  MEDICATIONS:  Current Outpatient Medications  Medication Sig Dispense Refill  acetaminophen (TYLENOL) 500 MG tablet Take 2 tablets (1,000 mg total) by mouth every 6 (six) hours. 30 tablet 0   aspirin 81 MG tablet Take 81 mg by mouth daily.     atorvastatin (LIPITOR) 40 MG tablet Take 40 mg by mouth daily.     clopidogrel (PLAVIX) 75 MG tablet Take 75 mg by mouth daily.     Coenzyme Q10 (CO Q 10 PO) Take 1 capsule by mouth daily.      Cyanocobalamin (B-12 PO) Place 1,000 mcg under the tongue daily.     docusate sodium (COLACE) 100 MG capsule Take 1 capsule (100 mg total) by mouth 2 (two) times daily. 10 capsule 0   dorzolamide-timolol (COSOPT) 22.3-6.8 MG/ML ophthalmic solution Place 1 drop into both eyes  2 (two) times daily.     latanoprost (XALATAN) 0.005 % ophthalmic solution Place 1 drop into both eyes at bedtime.     losartan (COZAAR) 100 MG tablet Take 100 mg by mouth daily.     Lutein 20 MG TABS Take 20 mg by mouth daily.     metFORMIN (GLUCOPHAGE) 500 MG tablet Take 1,000 mg by mouth 2 (two) times daily with a meal.     methocarbamol (ROBAXIN) 500 MG tablet Take 1 tablet (500 mg total) by mouth every 6 (six) hours as needed for muscle spasms. 40 tablet 0   metoprolol (LOPRESSOR) 50 MG tablet Take 25 mg by mouth 2 (two) times daily.     Multiple Vitamins-Minerals (ZINC PO) Take 1 tablet by mouth daily.     nitroGLYCERIN (NITROSTAT) 0.4 MG SL tablet Place 0.4 mg under the tongue every 5 (five) minutes x 3 doses as needed for chest pain.     oxyCODONE (OXY IR/ROXICODONE) 5 MG immediate release tablet Take 1-2 tablets (5-10 mg total) by mouth every 4 (four) hours as needed for moderate pain or severe pain. Start with 1 tablet every 4 hours. Only take 2 tablets for severe pain. 42 tablet 0   pantoprazole (PROTONIX) 40 MG tablet Take 40 mg by mouth daily.      polyethylene glycol (MIRALAX / GLYCOLAX) 17 g packet Take 17 g by mouth daily as needed for mild constipation. 14 each 0   prednisoLONE acetate (PRED FORTE) 1 % ophthalmic suspension Place 1 drop into the right eye daily.     sitaGLIPtin (JANUVIA) 100 MG tablet Take 100 mg by mouth daily.     VITAMIN D PO Take 1 tablet by mouth daily.     No current facility-administered medications for this visit.    PHYSICAL EXAMINATION: ECOG PERFORMANCE STATUS: 2 - Symptomatic, <50% confined to bed  Vitals:   03/11/23 0934  BP: (!) 190/63  Pulse: 61  Resp: 17  Temp: 97.8 F (36.6 C)  SpO2: 99%   Filed Weights   03/11/23 0934  Weight: 235 lb 3.2 oz (106.7 kg)  .Body mass index is 34.73 kg/m.   GENERAL:alert, in no acute distress and comfortable SKIN: no acute rashes, no significant lesions EYES: conjunctiva are pink and non-injected,  sclera anicteric OROPHARYNX: MMM, no exudates, no oropharyngeal erythema or ulceration NECK: supple, no JVD LYMPH:  no palpable lymphadenopathy in the cervical, axillary or inguinal regions LUNGS: clear to auscultation b/l with normal respiratory effort HEART: regular rate & rhythm ABDOMEN:  normoactive bowel sounds , non tender, not distended. Extremity: no pedal edema PSYCH: alert & oriented x 3 with fluent speech NEURO: no focal motor/sensory deficits   LABORATORY DATA:   I  have reviewed the data as listed  .    Latest Ref Rng & Units 03/11/2023    8:52 AM 03/08/2022   10:02 AM 05/16/2021    3:37 AM  CBC  WBC 4.0 - 10.5 K/uL 6.3  5.6  9.9   Hemoglobin 13.0 - 17.0 g/dL 16.1  09.6  04.5   Hematocrit 39.0 - 52.0 % 33.1  37.9  33.9   Platelets 150 - 400 K/uL 174  180  157     CBC    Component Value Date/Time   WBC 6.3 03/11/2023 0852   WBC 9.9 05/16/2021 0337   RBC 3.66 (L) 03/11/2023 0852   HGB 10.5 (L) 03/11/2023 0852   HGB 10.9 (L) 12/26/2016 1400   HCT 33.1 (L) 03/11/2023 0852   HCT 35.0 (L) 12/26/2016 1400   PLT 174 03/11/2023 0852   PLT 196 12/26/2016 1400   MCV 90.4 03/11/2023 0852   MCV 91.1 12/26/2016 1400   MCH 28.7 03/11/2023 0852   MCHC 31.7 03/11/2023 0852   RDW 15.2 03/11/2023 0852   RDW 14.6 12/26/2016 1400   LYMPHSABS 1.2 03/11/2023 0852   LYMPHSABS 1.5 12/26/2016 1400   MONOABS 0.5 03/11/2023 0852   MONOABS 0.4 12/26/2016 1400   EOSABS 0.0 03/11/2023 0852   EOSABS 0.1 12/26/2016 1400   BASOSABS 0.1 03/11/2023 0852   BASOSABS 0.0 12/26/2016 1400    .    Latest Ref Rng & Units 03/11/2023    8:52 AM 03/08/2022   10:02 AM 05/16/2021    3:37 AM  CMP  Glucose 70 - 99 mg/dL 409  811  914   BUN 8 - 23 mg/dL 26  34  43   Creatinine 0.61 - 1.24 mg/dL 7.82  9.56  2.13   Sodium 135 - 145 mmol/L 141  138  138   Potassium 3.5 - 5.1 mmol/L 5.1  5.0  5.1   Chloride 98 - 111 mmol/L 112  110  112   CO2 22 - 32 mmol/L 24  22  19    Calcium 8.9 - 10.3 mg/dL 9.1   9.0  8.6   Total Protein 6.5 - 8.1 g/dL 6.4  6.6    Total Bilirubin 0.0 - 1.2 mg/dL 0.6  0.7    Alkaline Phos 38 - 126 U/L 53  52    AST 15 - 41 U/L 28  26    ALT 0 - 44 U/L 13  21     . Lab Results  Component Value Date   IRON 80 03/11/2023   TIBC 350 03/11/2023   IRONPCTSAT 23 03/11/2023   (Iron and TIBC)  Lab Results  Component Value Date   FERRITIN 13 (L) 03/11/2023      RADIOGRAPHIC STUDIES: I have personally reviewed the radiological images as listed and agreed with the findings in the report.    ASSESSMENT & PLAN:   1.Follicular NHL Patient has never required treatment for this.  no obvious progression by CT [facility] 11-2015 -CT abd 11/27/2016 findings were likely related to his E Coli colonic infection and has clinically resolved with levofloxacin rx from our clinic. . Lab Results  Component Value Date   LDH 130 04/12/2019   2. Iron deficiency anemia previously related to GI bleeding from nonmalignant inflammatory anastomotic colon polyp.  Iron deficiency persistent. S/p IV injectafer x 2 injan-feb 2019.  Lab Results  Component Value Date   FERRITIN 13 (L) 03/11/2023   3.remote history of synchronous colon cancers.  02/02/18 CT C/A/P which revealed Status post right hemicolectomy. No findings suspicious for recurrent tumor. 2. Stable scattered bilateral pulmonary nodules but no new or progressive findings. 3. No findings for lymphadenopathy involving the chest, abdomen or pelvis. 4. Stable atherosclerotic calcifications involving the thoracic and abdominal aorta and branch vessels.  CEA 2.72 on 08/26/2018   PLAN:  -Discussed lab results from today, 03/11/2023, in detail with the patient. CBC shows patient is anemic with low Hgb of 10.5 g/dL with Hct of 16.1%.  Cmp shows elevated BUN of 26, elevated creatinine of 1.70.  Iron labs show iron deficiency with ferritin of 13 -Discussed with the patient that his anemia might be related to CKD. We  are waiting on iron labs.  -Discussed the option of IV Iron infusion if Iron labs show Iron-deficiency. Pt agrees.  -Answered all of patient's questions.  -Patient has been scheduled for colonoscopy this year.  RTC with Dr Candise Che with labs in 12 months  FOLLOW-UP: RTC with Dr Candise Che with labs in 12 months  The total time spent in the appointment was 30 minutes* .  All of the patient's questions were answered with apparent satisfaction. The patient knows to call the clinic with any problems, questions or concerns.   Wyvonnia Lora MD MS AAHIVMS [facility] Hematology/Oncology Physician [facility]  .*Total Encounter Time as defined by the Centers for Medicare and Medicaid Services includes, in addition to the face-to-face time of a patient visit (documented in the note above) non-face-to-face time: obtaining and reviewing outside history, ordering and reviewing medications, tests or procedures, care coordination (communications with other health care professionals or caregivers) and documentation in the medical record.   I,Param Shah,acting as a Neurosurgeon for Wyvonnia Lora, MD.,have documented all relevant documentation on the behalf of Wyvonnia Lora, MD,as directed by  Wyvonnia Lora, MD while in the presence of Wyvonnia Lora, MD.  .I have reviewed the above documentation for accuracy and completeness, and I agree with the above. Johney Maine MD  ADDENDUM  . Lab Results  Component Value Date   IRON 80 03/11/2023   TIBC 350 03/11/2023   IRONPCTSAT 23 03/11/2023   (Iron and TIBC)  Lab Results  Component Value Date   FERRITIN 13 (L) 03/11/2023   -will offer patient option for IV iron since his ferritin goal with CKD is 250 Recommended to f/u with his gastroenterologist for his colonoscopy

## 2023-03-17 ENCOUNTER — Encounter: Payer: Self-pay | Admitting: Hematology

## 2023-03-17 DIAGNOSIS — D509 Iron deficiency anemia, unspecified: Secondary | ICD-10-CM | POA: Insufficient documentation

## 2023-03-18 ENCOUNTER — Telehealth: Payer: Self-pay | Admitting: Pharmacy Technician

## 2023-03-18 NOTE — Telephone Encounter (Signed)
 Auth Submission: NO AUTH NEEDED Site of care: Site of care: [facility] Payer: [employer] ADVT Medication & CPT/J Code(s) submitted: Feraheme (ferumoxytol) [id number] Route of submission (phone, fax, portal):  Phone # Fax # Auth type: Buy/Bill PB Units/visits requested: 2 DOSES Reference number:  Approval from: 03/18/23 to 08/18/23

## 2023-03-31 DIAGNOSIS — C8203 Follicular lymphoma grade I, intra-abdominal lymph nodes: Secondary | ICD-10-CM | POA: Diagnosis not present

## 2023-03-31 DIAGNOSIS — D649 Anemia, unspecified: Secondary | ICD-10-CM | POA: Diagnosis not present

## 2023-03-31 DIAGNOSIS — I700 Atherosclerosis of aorta: Secondary | ICD-10-CM | POA: Diagnosis not present

## 2023-03-31 DIAGNOSIS — I739 Peripheral vascular disease, unspecified: Secondary | ICD-10-CM | POA: Diagnosis not present

## 2023-03-31 DIAGNOSIS — E1142 Type 2 diabetes mellitus with diabetic polyneuropathy: Secondary | ICD-10-CM | POA: Diagnosis not present

## 2023-03-31 DIAGNOSIS — Z85038 Personal history of other malignant neoplasm of large intestine: Secondary | ICD-10-CM | POA: Diagnosis not present

## 2023-03-31 DIAGNOSIS — J439 Emphysema, unspecified: Secondary | ICD-10-CM | POA: Diagnosis not present

## 2023-03-31 DIAGNOSIS — I10 Essential (primary) hypertension: Secondary | ICD-10-CM | POA: Diagnosis not present

## 2023-03-31 DIAGNOSIS — E782 Mixed hyperlipidemia: Secondary | ICD-10-CM | POA: Diagnosis not present

## 2023-03-31 DIAGNOSIS — N1831 Chronic kidney disease, stage 3a: Secondary | ICD-10-CM | POA: Diagnosis not present

## 2023-03-31 DIAGNOSIS — I2510 Atherosclerotic heart disease of native coronary artery without angina pectoris: Secondary | ICD-10-CM | POA: Diagnosis not present

## 2023-04-01 DIAGNOSIS — I2510 Atherosclerotic heart disease of native coronary artery without angina pectoris: Secondary | ICD-10-CM | POA: Diagnosis not present

## 2023-04-01 DIAGNOSIS — Z860109 Personal history of other colon polyps: Secondary | ICD-10-CM | POA: Diagnosis not present

## 2023-04-01 DIAGNOSIS — Z85038 Personal history of other malignant neoplasm of large intestine: Secondary | ICD-10-CM | POA: Diagnosis not present

## 2023-04-01 DIAGNOSIS — Z800 Family history of malignant neoplasm of digestive organs: Secondary | ICD-10-CM | POA: Diagnosis not present

## 2023-04-04 ENCOUNTER — Ambulatory Visit: Admitting: *Deleted

## 2023-04-04 VITALS — BP 154/67 | HR 56 | Temp 98.2°F | Resp 16 | Ht 68.5 in | Wt 232.6 lb

## 2023-04-04 DIAGNOSIS — D509 Iron deficiency anemia, unspecified: Secondary | ICD-10-CM

## 2023-04-04 MED ORDER — SODIUM CHLORIDE 0.9 % IV SOLN
510.0000 mg | Freq: Once | INTRAVENOUS | Status: AC
  Administered 2023-04-04: 510 mg via INTRAVENOUS
  Filled 2023-04-04: qty 17

## 2023-04-04 MED ORDER — ACETAMINOPHEN 325 MG PO TABS
650.0000 mg | ORAL_TABLET | Freq: Once | ORAL | Status: AC
  Administered 2023-04-04: 650 mg via ORAL
  Filled 2023-04-04: qty 2

## 2023-04-04 MED ORDER — DIPHENHYDRAMINE HCL 25 MG PO CAPS: 25.0000 mg | ORAL_CAPSULE | Freq: Once | ORAL | Status: DC

## 2023-04-04 NOTE — Progress Notes (Signed)
 Diagnosis: Iron Deficiency Anemia  Provider:  Chilton Greathouse MD  Procedure: IV Infusion  IV Type: Peripheral, IV Location: R Forearm  Feraheme (Ferumoxytol), Dose: 510 mg  Infusion Start Time: 1447 pm  Infusion Stop Time: 1505 pm  Post Infusion IV Care: Observation period completed and Peripheral IV Discontinued  Discharge: Condition: Good, Destination: Home . AVS Provided  Performed by:  Forrest Moron, RN

## 2023-04-11 ENCOUNTER — Ambulatory Visit

## 2023-04-11 VITALS — BP 150/73 | HR 58 | Temp 98.2°F | Resp 18 | Ht 69.0 in | Wt 232.4 lb

## 2023-04-11 DIAGNOSIS — D509 Iron deficiency anemia, unspecified: Secondary | ICD-10-CM | POA: Diagnosis not present

## 2023-04-11 MED ORDER — SODIUM CHLORIDE 0.9 % IV SOLN
510.0000 mg | Freq: Once | INTRAVENOUS | Status: AC
  Administered 2023-04-11: 510 mg via INTRAVENOUS
  Filled 2023-04-11: qty 17

## 2023-04-11 MED ORDER — ACETAMINOPHEN 325 MG PO TABS
650.0000 mg | ORAL_TABLET | Freq: Once | ORAL | Status: AC
  Administered 2023-04-11: 650 mg via ORAL
  Filled 2023-04-11: qty 2

## 2023-04-11 MED ORDER — DIPHENHYDRAMINE HCL 25 MG PO CAPS: 25.0000 mg | ORAL_CAPSULE | Freq: Once | ORAL | Status: DC

## 2023-04-11 NOTE — Progress Notes (Signed)
 Diagnosis: Iron Deficiency Anemia  Provider:  Chilton Greathouse MD  Procedure: IV Infusion  IV Type: Peripheral, IV Location: R Forearm  Feraheme (Ferumoxytol), Dose: 510 mg  Infusion Start Time: 1049  Infusion Stop Time: 1107  Post Infusion IV Care: Observation period completed and Peripheral IV Discontinued  Discharge: Condition: Good, Destination: Home . AVS Provided  Performed by:  Loney Hering, LPN

## 2023-04-15 DIAGNOSIS — H2521 Age-related cataract, morgagnian type, right eye: Secondary | ICD-10-CM | POA: Diagnosis not present

## 2023-04-15 DIAGNOSIS — H353124 Nonexudative age-related macular degeneration, left eye, advanced atrophic with subfoveal involvement: Secondary | ICD-10-CM | POA: Diagnosis not present

## 2023-04-15 DIAGNOSIS — H211X1 Other vascular disorders of iris and ciliary body, right eye: Secondary | ICD-10-CM | POA: Diagnosis not present

## 2023-04-23 DIAGNOSIS — Z85038 Personal history of other malignant neoplasm of large intestine: Secondary | ICD-10-CM | POA: Diagnosis not present

## 2023-04-23 DIAGNOSIS — Z08 Encounter for follow-up examination after completed treatment for malignant neoplasm: Secondary | ICD-10-CM | POA: Diagnosis not present

## 2023-04-23 DIAGNOSIS — Z980 Intestinal bypass and anastomosis status: Secondary | ICD-10-CM | POA: Diagnosis not present

## 2023-04-23 DIAGNOSIS — Z800 Family history of malignant neoplasm of digestive organs: Secondary | ICD-10-CM | POA: Diagnosis not present

## 2023-05-26 DIAGNOSIS — I10 Essential (primary) hypertension: Secondary | ICD-10-CM | POA: Diagnosis not present

## 2023-05-26 DIAGNOSIS — E1142 Type 2 diabetes mellitus with diabetic polyneuropathy: Secondary | ICD-10-CM | POA: Diagnosis not present

## 2023-05-26 DIAGNOSIS — M542 Cervicalgia: Secondary | ICD-10-CM | POA: Diagnosis not present

## 2023-05-26 DIAGNOSIS — N1831 Chronic kidney disease, stage 3a: Secondary | ICD-10-CM | POA: Diagnosis not present

## 2023-06-03 DIAGNOSIS — H2521 Age-related cataract, morgagnian type, right eye: Secondary | ICD-10-CM | POA: Diagnosis not present

## 2023-06-03 DIAGNOSIS — H401121 Primary open-angle glaucoma, left eye, mild stage: Secondary | ICD-10-CM | POA: Diagnosis not present

## 2023-06-03 DIAGNOSIS — H4051X3 Glaucoma secondary to other eye disorders, right eye, severe stage: Secondary | ICD-10-CM | POA: Diagnosis not present

## 2023-07-04 DIAGNOSIS — M79601 Pain in right arm: Secondary | ICD-10-CM | POA: Diagnosis not present

## 2023-07-04 DIAGNOSIS — M4802 Spinal stenosis, cervical region: Secondary | ICD-10-CM | POA: Diagnosis not present

## 2023-07-04 DIAGNOSIS — M47812 Spondylosis without myelopathy or radiculopathy, cervical region: Secondary | ICD-10-CM | POA: Diagnosis not present

## 2023-07-04 DIAGNOSIS — M47811 Spondylosis without myelopathy or radiculopathy, occipito-atlanto-axial region: Secondary | ICD-10-CM | POA: Diagnosis not present

## 2023-07-04 DIAGNOSIS — M5030 Other cervical disc degeneration, unspecified cervical region: Secondary | ICD-10-CM | POA: Diagnosis not present

## 2023-07-17 DIAGNOSIS — S29012A Strain of muscle and tendon of back wall of thorax, initial encounter: Secondary | ICD-10-CM | POA: Diagnosis not present

## 2023-07-17 DIAGNOSIS — M9901 Segmental and somatic dysfunction of cervical region: Secondary | ICD-10-CM | POA: Diagnosis not present

## 2023-07-17 DIAGNOSIS — M5032 Other cervical disc degeneration, mid-cervical region, unspecified level: Secondary | ICD-10-CM | POA: Diagnosis not present

## 2023-07-17 DIAGNOSIS — M9902 Segmental and somatic dysfunction of thoracic region: Secondary | ICD-10-CM | POA: Diagnosis not present

## 2023-07-28 DIAGNOSIS — M9902 Segmental and somatic dysfunction of thoracic region: Secondary | ICD-10-CM | POA: Diagnosis not present

## 2023-07-28 DIAGNOSIS — M5032 Other cervical disc degeneration, mid-cervical region, unspecified level: Secondary | ICD-10-CM | POA: Diagnosis not present

## 2023-07-28 DIAGNOSIS — M9901 Segmental and somatic dysfunction of cervical region: Secondary | ICD-10-CM | POA: Diagnosis not present

## 2023-07-28 DIAGNOSIS — S29012A Strain of muscle and tendon of back wall of thorax, initial encounter: Secondary | ICD-10-CM | POA: Diagnosis not present

## 2023-07-31 DIAGNOSIS — M9901 Segmental and somatic dysfunction of cervical region: Secondary | ICD-10-CM | POA: Diagnosis not present

## 2023-07-31 DIAGNOSIS — S29012A Strain of muscle and tendon of back wall of thorax, initial encounter: Secondary | ICD-10-CM | POA: Diagnosis not present

## 2023-07-31 DIAGNOSIS — M9902 Segmental and somatic dysfunction of thoracic region: Secondary | ICD-10-CM | POA: Diagnosis not present

## 2023-07-31 DIAGNOSIS — M5032 Other cervical disc degeneration, mid-cervical region, unspecified level: Secondary | ICD-10-CM | POA: Diagnosis not present

## 2023-08-04 DIAGNOSIS — M5032 Other cervical disc degeneration, mid-cervical region, unspecified level: Secondary | ICD-10-CM | POA: Diagnosis not present

## 2023-08-04 DIAGNOSIS — S29012A Strain of muscle and tendon of back wall of thorax, initial encounter: Secondary | ICD-10-CM | POA: Diagnosis not present

## 2023-08-04 DIAGNOSIS — M9902 Segmental and somatic dysfunction of thoracic region: Secondary | ICD-10-CM | POA: Diagnosis not present

## 2023-08-04 DIAGNOSIS — M9901 Segmental and somatic dysfunction of cervical region: Secondary | ICD-10-CM | POA: Diagnosis not present

## 2023-08-07 DIAGNOSIS — M9901 Segmental and somatic dysfunction of cervical region: Secondary | ICD-10-CM | POA: Diagnosis not present

## 2023-08-07 DIAGNOSIS — M9902 Segmental and somatic dysfunction of thoracic region: Secondary | ICD-10-CM | POA: Diagnosis not present

## 2023-08-07 DIAGNOSIS — M5032 Other cervical disc degeneration, mid-cervical region, unspecified level: Secondary | ICD-10-CM | POA: Diagnosis not present

## 2023-08-07 DIAGNOSIS — S29012A Strain of muscle and tendon of back wall of thorax, initial encounter: Secondary | ICD-10-CM | POA: Diagnosis not present

## 2023-08-11 DIAGNOSIS — M5032 Other cervical disc degeneration, mid-cervical region, unspecified level: Secondary | ICD-10-CM | POA: Diagnosis not present

## 2023-08-11 DIAGNOSIS — M9902 Segmental and somatic dysfunction of thoracic region: Secondary | ICD-10-CM | POA: Diagnosis not present

## 2023-08-11 DIAGNOSIS — S29012A Strain of muscle and tendon of back wall of thorax, initial encounter: Secondary | ICD-10-CM | POA: Diagnosis not present

## 2023-08-11 DIAGNOSIS — M9901 Segmental and somatic dysfunction of cervical region: Secondary | ICD-10-CM | POA: Diagnosis not present

## 2023-08-14 DIAGNOSIS — M5032 Other cervical disc degeneration, mid-cervical region, unspecified level: Secondary | ICD-10-CM | POA: Diagnosis not present

## 2023-08-14 DIAGNOSIS — M9901 Segmental and somatic dysfunction of cervical region: Secondary | ICD-10-CM | POA: Diagnosis not present

## 2023-08-14 DIAGNOSIS — M9902 Segmental and somatic dysfunction of thoracic region: Secondary | ICD-10-CM | POA: Diagnosis not present

## 2023-08-14 DIAGNOSIS — M531 Cervicobrachial syndrome: Secondary | ICD-10-CM | POA: Diagnosis not present

## 2023-08-18 DIAGNOSIS — M9902 Segmental and somatic dysfunction of thoracic region: Secondary | ICD-10-CM | POA: Diagnosis not present

## 2023-08-18 DIAGNOSIS — M531 Cervicobrachial syndrome: Secondary | ICD-10-CM | POA: Diagnosis not present

## 2023-08-18 DIAGNOSIS — M9901 Segmental and somatic dysfunction of cervical region: Secondary | ICD-10-CM | POA: Diagnosis not present

## 2023-08-18 DIAGNOSIS — M5032 Other cervical disc degeneration, mid-cervical region, unspecified level: Secondary | ICD-10-CM | POA: Diagnosis not present

## 2023-08-21 DIAGNOSIS — M531 Cervicobrachial syndrome: Secondary | ICD-10-CM | POA: Diagnosis not present

## 2023-08-21 DIAGNOSIS — M5032 Other cervical disc degeneration, mid-cervical region, unspecified level: Secondary | ICD-10-CM | POA: Diagnosis not present

## 2023-08-21 DIAGNOSIS — M9901 Segmental and somatic dysfunction of cervical region: Secondary | ICD-10-CM | POA: Diagnosis not present

## 2023-08-21 DIAGNOSIS — M9902 Segmental and somatic dysfunction of thoracic region: Secondary | ICD-10-CM | POA: Diagnosis not present

## 2023-08-25 DIAGNOSIS — M9902 Segmental and somatic dysfunction of thoracic region: Secondary | ICD-10-CM | POA: Diagnosis not present

## 2023-08-25 DIAGNOSIS — M531 Cervicobrachial syndrome: Secondary | ICD-10-CM | POA: Diagnosis not present

## 2023-08-25 DIAGNOSIS — M5032 Other cervical disc degeneration, mid-cervical region, unspecified level: Secondary | ICD-10-CM | POA: Diagnosis not present

## 2023-08-25 DIAGNOSIS — M9901 Segmental and somatic dysfunction of cervical region: Secondary | ICD-10-CM | POA: Diagnosis not present

## 2023-08-28 DIAGNOSIS — E1142 Type 2 diabetes mellitus with diabetic polyneuropathy: Secondary | ICD-10-CM | POA: Diagnosis not present

## 2023-08-28 DIAGNOSIS — I10 Essential (primary) hypertension: Secondary | ICD-10-CM | POA: Diagnosis not present

## 2023-08-29 DIAGNOSIS — M542 Cervicalgia: Secondary | ICD-10-CM | POA: Diagnosis not present

## 2023-09-19 DIAGNOSIS — M542 Cervicalgia: Secondary | ICD-10-CM | POA: Diagnosis not present

## 2023-10-09 DIAGNOSIS — M542 Cervicalgia: Secondary | ICD-10-CM | POA: Diagnosis not present

## 2023-10-15 DIAGNOSIS — H2521 Age-related cataract, morgagnian type, right eye: Secondary | ICD-10-CM | POA: Diagnosis not present

## 2023-10-15 DIAGNOSIS — H211X1 Other vascular disorders of iris and ciliary body, right eye: Secondary | ICD-10-CM | POA: Diagnosis not present

## 2023-10-15 DIAGNOSIS — H353124 Nonexudative age-related macular degeneration, left eye, advanced atrophic with subfoveal involvement: Secondary | ICD-10-CM | POA: Diagnosis not present

## 2023-10-20 DIAGNOSIS — M542 Cervicalgia: Secondary | ICD-10-CM | POA: Diagnosis not present

## 2023-11-03 DIAGNOSIS — M542 Cervicalgia: Secondary | ICD-10-CM | POA: Diagnosis not present

## 2024-03-10 ENCOUNTER — Inpatient Hospital Stay: Admitting: Hematology

## 2024-03-10 ENCOUNTER — Inpatient Hospital Stay
